# Patient Record
Sex: Male | Born: 1945
Health system: Southern US, Community
[De-identification: ages and names within clinical notes are randomized; demographics above are authoritative.]

## PROBLEM LIST (undated history)

## (undated) DIAGNOSIS — M8580 Other specified disorders of bone density and structure, unspecified site: Secondary | ICD-10-CM

## (undated) DIAGNOSIS — I10 Essential (primary) hypertension: Secondary | ICD-10-CM

## (undated) DIAGNOSIS — M898X9 Other specified disorders of bone, unspecified site: Secondary | ICD-10-CM

## (undated) DIAGNOSIS — M1990 Unspecified osteoarthritis, unspecified site: Secondary | ICD-10-CM

## (undated) HISTORY — DX: Unspecified osteoarthritis, unspecified site: M19.90

## (undated) HISTORY — PX: EYE SURGERY: SHX253

## (undated) HISTORY — DX: Other specified disorders of bone, unspecified site: M89.8X9

## (undated) HISTORY — DX: Other specified disorders of bone density and structure, unspecified site: M85.80

---

## 2004-05-24 ENCOUNTER — Ambulatory Visit: Payer: Self-pay | Admitting: Ophthalmology

## 2007-04-30 ENCOUNTER — Ambulatory Visit: Payer: Self-pay | Admitting: Ophthalmology

## 2011-10-08 ENCOUNTER — Ambulatory Visit: Payer: Self-pay | Admitting: Internal Medicine

## 2011-10-08 IMAGING — US US EXTREM LOW VENOUS*L*
1 series · 14 of 24 positions shown · non-contrast
Comparison: none

REASON FOR EXAM: STAT CR [PHONE_NUMBER] good until 7pm Left leg pain
cellulitis edema Eval for DVT
COMMENTS:

[Series 1: us extrem low venous*left* · 0.10mm/px · 14 of 43 slices shown]
[im 1/43]
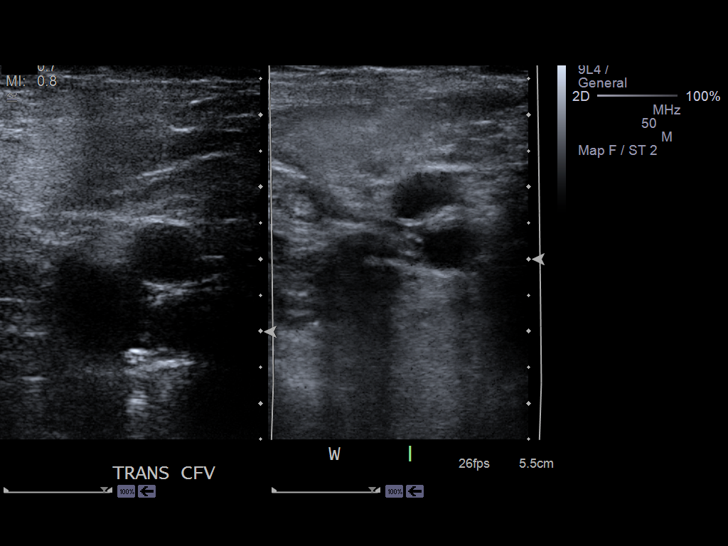
[im 4/43]
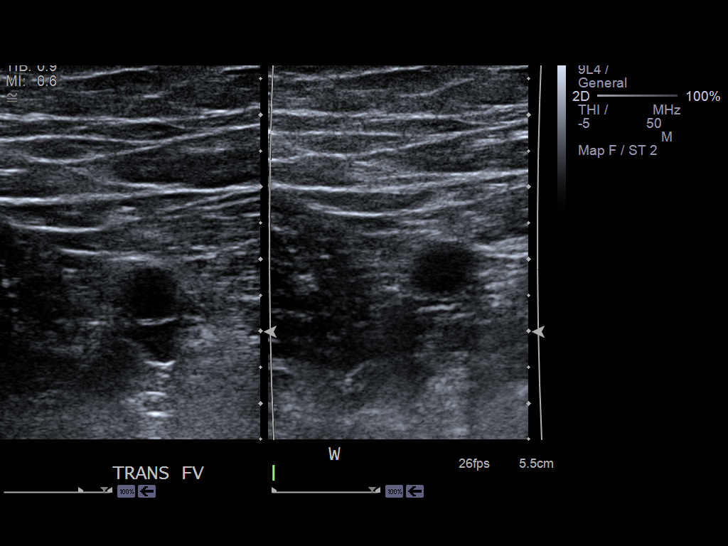
[im 8/43]
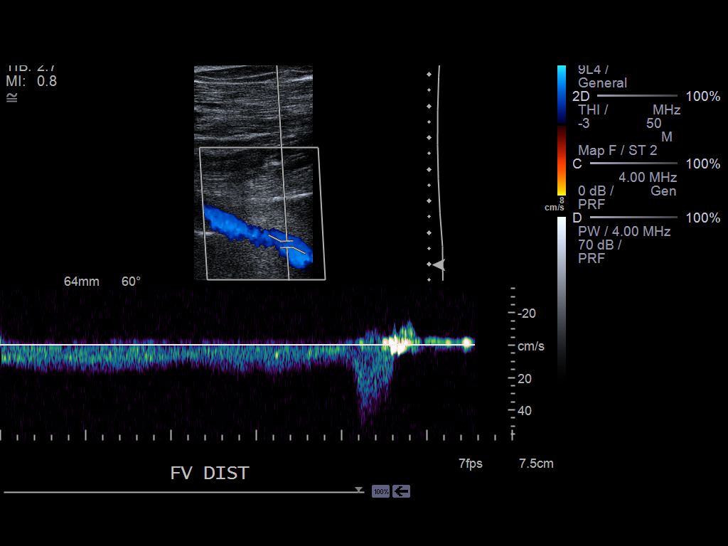
[im 11/43]
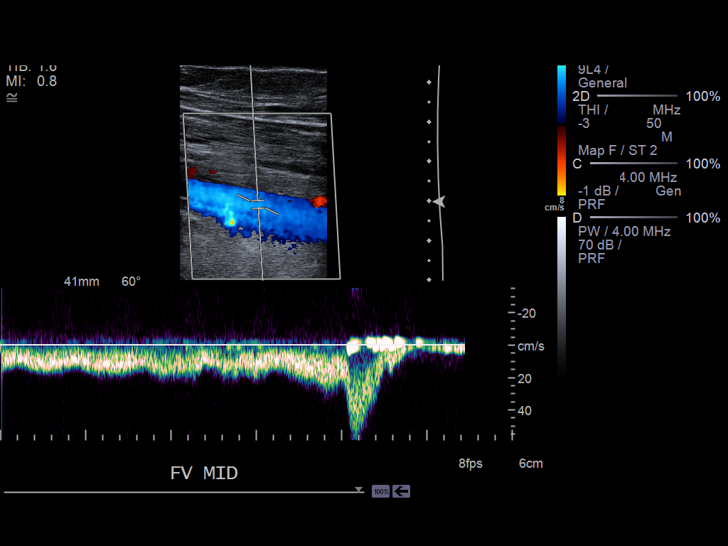
[im 13/43]
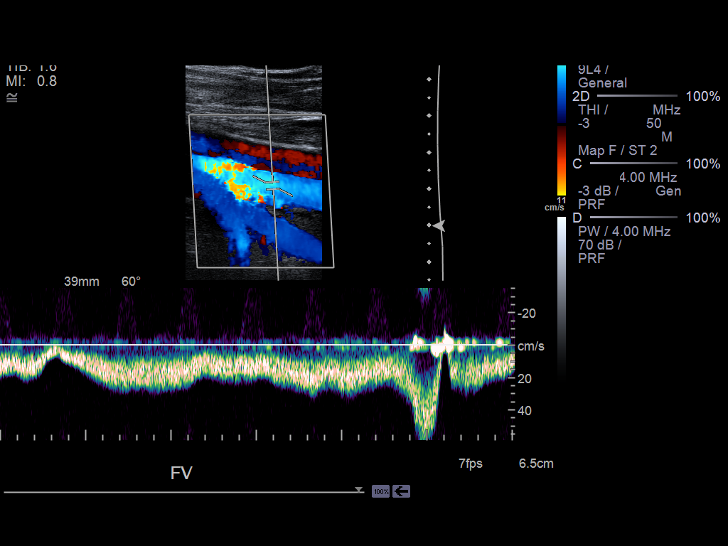
[im 17/43]
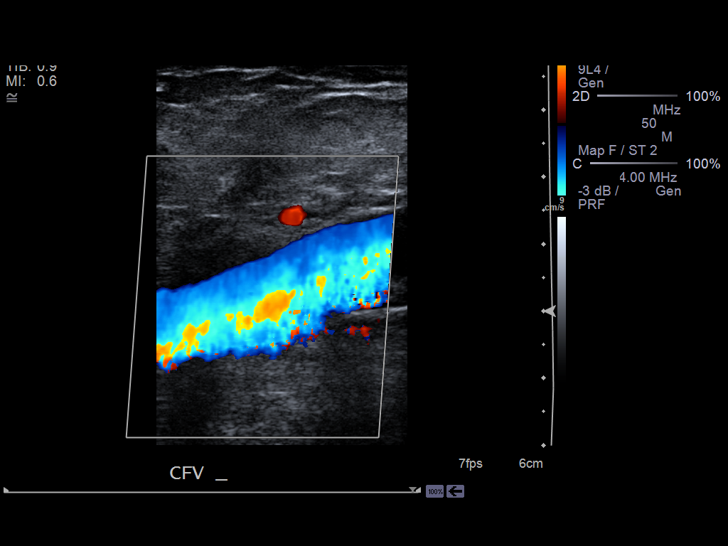
[im 21/43]
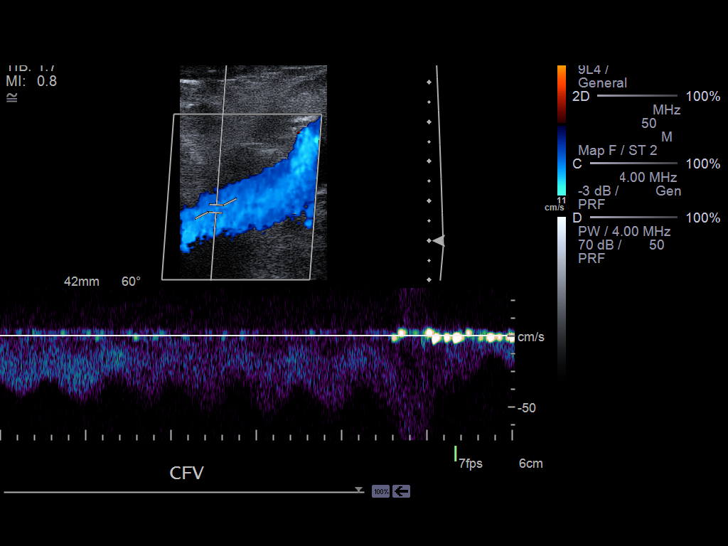
[im 22/43]
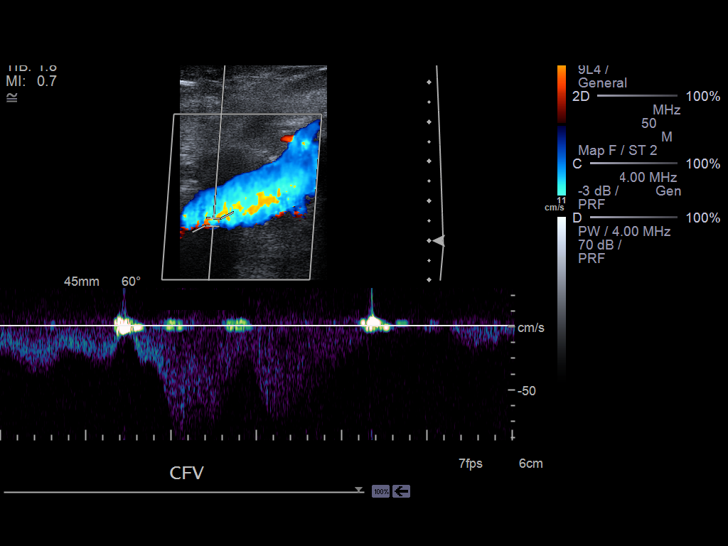
[im 26/43]
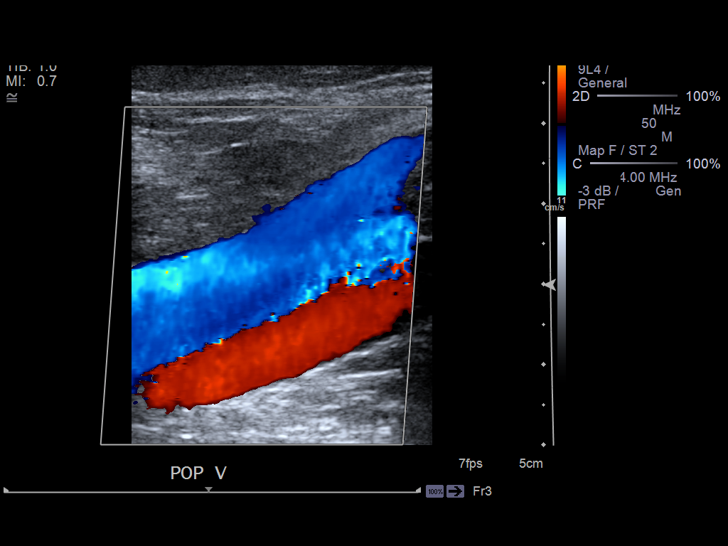
[im 30/43]
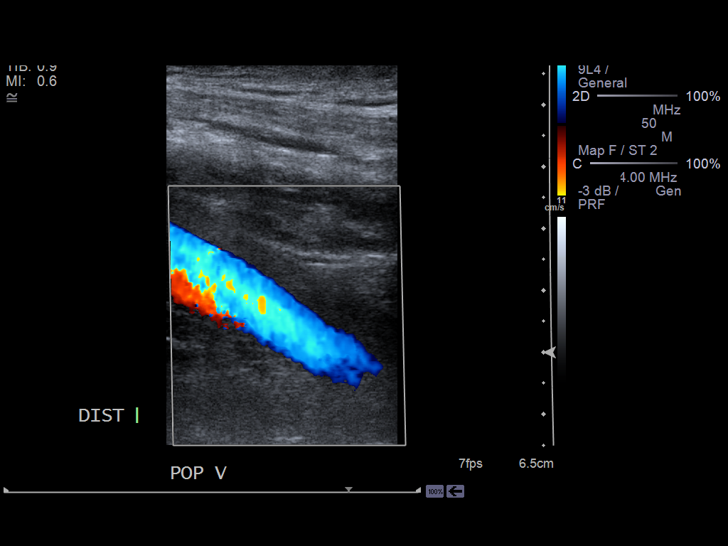
[im 33/43]
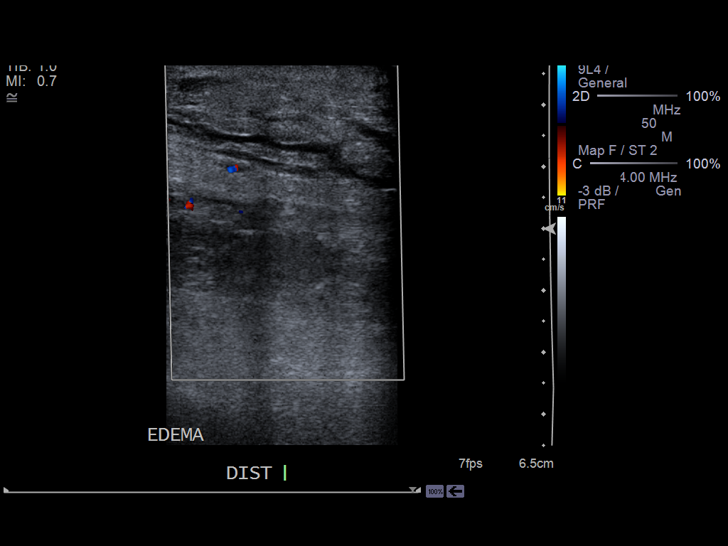
[im 35/43]
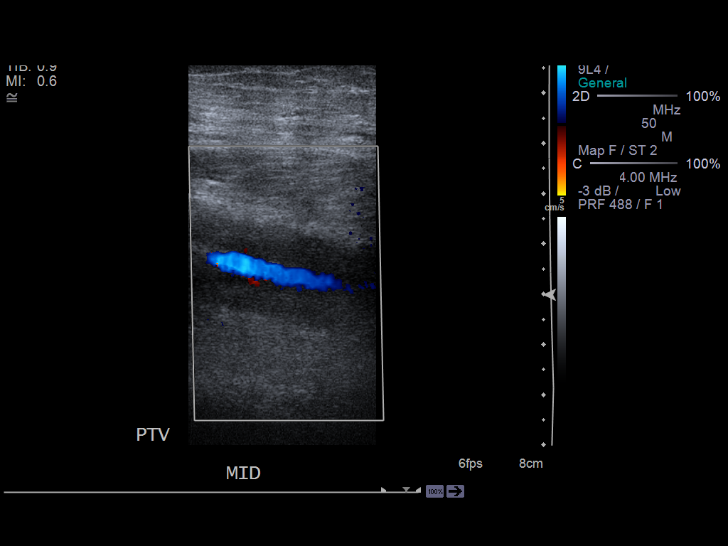
[im 39/43]
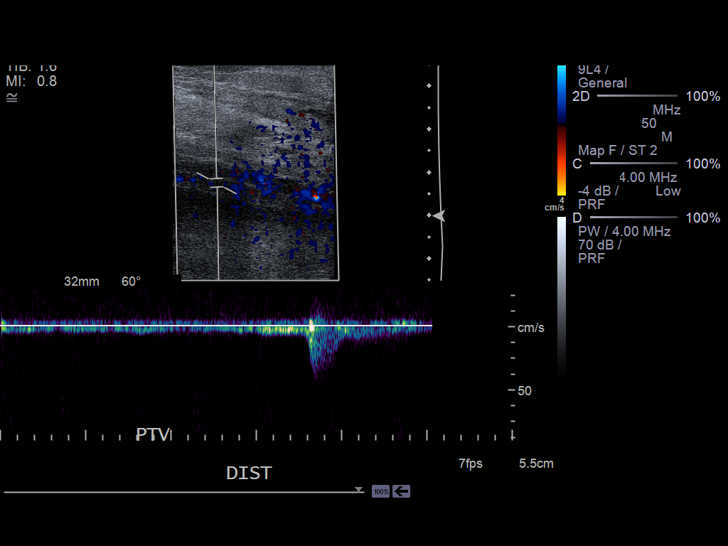
[im 43/43]
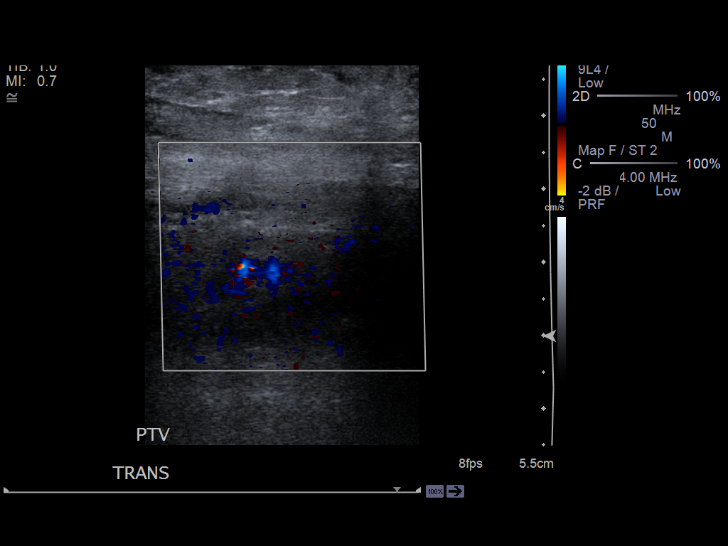

[14 of 24 positions shown; findings below may reference images not displayed]

PROCEDURE:     US  - US DOPPLER LOW EXTR LEFT  - [DATE]  [DATE]

RESULT:     Technique: Gray scale, Duplex color flow and SPECTRAL waveform
imaging was performed of the deep venous structures of the LEFT lower
extremity.

There is not evidence of increased echogenicity, non- compressibility,
abnormal waveform or abnormal grayscale flow with the interrogated deep
venous structures of the LEFT lower extremity. There is appropriate response
to Valsalva and augmentation within the interrogated vessels.
IMPRESSION: 1. No sonographic evidence of a deep venous thrombus within the interrogated
vessels of the LEFT lower extremity.

## 2017-12-24 DIAGNOSIS — K429 Umbilical hernia without obstruction or gangrene: Secondary | ICD-10-CM | POA: Insufficient documentation

## 2017-12-24 DIAGNOSIS — I872 Venous insufficiency (chronic) (peripheral): Secondary | ICD-10-CM | POA: Insufficient documentation

## 2017-12-24 DIAGNOSIS — I10 Essential (primary) hypertension: Secondary | ICD-10-CM | POA: Insufficient documentation

## 2018-02-05 ENCOUNTER — Other Ambulatory Visit (INDEPENDENT_AMBULATORY_CARE_PROVIDER_SITE_OTHER): Payer: Self-pay | Admitting: Nurse Practitioner

## 2018-02-05 ENCOUNTER — Other Ambulatory Visit (INDEPENDENT_AMBULATORY_CARE_PROVIDER_SITE_OTHER): Payer: Self-pay | Admitting: Vascular Surgery

## 2018-02-05 ENCOUNTER — Other Ambulatory Visit (INDEPENDENT_AMBULATORY_CARE_PROVIDER_SITE_OTHER): Payer: Self-pay | Admitting: Podiatry

## 2018-02-05 DIAGNOSIS — L97919 Non-pressure chronic ulcer of unspecified part of right lower leg with unspecified severity: Secondary | ICD-10-CM

## 2018-02-05 DIAGNOSIS — L97929 Non-pressure chronic ulcer of unspecified part of left lower leg with unspecified severity: Secondary | ICD-10-CM

## 2018-02-05 DIAGNOSIS — R0989 Other specified symptoms and signs involving the circulatory and respiratory systems: Secondary | ICD-10-CM

## 2018-02-05 DIAGNOSIS — I83029 Varicose veins of left lower extremity with ulcer of unspecified site: Secondary | ICD-10-CM

## 2018-02-07 ENCOUNTER — Ambulatory Visit (INDEPENDENT_AMBULATORY_CARE_PROVIDER_SITE_OTHER): Payer: Medicare Other

## 2018-02-07 ENCOUNTER — Ambulatory Visit (INDEPENDENT_AMBULATORY_CARE_PROVIDER_SITE_OTHER): Payer: Medicare Other | Admitting: Nurse Practitioner

## 2018-02-07 ENCOUNTER — Encounter (INDEPENDENT_AMBULATORY_CARE_PROVIDER_SITE_OTHER): Payer: Self-pay | Admitting: Nurse Practitioner

## 2018-02-07 VITALS — BP 168/71 | HR 86 | Resp 19 | Ht 71.0 in | Wt 223.0 lb

## 2018-02-07 DIAGNOSIS — R0989 Other specified symptoms and signs involving the circulatory and respiratory systems: Secondary | ICD-10-CM

## 2018-02-07 DIAGNOSIS — I10 Essential (primary) hypertension: Secondary | ICD-10-CM | POA: Diagnosis not present

## 2018-02-07 DIAGNOSIS — I83029 Varicose veins of left lower extremity with ulcer of unspecified site: Secondary | ICD-10-CM

## 2018-02-07 DIAGNOSIS — I83023 Varicose veins of left lower extremity with ulcer of ankle: Secondary | ICD-10-CM

## 2018-02-07 DIAGNOSIS — I872 Venous insufficiency (chronic) (peripheral): Secondary | ICD-10-CM

## 2018-02-07 DIAGNOSIS — L97329 Non-pressure chronic ulcer of left ankle with unspecified severity: Secondary | ICD-10-CM | POA: Diagnosis not present

## 2018-02-07 DIAGNOSIS — L97929 Non-pressure chronic ulcer of unspecified part of left lower leg with unspecified severity: Secondary | ICD-10-CM

## 2018-02-11 ENCOUNTER — Encounter (INDEPENDENT_AMBULATORY_CARE_PROVIDER_SITE_OTHER): Payer: Self-pay | Admitting: Nurse Practitioner

## 2018-02-11 DIAGNOSIS — I83023 Varicose veins of left lower extremity with ulcer of ankle: Secondary | ICD-10-CM | POA: Insufficient documentation

## 2018-02-11 DIAGNOSIS — L97329 Non-pressure chronic ulcer of left ankle with unspecified severity: Secondary | ICD-10-CM | POA: Insufficient documentation

## 2018-02-11 NOTE — Progress Notes (Signed)
Subjective:    Patient ID: Brandon Hooper, male    DOB: 07-27-1945, 72 y.o.   MRN: [id number] Chief Complaint  Patient presents with  . New Patient (Initial Visit)    ABI consult    HPI  Brandon Hooper is a 72 y.o. male that is seen for evaluation of leg pain and swelling associated with new onset ulceration. The patient first noticed the swelling remotely. The swelling is associated with pain and discoloration. The pain and swelling worsens with prolonged dependency and improves with elevation. The pain is unrelated to activity.  The patient notes that in the morning the legs are better but the leg symptoms worsened throughout the course of the day. The patient has also noted a progressive worsening of the discoloration in the ankle and shin area.   The patient has had a previous history of venous ulceration.  He recalls that he has had venous ulcerations of the right lower extremity in 1989.  He had a subsequent ulceration in 2013 on the left lower extremity and his most recent being in 2019.  He states that he is worn bilateral medical grade 1 compression stockings since his ulceration in 2013  The patient notes that an ulcer has developed acutely without specific trauma and since it occurred it has been very slow to heal.  There is a moderate amount of drainage associated with the open area.  The wound is also very painful.  The patient states that he utilize unna wraps to help heal his most recent ulceration and 2016.  However subsequently after healing he had a another ulceration.  The patient denies claudication symptoms or rest pain symptoms.  The patient denies DJD and LS spine disease.  The patient has not had any past angiography, interventions or vascular surgery.  Elevation makes the leg symptoms better, dependency makes them much worse. The patient denies any recent changes in medications.  The patient denies a history of DVT or PE. There is no prior history of  phlebitis. There is no history of primary lymphedema.  No history of malignancies. No history of trauma or groin or pelvic surgery. There is no history of radiation treatment to the groin or pelvis   The patient underwent bilateral ABIs today which revealed a right ABI of 1.41 with a TBI of 0.85.  There is a left ABI of 1.27 with a TBI of 0.76.  The right tibial waveforms are triphasic.  The left anterior tibial artery has biphasic waveforms and the posterior tibial artery has triphasic waveforms.    Past Medical History:  Diagnosis Date  . Arthritis   . Bone loss     Past Surgical History:  Procedure Laterality Date  . EYE SURGERY Bilateral    Feb 2006 and then in Jan 2009    Social History   Socioeconomic History  . Marital status: Married    Spouse name: Not on file  . Number of children: Not on file  . Years of education: Not on file  . Highest education level: Not on file  Occupational History  . Not on file  Social Needs  . Financial resource strain: Not on file  . Food insecurity:    Worry: Not on file    Inability: Not on file  . Transportation needs:    Medical: Not on file    Non-medical: Not on file  Tobacco Use  . Smoking status: Never Smoker  . Smokeless tobacco: Never Used  Substance and Sexual Activity  .  Alcohol use: Not Currently  . Drug use: Not on file  . Sexual activity: Not on file  Lifestyle  . Physical activity:    Days per week: Not on file    Minutes per session: Not on file  . Stress: Not on file  Relationships  . Social connections:    Talks on phone: Not on file    Gets together: Not on file    Attends religious service: Not on file    Active member of club or organization: Not on file    Attends meetings of clubs or organizations: Not on file    Relationship status: Not on file  . Intimate partner violence:    Fear of current or ex partner: Not on file    Emotionally abused: Not on file    Physically abused: Not on file     Forced sexual activity: Not on file  Other Topics Concern  . Not on file  Social History Narrative  . Not on file    Family History  Problem Relation Age of Onset  . Diabetes Father     Allergies  Allergen Reactions  . Erythromycin Hives  . Ofloxacin     Other reaction(s): Other (See Comments) "Arthritic pain". The patient reported 5 years of left hip pain when he last took ofloxacin. Likely could try another fluoroquinolone.   . Cephalexin Rash    The patient previously tolerated cephalexin in 2013 and reported in 2019 that he developed redness and itching of his knees while on cephalexin. Not entirely clear it was a true allergic reaction.   . Clindamycin Hcl Rash  . Doxycycline Calcium Rash  . Sulfamethoxazole-Trimethoprim Rash  . Tetracycline Rash     Review of Systems   Review of Systems: Negative Unless Checked Constitutional: [] Weight loss  [] Fever  [] Chills Cardiac: [] Chest pain   []  Atrial Fibrillation  [] Palpitations   [] Shortness of breath when laying flat   [] Shortness of breath with exertion. Vascular:  [] Pain in legs with walking   [] Pain in legs with standing  [] History of DVT   [] Phlebitis   [x] Swelling in legs   [x] Varicose veins   [] Non-healing ulcers Pulmonary:   [] Uses home oxygen   [] Productive cough   [] Hemoptysis   [] Wheeze  [] COPD   [] Asthma Neurologic:  [] Dizziness   [] Seizures   [] History of stroke   [] History of TIA  [] Aphasia   [] Vissual changes   [] Weakness or numbness in arm   [] Weakness or numbness in leg Musculoskeletal:   [] Joint swelling   [] Joint pain   [] Low back pain  []  History of Knee Replacement Hematologic:  [] Easy bruising  [] Easy bleeding   [] Hypercoagulable state   [] Anemic Gastrointestinal:  [] Diarrhea   [] Vomiting  [] Gastroesophageal reflux/heartburn   [] Difficulty swallowing. Genitourinary:  [] Chronic kidney disease   [] Difficult urination  [] Anuric   [] Blood in urine Skin:  [] Rashes   [x] Ulcers  Psychological:  [] History of  anxiety   []  History of major depression  []  Memory Difficulties     Objective:   Physical Exam  BP (!) 168/71 (BP Location: Right Arm, Patient Position: Sitting)   Pulse 86   Resp 19   Ht 5\' 11"  (1.803 m)   Wt 223 lb (101.2 kg)   BMI 31.10 kg/m   Gen: WD/WN, NAD Head: [place]/AT, No temporalis wasting.  Ear/Nose/Throat: Hearing grossly intact, nares w/o erythema or drainage Eyes: PER, EOMI, sclera nonicteric.  Neck: Supple, no masses.  No JVD.  Pulmonary:  Good air movement, no use of accessory muscles.  Cardiac: RRR Vascular: 2-3+ edema of the left leg with severe venous changes of the left leg.  Venous ulcer noted in the ankle area on the left, noninfected  Venous stasis dermatitis with ulcers present on the left Vessel Right Left  Radial Palpable Palpable  Dorsalis Pedis Palpable Palpable  Posterior Tibial Palpable Palpable   Gastrointestinal: soft, non-distended. No guarding/no peritoneal signs.  Musculoskeletal: M/S 5/5 throughout.  No deformity or atrophy.  Neurologic: Pain and light touch intact in extremities.  Symmetrical.  Speech is fluent. Motor exam as listed above. Psychiatric: Judgment intact, Mood & affect appropriate for pt's clinical situation. Dermatologic: No Venous rashes. No Ulcers Noted.  No changes consistent with cellulitis. Lymph : No Cervical lymphadenopathy, no lichenification or skin changes of chronic lymphedema.      Assessment & Plan:   1. Venous ulcer of ankle, left (HCC) No surgery or intervention at this point in time.    I have had a long discussion with the patient regarding venous insufficiency and why it  causes symptoms, specifically venous ulceration . I have discussed with the patient the chronic skin changes that accompany venous insufficiency and the long term sequela such as infection and recurring  Ulceration.  In addition, behavioral modification including several periods of elevation of the lower extremities during the day will  be continued. Achieving a position with the ankles at heart level was stressed to the patient  The patient is instructed to begin routine exercise, especially walking on a daily basis  Patient should undergo duplex ultrasound of the venous system to ensure that DVT or reflux is not present.  Following the review of the ultrasound the patient will follow up in one week to reassess the degree of swelling and the control that Unna therapy is offering.   The patient can be assessed for graduated compression stockings or wraps as well as a Lymph Pump once the ulcers are healed.  - VAS [place] LOWER EXTREMITY VENOUS REFLUX; Future  2. Essential hypertension Continue antihypertensive medications as already ordered, these medications have been reviewed and there are no changes at this time.   3. Venous insufficiency See above  ABI test today suggest that his recurrent ulcerations are due to venous disease and not arterial disease.  We will follow the plan outlined above.  Current Outpatient Medications on File Prior to Visit  Medication Sig Dispense Refill  . diphenhydrAMINE (BENADRYL) 25 MG tablet Take 25 mg by mouth every 6 (six) hours as needed.    Marland Kitchen guaiFENesin (MUCINEX) 600 MG 12 hr tablet Take by mouth 2 (two) times daily.     . pseudoephedrine (SUDAFED) 120 MG 12 hr tablet Take 120 mg by mouth daily as needed.      No current facility-administered medications on file prior to visit.     There are no Patient Instructions on file for this visit. No follow-ups on file.   Georgiana Spinner, NP  This note was completed with Office manager.  Any errors are purely unintentional.

## 2018-02-26 ENCOUNTER — Ambulatory Visit (INDEPENDENT_AMBULATORY_CARE_PROVIDER_SITE_OTHER): Payer: Medicare Other

## 2018-02-26 ENCOUNTER — Ambulatory Visit (INDEPENDENT_AMBULATORY_CARE_PROVIDER_SITE_OTHER): Payer: Medicare Other | Admitting: Nurse Practitioner

## 2018-02-26 ENCOUNTER — Encounter (INDEPENDENT_AMBULATORY_CARE_PROVIDER_SITE_OTHER): Payer: Medicare Other

## 2018-02-26 ENCOUNTER — Encounter (INDEPENDENT_AMBULATORY_CARE_PROVIDER_SITE_OTHER): Payer: Self-pay | Admitting: Nurse Practitioner

## 2018-02-26 VITALS — BP 156/75 | HR 81 | Resp 16 | Ht 70.0 in | Wt 222.0 lb

## 2018-02-26 DIAGNOSIS — L97329 Non-pressure chronic ulcer of left ankle with unspecified severity: Secondary | ICD-10-CM

## 2018-02-26 DIAGNOSIS — I872 Venous insufficiency (chronic) (peripheral): Secondary | ICD-10-CM | POA: Diagnosis not present

## 2018-02-26 DIAGNOSIS — I83023 Varicose veins of left lower extremity with ulcer of ankle: Secondary | ICD-10-CM | POA: Diagnosis not present

## 2018-02-26 DIAGNOSIS — I10 Essential (primary) hypertension: Secondary | ICD-10-CM | POA: Diagnosis not present

## 2018-02-26 NOTE — Progress Notes (Signed)
Subjective:    Patient ID: Brandon Hooper, male    DOB: [birth year], 72 y.o.   MRN: [id number] Chief Complaint  Patient presents with  . Follow-up    ultrasound follow up    HPI  Brandon Hooper is a 72 y.o. male that presents today for evaluation of swelling of his left lower extremity.  He has a venous ulceration that has been recurrent in nature.  He states that he originally had a venous ulcer in May and did Unna wrap therapy with Dr. Orland Jarred.  He stated that his wound healed however in August it resumed.  The patient previously had bilateral ABIs which were normal.  The patient is currently doing a dressing to the wound site and wearing medical grade 1 compression stockings 20 to 30 mmHg.  Despite the conservative therapy he still has continued weeping and ulceration of his left lower extremity.  He does note that it is an ulcer that developed acutely without specific trauma and it is been very slow to heal.  There is a moderate amount of drainage with open area.  The wound is also very painful.  Patient underwent bilateral venous reflux study today which revealed reflux in the bilateral common femoral veins.  There is no DVT present no superficial venous thrombosis present. Past Medical History:  Diagnosis Date  . Arthritis   . Bone loss     Past Surgical History:  Procedure Laterality Date  . EYE SURGERY Bilateral    Feb 2006 and then in Jan 2009    Social History   Socioeconomic History  . Marital status: Married    Spouse name: Not on file  . Number of children: Not on file  . Years of education: Not on file  . Highest education level: Not on file  Occupational History  . Not on file  Social Needs  . Financial resource strain: Not on file  . Food insecurity:    Worry: Not on file    Inability: Not on file  . Transportation needs:    Medical: Not on file    Non-medical: Not on file  Tobacco Use  . Smoking status: Never Smoker  . Smokeless tobacco: Never Used   Substance and Sexual Activity  . Alcohol use: Not Currently  . Drug use: Not on file  . Sexual activity: Not on file  Lifestyle  . Physical activity:    Days per week: Not on file    Minutes per session: Not on file  . Stress: Not on file  Relationships  . Social connections:    Talks on phone: Not on file    Gets together: Not on file    Attends religious service: Not on file    Active member of club or organization: Not on file    Attends meetings of clubs or organizations: Not on file    Relationship status: Not on file  . Intimate partner violence:    Fear of current or ex partner: Not on file    Emotionally abused: Not on file    Physically abused: Not on file    Forced sexual activity: Not on file  Other Topics Concern  . Not on file  Social History Narrative  . Not on file    Family History  Problem Relation Age of Onset  . Diabetes Father     Allergies  Allergen Reactions  . Erythromycin Hives  . Ofloxacin     Other reaction(s): Other (See Comments) "Arthritic pain".  The patient reported 5 years of left hip pain when he last took ofloxacin. Likely could try another fluoroquinolone.   . Cephalexin Rash    The patient previously tolerated cephalexin in 2013 and reported in 2019 that he developed redness and itching of his knees while on cephalexin. Not entirely clear it was a true allergic reaction.   . Clindamycin Hcl Rash  . Doxycycline Calcium Rash  . Sulfamethoxazole-Trimethoprim Rash  . Tetracycline Rash     Review of Systems   Review of Systems: Negative Unless Checked Constitutional: [] Weight loss  [] Fever  [] Chills Cardiac: [] Chest pain   []  Atrial Fibrillation  [] Palpitations   [] Shortness of breath when laying flat   [] Shortness of breath with exertion. Vascular:  [] Pain in legs with walking   [] Pain in legs with standing  [] History of DVT   [] Phlebitis   [x] Swelling in legs   [] Varicose veins   [x] Non-healing ulcers Pulmonary:   [] Uses home  oxygen   [] Productive cough   [] Hemoptysis   [] Wheeze  [] COPD   [] Asthma Neurologic:  [] Dizziness   [] Seizures   [] History of stroke   [] History of TIA  [] Aphasia   [] Vissual changes   [] Weakness or numbness in arm   [] Weakness or numbness in leg Musculoskeletal:   [] Joint swelling   [] Joint pain   [] Low back pain  []  History of Knee Replacement Hematologic:  [] Easy bruising  [] Easy bleeding   [] Hypercoagulable state   [] Anemic Gastrointestinal:  [] Diarrhea   [] Vomiting  [] Gastroesophageal reflux/heartburn   [] Difficulty swallowing. Genitourinary:  [] Chronic kidney disease   [] Difficult urination  [] Anuric   [] Blood in urine Skin:  [] Rashes   [] Ulcers  Psychological:  [] History of anxiety   []  History of major depression  []  Memory Difficulties     Objective:   Physical Exam  BP (!) 156/75 (BP Location: Right Arm)   Pulse 81   Resp 16   Ht 5\' 10"  (1.778 m)   Wt 222 lb (100.7 kg)   BMI 31.85 kg/m   Gen: WD/WN, NAD Head: [place]/AT, No temporalis wasting.  Ear/Nose/Throat: Hearing grossly intact, nares w/o erythema or drainage Eyes: PER, EOMI, sclera nonicteric.  Neck: Supple, no masses.  No JVD.  Pulmonary:  Good air movement, no use of accessory muscles.  Cardiac: RRR Vascular: venous ulcer on left lower extremity.  3+ pitting edema on both lower extremity. Vessel Right Left  Radial Palpable Palpable  Dorsalis Pedis Palpable Palpable  Posterior Tibial Palpable Palpable   Gastrointestinal: soft, non-distended. No guarding/no peritoneal signs.  Musculoskeletal: M/S 5/5 throughout.  No deformity or atrophy.  Neurologic: Pain and light touch intact in extremities.  Symmetrical.  Speech is fluent. Motor exam as listed above. Psychiatric: Judgment intact, Mood & affect appropriate for pt's clinical situation. Dermatologic: No Venous rashes. No Ulcers Noted.  No changes consistent with cellulitis. Lymph : No Cervical lymphadenopathy, no lichenification or skin changes of chronic  lymphedema.      Assessment & Plan:   1. Venous ulcer of ankle, left (HCC) No surgery or intervention at this point in time.    I have had a long discussion with the patient regarding venous insufficiency and why it  causes symptoms, specifically venous ulceration . I have discussed with the patient the chronic skin changes that accompany venous insufficiency and the long term sequela such as infection and recurring  ulceration.  Patient will be placed in [employer] which will be changed weekly drainage permitting.  In addition, behavioral modification  including several periods of elevation of the lower extremities during the day will be continued. Achieving a position with the ankles at heart level was stressed to the patient  The patient is instructed to begin routine exercise, especially walking on a daily basis   2. Essential hypertension Continue antihypertensive medications as already ordered, these medications have been reviewed and there are no changes at this time.   3. Venous insufficiency Had a long discussion with the wife and the patient in regards to chronic venous insufficiency and leg swelling.  Following Unna wrap therapy and wound healing we have discussed utilizing updated compression stockings.  He endorses that he has 2 pairs one which is new and 1 of which he has had since 2013.  They are both 20 to 30 mmHg, but he does endorse that one is definitely tighter.  We also spoke about the possibility of lymphedema pump if we find that compression stockings are unable to control the edema alone.   Current Outpatient Medications on File Prior to Visit  Medication Sig Dispense Refill  . diphenhydrAMINE (BENADRYL) 25 MG tablet Take 25 mg by mouth every 6 (six) hours as needed.    Marland Kitchen. guaiFENesin (MUCINEX) 600 MG 12 hr tablet Take by mouth 2 (two) times daily.     . pseudoephedrine (SUDAFED) 120 MG 12 hr tablet Take 120 mg by mouth daily as needed.      No current  facility-administered medications on file prior to visit.     There are no Patient Instructions on file for this visit. No follow-ups on file.   Georgiana SpinnerFallon E , NP  This note was completed with Office managerDragon Dictation.  Any errors are purely unintentional.

## 2018-03-05 ENCOUNTER — Encounter (INDEPENDENT_AMBULATORY_CARE_PROVIDER_SITE_OTHER): Payer: Self-pay

## 2018-03-05 ENCOUNTER — Ambulatory Visit (INDEPENDENT_AMBULATORY_CARE_PROVIDER_SITE_OTHER): Payer: Medicare Other | Admitting: Nurse Practitioner

## 2018-03-05 VITALS — BP 144/71 | HR 80 | Resp 16 | Ht 70.0 in | Wt 226.0 lb

## 2018-03-05 DIAGNOSIS — I83023 Varicose veins of left lower extremity with ulcer of ankle: Secondary | ICD-10-CM | POA: Diagnosis not present

## 2018-03-05 DIAGNOSIS — L97329 Non-pressure chronic ulcer of left ankle with unspecified severity: Secondary | ICD-10-CM

## 2018-03-05 NOTE — Progress Notes (Signed)
History of Present Illness  There is no documented history at this time  Assessments & Plan   There are no diagnoses linked to this encounter.    Additional instructions  Subjective:  Patient presents with venous ulcer of the Left lower extremity.    Procedure:  3 layer unna wrap was placed Left lower extremity.   Plan:   Follow up in one week.  

## 2018-03-11 ENCOUNTER — Encounter (INDEPENDENT_AMBULATORY_CARE_PROVIDER_SITE_OTHER): Payer: Self-pay

## 2018-03-11 ENCOUNTER — Ambulatory Visit (INDEPENDENT_AMBULATORY_CARE_PROVIDER_SITE_OTHER): Payer: Medicare Other | Admitting: Nurse Practitioner

## 2018-03-11 VITALS — BP 161/71 | HR 73 | Resp 18 | Ht 71.0 in | Wt 223.0 lb

## 2018-03-11 DIAGNOSIS — I83023 Varicose veins of left lower extremity with ulcer of ankle: Secondary | ICD-10-CM | POA: Diagnosis not present

## 2018-03-11 DIAGNOSIS — L97329 Non-pressure chronic ulcer of left ankle with unspecified severity: Secondary | ICD-10-CM | POA: Diagnosis not present

## 2018-03-11 NOTE — Progress Notes (Signed)
History of Present Illness  There is no documented history at this time  Assessments & Plan   There are no diagnoses linked to this encounter.    Additional instructions  Subjective:  Patient presents with venous ulcer of the Left lower extremity.    Procedure:  3 layer unna wrap was placed Left lower extremity.   Plan:   Follow up in one week.  

## 2018-03-12 ENCOUNTER — Encounter (INDEPENDENT_AMBULATORY_CARE_PROVIDER_SITE_OTHER): Payer: Medicare Other

## 2018-03-19 ENCOUNTER — Ambulatory Visit (INDEPENDENT_AMBULATORY_CARE_PROVIDER_SITE_OTHER): Payer: Medicare Other | Admitting: Nurse Practitioner

## 2018-03-19 ENCOUNTER — Encounter (INDEPENDENT_AMBULATORY_CARE_PROVIDER_SITE_OTHER): Payer: Self-pay | Admitting: Nurse Practitioner

## 2018-03-19 VITALS — BP 148/71 | HR 87 | Resp 18 | Wt 223.0 lb

## 2018-03-19 DIAGNOSIS — I83023 Varicose veins of left lower extremity with ulcer of ankle: Secondary | ICD-10-CM | POA: Diagnosis not present

## 2018-03-19 DIAGNOSIS — L97329 Non-pressure chronic ulcer of left ankle with unspecified severity: Secondary | ICD-10-CM | POA: Diagnosis not present

## 2018-03-19 NOTE — Progress Notes (Signed)
History of Present Illness  There is no documented history at this time  Assessments & Plan   There are no diagnoses linked to this encounter.    Additional instructions  Subjective:  Patient presents with venous ulcer of the Left lower extremity.    Procedure:  3 layer unna wrap was placed Left lower extremity.   Plan:   Follow up in one week.  

## 2018-03-25 ENCOUNTER — Encounter (INDEPENDENT_AMBULATORY_CARE_PROVIDER_SITE_OTHER): Payer: Self-pay | Admitting: Nurse Practitioner

## 2018-03-25 ENCOUNTER — Ambulatory Visit (INDEPENDENT_AMBULATORY_CARE_PROVIDER_SITE_OTHER): Payer: Medicare Other | Admitting: Nurse Practitioner

## 2018-03-25 VITALS — BP 155/77 | HR 86 | Ht 68.0 in | Wt 224.0 lb

## 2018-03-25 DIAGNOSIS — I872 Venous insufficiency (chronic) (peripheral): Secondary | ICD-10-CM | POA: Diagnosis not present

## 2018-03-25 DIAGNOSIS — I83023 Varicose veins of left lower extremity with ulcer of ankle: Secondary | ICD-10-CM | POA: Diagnosis not present

## 2018-03-25 DIAGNOSIS — I10 Essential (primary) hypertension: Secondary | ICD-10-CM

## 2018-03-25 DIAGNOSIS — L97329 Non-pressure chronic ulcer of left ankle with unspecified severity: Secondary | ICD-10-CM | POA: Diagnosis not present

## 2018-03-25 NOTE — Progress Notes (Signed)
Subjective:    Patient ID: Brandon Hooper, male    DOB: [birth year], 72 y.o.   MRN: [id number] Chief Complaint  Patient presents with  . Follow-up    unna check    HPI  Brandon Hooper is a 72 y.o. male Patient is seen for follow up evaluation of leg pain and swelling associated with venous ulceration. The patient was recently seen here and started on Unna boot therapy.  The patient states that he feels that the wounds are healing as well as the swelling is getting under better control.  From the last time that the patient was seen the wounds definitely do appear much better in size. The patient states that they have been elevating as much as possible. The patient denies any recent changes in medications.  The patient denies a history of DVT or PE. There is no prior history of phlebitis. There is no history of primary lymphedema.  No SOB or increased cough.  No sputum production.  No recent episodes of CHF exacerbation.   Past Medical History:  Diagnosis Date  . Arthritis   . Bone loss     Past Surgical History:  Procedure Laterality Date  . EYE SURGERY Bilateral    Feb 2006 and then in Jan 2009    Social History   Socioeconomic History  . Marital status: Married    Spouse name: Not on file  . Number of children: Not on file  . Years of education: Not on file  . Highest education level: Not on file  Occupational History  . Not on file  Social Needs  . Financial resource strain: Not on file  . Food insecurity:    Worry: Not on file    Inability: Not on file  . Transportation needs:    Medical: Not on file    Non-medical: Not on file  Tobacco Use  . Smoking status: Never Smoker  . Smokeless tobacco: Never Used  Substance and Sexual Activity  . Alcohol use: Not Currently  . Drug use: Not on file  . Sexual activity: Not on file  Lifestyle  . Physical activity:    Days per week: Not on file    Minutes per session: Not on file  . Stress: Not on file    Relationships  . Social connections:    Talks on phone: Not on file    Gets together: Not on file    Attends religious service: Not on file    Active member of club or organization: Not on file    Attends meetings of clubs or organizations: Not on file    Relationship status: Not on file  . Intimate partner violence:    Fear of current or ex partner: Not on file    Emotionally abused: Not on file    Physically abused: Not on file    Forced sexual activity: Not on file  Other Topics Concern  . Not on file  Social History Narrative  . Not on file    Family History  Problem Relation Age of Onset  . Diabetes Father     Allergies  Allergen Reactions  . Erythromycin Hives  . Ofloxacin     Other reaction(s): Other (See Comments) "Arthritic pain". The patient reported 5 years of left hip pain when he last took ofloxacin. Likely could try another fluoroquinolone.   . Cephalexin Rash    The patient previously tolerated cephalexin in 2013 and reported in 2019 that he developed redness and  itching of his knees while on cephalexin. Not entirely clear it was a true allergic reaction.   . Clindamycin Hcl Rash  . Doxycycline Calcium Rash  . Sulfamethoxazole-Trimethoprim Rash  . Tetracycline Rash     Review of Systems   Review of Systems: Negative Unless Checked Constitutional: [] Weight loss  [] Fever  [] Chills Cardiac: [] Chest pain   []  Atrial Fibrillation  [] Palpitations   [] Shortness of breath when laying flat   [] Shortness of breath with exertion. Vascular:  [] Pain in legs with walking   [] Pain in legs with standing  [] History of DVT   [] Phlebitis   [x] Swelling in legs   [x] Varicose veins   [] Non-healing ulcers Pulmonary:   [] Uses home oxygen   [] Productive cough   [] Hemoptysis   [] Wheeze  [] COPD   [] Asthma Neurologic:  [] Dizziness   [] Seizures   [] History of stroke   [] History of TIA  [] Aphasia   [] Vissual changes   [] Weakness or numbness in arm   [] Weakness or numbness in  leg Musculoskeletal:   [] Joint swelling   [] Joint pain   [] Low back pain  []  History of Knee Replacement Hematologic:  [] Easy bruising  [] Easy bleeding   [] Hypercoagulable state   [] Anemic Gastrointestinal:  [] Diarrhea   [] Vomiting  [] Gastroesophageal reflux/heartburn   [] Difficulty swallowing. Genitourinary:  [] Chronic kidney disease   [] Difficult urination  [] Anuric   [] Blood in urine Skin:  [] Rashes   [x] Ulcers  Psychological:  [] History of anxiety   []  History of major depression  []  Memory Difficulties     Objective:   Physical Exam  BP (!) 155/77 (BP Location: Left Arm)   Pulse 86   Ht 5\' 8"  (1.727 m)   Wt 224 lb (101.6 kg)   BMI 34.06 kg/m   Gen: WD/WN, NAD Head: [place]/AT, No temporalis wasting.  Ear/Nose/Throat: Hearing grossly intact, nares w/o erythema or drainage Eyes: PER, EOMI, sclera nonicteric.  Neck: Supple, no masses.  No JVD.  Pulmonary:  Good air movement, no use of accessory muscles.  Cardiac: RRR Vascular:  Evidence of old blister under arch of left foot.  Evidence of smaller scattered healing ulcerations along the area of left posterior calf. Vessel Right Left  Radial Palpable Palpable  Dorsalis Pedis Palpable Palpable  Posterior Tibial Palpable Palpable   Gastrointestinal: soft, non-distended. No guarding/no peritoneal signs.  Musculoskeletal: M/S 5/5 throughout.  No deformity or atrophy.  Neurologic: Pain and light touch intact in extremities.  Symmetrical.  Speech is fluent. Motor exam as listed above. Psychiatric: Judgment intact, Mood & affect appropriate for pt's clinical situation. Dermatologic: No Venous rashes. No Ulcers Noted.  No changes consistent with cellulitis. Lymph : No Cervical lymphadenopathy, no lichenification or skin changes of chronic lymphedema.      Assessment & Plan:   1. Venous ulcer of ankle, left (HCC) No surgery or intervention at this point in time.    I have had a long discussion with the patient regarding venous  insufficiency and why it  causes symptoms, specifically venous ulceration . I have discussed with the patient the chronic skin changes that accompany venous insufficiency and the long term sequela such as infection and recurring  ulceration.  Patient will be placed in [employer] which will be changed weekly drainage permitting.  In addition, behavioral modification including several periods of elevation of the lower extremities during the day will be continued. Achieving a position with the ankles at heart level was stressed to the patient  The patient is instructed to begin routine  exercise, especially walking on a daily basis  The patient will undergo weekly wraps for the next 4 weeks.  We will reevaluate the status of his wounds in 4 weeks determine if we can transition to medical grade 1 compression wraps or if the need for continued wrap therapy is necessary.  2. Essential hypertension Continue antihypertensive medications as already ordered, these medications have been reviewed and there are no changes at this time.   3. Venous insufficiency No surgery or intervention at this point in time.    I have reviewed my discussion with the patient regarding venous insufficiency and secondary lymph edema and why it  causes symptoms. I have discussed with the patient the chronic skin changes that accompany these problems and the long term sequela such as ulceration and infection.  Patient will continue wearing graduated compression stockings class 1 (20-30 mmHg) on a daily basis a prescription was given to the patient to keep this updated. The patient will  put the stockings on first thing in the morning and removing them in the evening. The patient is instructed specifically not to sleep in the stockings.  In addition, behavioral modification including elevation during the day will be continued.  Diet and salt restriction was also discussed.      Current Outpatient Medications on File Prior to  Visit  Medication Sig Dispense Refill  . diphenhydrAMINE (BENADRYL) 25 MG tablet Take 25 mg by mouth every 6 (six) hours as needed.    Marland Kitchen guaiFENesin (MUCINEX) 600 MG 12 hr tablet Take by mouth 2 (two) times daily.     . pseudoephedrine (SUDAFED) 120 MG 12 hr tablet Take 120 mg by mouth daily as needed.      No current facility-administered medications on file prior to visit.     There are no Patient Instructions on file for this visit. No follow-ups on file.   Georgiana Spinner, NP  This note was completed with Office manager.  Any errors are purely unintentional.

## 2018-03-26 ENCOUNTER — Ambulatory Visit (INDEPENDENT_AMBULATORY_CARE_PROVIDER_SITE_OTHER): Payer: Medicare Other | Admitting: Nurse Practitioner

## 2018-04-03 ENCOUNTER — Encounter (INDEPENDENT_AMBULATORY_CARE_PROVIDER_SITE_OTHER): Payer: Self-pay | Admitting: Nurse Practitioner

## 2018-04-03 ENCOUNTER — Ambulatory Visit (INDEPENDENT_AMBULATORY_CARE_PROVIDER_SITE_OTHER): Payer: Medicare Other | Admitting: Nurse Practitioner

## 2018-04-03 VITALS — BP 151/71 | HR 80 | Ht 68.0 in | Wt 225.2 lb

## 2018-04-03 DIAGNOSIS — L97329 Non-pressure chronic ulcer of left ankle with unspecified severity: Secondary | ICD-10-CM

## 2018-04-03 DIAGNOSIS — I83023 Varicose veins of left lower extremity with ulcer of ankle: Secondary | ICD-10-CM

## 2018-04-03 NOTE — Progress Notes (Signed)
History of Present Illness  There is no documented history at this time  Assessments & Plan   There are no diagnoses linked to this encounter.    Additional instructions  Subjective:  Patient presents with venous ulcer of the Left lower extremity.    Procedure:  3 layer unna wrap was placed Left lower extremity.   Plan:   Follow up in one week.  

## 2018-04-08 ENCOUNTER — Ambulatory Visit (INDEPENDENT_AMBULATORY_CARE_PROVIDER_SITE_OTHER): Payer: Medicare Other | Admitting: Nurse Practitioner

## 2018-04-08 ENCOUNTER — Encounter (INDEPENDENT_AMBULATORY_CARE_PROVIDER_SITE_OTHER): Payer: Self-pay

## 2018-04-08 VITALS — BP 155/73 | HR 81 | Resp 14 | Ht 68.0 in | Wt 225.2 lb

## 2018-04-08 DIAGNOSIS — L97329 Non-pressure chronic ulcer of left ankle with unspecified severity: Secondary | ICD-10-CM | POA: Diagnosis not present

## 2018-04-08 DIAGNOSIS — I83023 Varicose veins of left lower extremity with ulcer of ankle: Secondary | ICD-10-CM

## 2018-04-08 NOTE — Progress Notes (Signed)
History of Present Illness  There is no documented history at this time  Assessments & Plan   There are no diagnoses linked to this encounter.    Additional instructions  Subjective:  Patient presents with venous ulcer of the Left lower extremity.    Procedure:  3 layer unna wrap was placed Left lower extremity.   Plan:   Follow up in one week.  

## 2018-04-15 ENCOUNTER — Encounter (INDEPENDENT_AMBULATORY_CARE_PROVIDER_SITE_OTHER): Payer: Self-pay | Admitting: Nurse Practitioner

## 2018-04-15 ENCOUNTER — Ambulatory Visit (INDEPENDENT_AMBULATORY_CARE_PROVIDER_SITE_OTHER): Payer: Medicare Other | Admitting: Nurse Practitioner

## 2018-04-15 VITALS — BP 162/73 | HR 82 | Resp 18 | Ht 68.0 in | Wt 227.8 lb

## 2018-04-15 DIAGNOSIS — I83023 Varicose veins of left lower extremity with ulcer of ankle: Secondary | ICD-10-CM

## 2018-04-15 DIAGNOSIS — I10 Essential (primary) hypertension: Secondary | ICD-10-CM | POA: Diagnosis not present

## 2018-04-15 DIAGNOSIS — I872 Venous insufficiency (chronic) (peripheral): Secondary | ICD-10-CM | POA: Diagnosis not present

## 2018-04-15 DIAGNOSIS — L97329 Non-pressure chronic ulcer of left ankle with unspecified severity: Secondary | ICD-10-CM | POA: Diagnosis not present

## 2018-04-21 ENCOUNTER — Encounter (INDEPENDENT_AMBULATORY_CARE_PROVIDER_SITE_OTHER): Payer: Self-pay | Admitting: Nurse Practitioner

## 2018-04-21 ENCOUNTER — Other Ambulatory Visit (INDEPENDENT_AMBULATORY_CARE_PROVIDER_SITE_OTHER): Payer: Self-pay | Admitting: Nurse Practitioner

## 2018-04-21 NOTE — Progress Notes (Signed)
Subjective:    Patient ID: Brandon Hooper, male    DOB: 16-Jan-1946, 73 y.o.   MRN: [id number] Chief Complaint  Patient presents with  . Follow-up    UNNA BOOT    HPI  Brandon Hooper is a 73 y.o. male that presents today for evaluation of lower extremity wounds.  The patient reports tolerating Unna wrap therapy without issue.  His wounds appear nearly healed, with the exception of a few dime sized ulcerations.  Patient denies any fever, chills, nausea, vomiting or diarrhea.  Patient denies any chest pain or shortness of breath.  Patient denies any TIA-like symptoms or amaurosis fugax.  Past Medical History:  Diagnosis Date  . Arthritis   . Bone loss     Past Surgical History:  Procedure Laterality Date  . EYE SURGERY Bilateral    Feb 2006 and then in Jan 2009    Social History   Socioeconomic History  . Marital status: Married    Spouse name: Not on file  . Number of children: Not on file  . Years of education: Not on file  . Highest education level: Not on file  Occupational History  . Not on file  Social Needs  . Financial resource strain: Not on file  . Food insecurity:    Worry: Not on file    Inability: Not on file  . Transportation needs:    Medical: Not on file    Non-medical: Not on file  Tobacco Use  . Smoking status: Never Smoker  . Smokeless tobacco: Never Used  Substance and Sexual Activity  . Alcohol use: Not Currently  . Drug use: Not on file  . Sexual activity: Not on file  Lifestyle  . Physical activity:    Days per week: Not on file    Minutes per session: Not on file  . Stress: Not on file  Relationships  . Social connections:    Talks on phone: Not on file    Gets together: Not on file    Attends religious service: Not on file    Active member of club or organization: Not on file    Attends meetings of clubs or organizations: Not on file    Relationship status: Not on file  . Intimate partner violence:    Fear of current or ex  partner: Not on file    Emotionally abused: Not on file    Physically abused: Not on file    Forced sexual activity: Not on file  Other Topics Concern  . Not on file  Social History Narrative  . Not on file    Family History  Problem Relation Age of Onset  . Diabetes Father     Allergies  Allergen Reactions  . Erythromycin Hives  . Ofloxacin     Other reaction(s): Other (See Comments) "Arthritic pain". The patient reported 5 years of left hip pain when he last took ofloxacin. Likely could try another fluoroquinolone.   . Cephalexin Rash    The patient previously tolerated cephalexin in 2013 and reported in 2019 that he developed redness and itching of his knees while on cephalexin. Not entirely clear it was a true allergic reaction.   . Clindamycin Hcl Rash  . Doxycycline Calcium Rash  . Sulfamethoxazole-Trimethoprim Rash  . Tetracycline Rash     Review of Systems   Review of Systems: Negative Unless Checked Constitutional: [] Weight loss  [] Fever  [] Chills Cardiac: [] Chest pain   []  Atrial Fibrillation  [] Palpitations   []   Shortness of breath when laying flat   [] Shortness of breath with exertion. [] Shortness of breath at rest Vascular:  [] Pain in legs with walking   [] Pain in legs with standing [] Pain in legs when laying flat   [] Claudication    [] Pain in feet when laying flat    [] History of DVT   [] Phlebitis   [] Swelling in legs   [] Varicose veins   [] Non-healing ulcers Pulmonary:   [] Uses home oxygen   [] Productive cough   [] Hemoptysis   [] Wheeze  [] COPD   [] Asthma Neurologic:  [] Dizziness   [] Seizures  [] Blackouts [] History of stroke   [] History of TIA  [] Aphasia   [] Temporary Blindness   [] Weakness or numbness in arm   [] Weakness or numbness in leg Musculoskeletal:   [] Joint swelling   [] Joint pain   [] Low back pain  []  History of Knee Replacement [] Arthritis [] back Surgeries  []  Spinal Stenosis    Hematologic:  [] Easy bruising  [] Easy bleeding   [] Hypercoagulable state    [] Anemic Gastrointestinal:  [] Diarrhea   [] Vomiting  [] Gastroesophageal reflux/heartburn   [] Difficulty swallowing. [] Abdominal pain Genitourinary:  [] Chronic kidney disease   [] Difficult urination  [] Anuric   [] Blood in urine [] Frequent urination  [] Burning with urination   [] Hematuria Skin:  [] Rashes   [] Ulcers [] Wounds Psychological:  [] History of anxiety   []  History of major depression  []  Memory Difficulties     Objective:   Physical Exam  BP (!) 162/73 (BP Location: Right Arm, Patient Position: Sitting)   Pulse 82   Resp 18   Ht 5\' 8"  (1.727 m)   Wt 227 lb 12.8 oz (103.3 kg)   BMI 34.64 kg/m   Gen: WD/WN, NAD Head: [place]/AT, No temporalis wasting.  Ear/Nose/Throat: Hearing grossly intact, nares w/o erythema or drainage Eyes: PER, EOMI, sclera nonicteric.  Neck: Supple, no masses.  No JVD.  Pulmonary:  Good air movement, no use of accessory muscles.  Cardiac: RRR Vascular:  1+ soft edema, small ulcerations near ankle, smaller than a dime Vessel Right Left  Radial Palpable Palpable  Dorsalis Pedis Palpable Palpable  Posterior Tibial Palpable Palpable   Gastrointestinal: soft, non-distended. No guarding/no peritoneal signs.  Musculoskeletal: M/S 5/5 throughout.  No deformity or atrophy.  Neurologic: Pain and light touch intact in extremities.  Symmetrical.  Speech is fluent. Motor exam as listed above. Psychiatric: Judgment intact, Mood & affect appropriate for pt's clinical situation. Dermatologic:  Bilateral venous stasis rashes No changes consistent with cellulitis. Lymph : No Cervical lymphadenopathy, no lichenification or skin changes of chronic lymphedema.      Assessment & Plan:   1. Venous ulcer of ankle, left (HCC) Previous venous ulcer is very nearly healed.  The patient also had a blister on the dorsal surface of his foot.  This is also very nearly healed.  We will keep the patient in Unna wraps for approximately 4 more weeks.  Based on the size of the wounds it  is likely they will be healed at the end of this time.  The patient will also have time to obtain new medical grade 1 compression stockings during this time.  Patient and wife are in agreement with this plan.  2. Essential hypertension Continue antihypertensive medications as already ordered, these medications have been reviewed and there are no changes at this time.   3. Venous insufficiency No surgery or intervention at this point in time.    I have had a long discussion with the patient regarding venous insufficiency and why  it  causes symptoms, specifically venous ulceration . I have discussed with the patient the chronic skin changes that accompany venous insufficiency and the long term sequela such as infection and recurring  ulceration.  Patient will be placed in [employer] Boot[employer] which will be changed weekly drainage permitting.  In addition, behavioral modification including several periods of elevation of the lower extremities during the day will be continued. Achieving a position with the ankles at heart level was stressed to the patient  The patient is instructed to begin routine exercise, especially walking on a daily basis   The patient can be assessed for graduated compression stockings or wraps as well as a Lymph Pump once the ulcers are healed.    No current outpatient medications on file prior to visit.   No current facility-administered medications on file prior to visit.     There are no Patient Instructions on file for this visit. No follow-ups on file.   Georgiana SpinnerFallon E Zahriyah Joo, NP  This note was completed with Office managerDragon Dictation.  Any errors are purely unintentional.

## 2018-04-23 ENCOUNTER — Encounter (INDEPENDENT_AMBULATORY_CARE_PROVIDER_SITE_OTHER): Payer: Self-pay

## 2018-04-23 ENCOUNTER — Ambulatory Visit (INDEPENDENT_AMBULATORY_CARE_PROVIDER_SITE_OTHER): Payer: Medicare Other | Admitting: Vascular Surgery

## 2018-04-23 VITALS — BP 142/72 | HR 80 | Resp 16 | Ht 68.0 in | Wt 226.4 lb

## 2018-04-23 DIAGNOSIS — I872 Venous insufficiency (chronic) (peripheral): Secondary | ICD-10-CM

## 2018-04-23 DIAGNOSIS — L97329 Non-pressure chronic ulcer of left ankle with unspecified severity: Secondary | ICD-10-CM

## 2018-04-23 DIAGNOSIS — I83023 Varicose veins of left lower extremity with ulcer of ankle: Secondary | ICD-10-CM

## 2018-04-23 NOTE — Progress Notes (Signed)
History of Present Illness  There is no documented history at this time  Assessments & Plan   There are no diagnoses linked to this encounter.    Additional instructions  Subjective:  Patient presents with venous ulcer of the Left lower extremity.    Procedure:  3 layer unna wrap was placed Left lower extremity.   Plan:   Follow up in one week.  

## 2018-04-30 ENCOUNTER — Encounter (INDEPENDENT_AMBULATORY_CARE_PROVIDER_SITE_OTHER): Payer: Self-pay

## 2018-04-30 ENCOUNTER — Ambulatory Visit (INDEPENDENT_AMBULATORY_CARE_PROVIDER_SITE_OTHER): Payer: Medicare Other | Admitting: Nurse Practitioner

## 2018-04-30 VITALS — BP 146/72 | HR 77 | Resp 16 | Ht 68.0 in | Wt 227.0 lb

## 2018-04-30 DIAGNOSIS — L97329 Non-pressure chronic ulcer of left ankle with unspecified severity: Secondary | ICD-10-CM

## 2018-04-30 DIAGNOSIS — I83023 Varicose veins of left lower extremity with ulcer of ankle: Secondary | ICD-10-CM

## 2018-04-30 NOTE — Progress Notes (Signed)
History of Present Illness  There is no documented history at this time  Assessments & Plan   There are no diagnoses linked to this encounter.    Additional instructions  Subjective:  Patient presents with venous ulcer of the Left lower extremity.    Procedure:  3 layer unna wrap was placed Left lower extremity.   Plan:   Follow up in one week.  

## 2018-05-08 ENCOUNTER — Encounter (INDEPENDENT_AMBULATORY_CARE_PROVIDER_SITE_OTHER): Payer: Self-pay | Admitting: Vascular Surgery

## 2018-05-08 ENCOUNTER — Ambulatory Visit (INDEPENDENT_AMBULATORY_CARE_PROVIDER_SITE_OTHER): Payer: Medicare Other | Admitting: Vascular Surgery

## 2018-05-08 VITALS — BP 170/69 | HR 75 | Resp 16 | Ht 68.0 in | Wt 230.6 lb

## 2018-05-08 DIAGNOSIS — I83023 Varicose veins of left lower extremity with ulcer of ankle: Secondary | ICD-10-CM | POA: Diagnosis not present

## 2018-05-08 DIAGNOSIS — L97329 Non-pressure chronic ulcer of left ankle with unspecified severity: Secondary | ICD-10-CM | POA: Diagnosis not present

## 2018-05-08 DIAGNOSIS — I10 Essential (primary) hypertension: Secondary | ICD-10-CM

## 2018-05-08 DIAGNOSIS — I872 Venous insufficiency (chronic) (peripheral): Secondary | ICD-10-CM | POA: Diagnosis not present

## 2018-05-08 NOTE — Progress Notes (Signed)
MRN : [id number]  Brandon Hooper is a 73 y.o. ([birth year]) male who presents with chief complaint of  Chief Complaint  Patient presents with  . Follow-up  .  History of Present Illness:   Patient is seen for follow up evaluation of leg pain and swelling associated with venous ulceration. The patient was recently seen here and started on Unna boot therapy.  The swelling abruptly became much worse bilaterally and is associated with pain and discoloration. The pain and swelling worsens with prolonged dependency and improves with elevation.  The patient notes that in the morning the legs are better but the leg symptoms worsened throughout the course of the day. The patient has also noted a progressive worsening of the discoloration in the ankle and shin area.   The patient notes that an ulcer has developed acutely without specific trauma and since it occurred it has been very slow to heal.  There is a moderate amount of drainage associated with the open area.  The wound is also very painful.  The patient notes that they were not able to tolerate the Unna boot and removed it several days ago.  The patient states that they have been elevating as much as possible. The patient denies any recent changes in medications.  The patient denies a history of DVT or PE. There is no prior history of phlebitis. There is no history of primary lymphedema.  No SOB or increased cough.  No sputum production.  No recent episodes of CHF exacerbation.   No outpatient medications have been marked as taking for the 05/08/18 encounter (Office Visit) with Gilda Crease, Latina Craver, MD.    Past Medical History:  Diagnosis Date  . Arthritis   . Bone loss     Past Surgical History:  Procedure Laterality Date  . EYE SURGERY Bilateral    Feb 2006 and then in Jan 2009    Social History Social History   Tobacco Use  . Smoking status: Never Smoker  . Smokeless tobacco: Never Used  Substance Use Topics  . Alcohol  use: Not Currently  . Drug use: Not on file    Family History Family History  Problem Relation Age of Onset  . Diabetes Father     Allergies  Allergen Reactions  . Erythromycin Hives  . Ofloxacin     Other reaction(s): Other (See Comments) "Arthritic pain". The patient reported 5 years of left hip pain when he last took ofloxacin. Likely could try another fluoroquinolone.   . Cephalexin Rash    The patient previously tolerated cephalexin in 2013 and reported in 2019 that he developed redness and itching of his knees while on cephalexin. Not entirely clear it was a true allergic reaction.   . Clindamycin Hcl Rash  . Doxycycline Calcium Rash  . Sulfamethoxazole-Trimethoprim Rash  . Tetracycline Rash     REVIEW OF SYSTEMS (Negative unless checked)  Constitutional: [] Weight loss  [] Fever  [] Chills Cardiac: [] Chest pain   [] Chest pressure   [] Palpitations   [] Shortness of breath when laying flat   [] Shortness of breath with exertion. Vascular:  [] Pain in legs with walking   [] Pain in legs at rest  [] History of DVT   [] Phlebitis   [] Swelling in legs   [x] Varicose veins   [] Non-healing ulcers Pulmonary:   [] Uses home oxygen   [] Productive cough   [] Hemoptysis   [] Wheeze  [] COPD   [] Asthma Neurologic:  [] Dizziness   [] Seizures   [] History of stroke   [] History of  TIA  [] Aphasia   [] Vissual changes   [] Weakness or numbness in arm   [] Weakness or numbness in leg Musculoskeletal:   [] Joint swelling   [] Joint pain   [] Low back pain Hematologic:  [] Easy bruising  [] Easy bleeding   [] Hypercoagulable state   [] Anemic Gastrointestinal:  [] Diarrhea   [] Vomiting  [] Gastroesophageal reflux/heartburn   [] Difficulty swallowing. Genitourinary:  [] Chronic kidney disease   [] Difficult urination  [] Frequent urination   [] Blood in urine Skin:  [x] Rashes   [x] Ulcers  Psychological:  [] History of anxiety   []  History of major depression.  Physical Examination  Vitals:   05/08/18 1039  BP: (!) 170/69    Pulse: 75  Resp: 16  Weight: 230 lb 9.6 oz (104.6 kg)  Height: 5\' 8"  (1.727 m)   Body mass index is 35.06 kg/m. Gen: WD/WN, NAD Head: [place]/AT, No temporalis wasting.  Ear/Nose/Throat: Hearing grossly intact, nares w/o erythema or drainage Eyes: PER, EOMI, sclera nonicteric.  Neck: Supple, no large masses.   Pulmonary:  Good air movement, no audible wheezing bilaterally, no use of accessory muscles.  Cardiac: RRR, no JVD Vascular: 2-3+ edema of the left leg with severe venous changes of the left leg.  Venous ulcer noted in the ankle area on the left, noninfected Vessel Right Left  Radial Palpable Palpable  PT Palpable Palpable  DP Palpable Palpable  Gastrointestinal: Non-distended. No guarding/no peritoneal signs.  Musculoskeletal: M/S 5/5 throughout.  No deformity or atrophy.  Neurologic: CN 2-12 intact. Symmetrical.  Speech is fluent. Motor exam as listed above. Psychiatric: Judgment intact, Mood & affect appropriate for pt's clinical situation. Dermatologic: Venous stasis dermatitis with ulcers present on the left.  No changes consistent with cellulitis. Lymph : No lichenification or skin changes of chronic lymphedema.  CBC No results found for: WBC, HGB, HCT, MCV, PLT  BMET No results found for: NA, K, CL, CO2, GLUCOSE, BUN, CREATININE, CALCIUM, GFRNONAA, GFRAA CrCl cannot be calculated (No successful lab value found.).  COAG No results found for: INR, PROTIME  Radiology No results found.   Assessment/Plan 1. Venous ulcer of ankle, left (HCC) Previous venous ulcer is very nearly healed.  The patient also had a blister on the dorsal surface of his foot.  This is also very nearly healed.  We will keep the patient in Unna wraps for approximately 4 more weeks.  Based on the size of the wounds it is likely they will be healed at the end of this time.  The patient will also have time to obtain new medical grade 1 compression stockings during this time.  Patient and wife are in  agreement with this plan.  2. Venous insufficiency No surgery or intervention at this point in time.    I have had a long discussion with the patient regarding venous insufficiency and why it  causes symptoms, specifically venous ulceration . I have discussed with the patient the chronic skin changes that accompany venous insufficiency and the long term sequela such as infection and recurring  ulceration.  Patient will be placed in [employer] which will be changed weekly drainage permitting.  In addition, behavioral modification including several periods of elevation of the lower extremities during the day will be continued. Achieving a position with the ankles at heart level was stressed to the patient  The patient is instructed to begin routine exercise, especially walking on a daily basis   The patient can be assessed for graduated compression stockings or wraps as well as a Lymph Pump  once the ulcers are healed.  3. Essential hypertension Continue antihypertensive medications as already ordered, these medications have been reviewed and there are no changes at this time.     Levora Dredge, MD  05/08/2018 10:53 AM

## 2018-05-11 ENCOUNTER — Encounter (INDEPENDENT_AMBULATORY_CARE_PROVIDER_SITE_OTHER): Payer: Self-pay | Admitting: Vascular Surgery

## 2018-05-15 ENCOUNTER — Encounter (INDEPENDENT_AMBULATORY_CARE_PROVIDER_SITE_OTHER): Payer: Self-pay | Admitting: Nurse Practitioner

## 2018-05-15 ENCOUNTER — Ambulatory Visit (INDEPENDENT_AMBULATORY_CARE_PROVIDER_SITE_OTHER): Payer: Medicare Other | Admitting: Nurse Practitioner

## 2018-05-15 VITALS — BP 149/76 | HR 76 | Resp 14 | Ht 68.0 in | Wt 226.0 lb

## 2018-05-15 DIAGNOSIS — I83023 Varicose veins of left lower extremity with ulcer of ankle: Secondary | ICD-10-CM | POA: Diagnosis not present

## 2018-05-15 DIAGNOSIS — L97329 Non-pressure chronic ulcer of left ankle with unspecified severity: Secondary | ICD-10-CM

## 2018-05-15 DIAGNOSIS — I890 Lymphedema, not elsewhere classified: Secondary | ICD-10-CM | POA: Diagnosis not present

## 2018-05-15 DIAGNOSIS — I10 Essential (primary) hypertension: Secondary | ICD-10-CM

## 2018-05-20 ENCOUNTER — Encounter (INDEPENDENT_AMBULATORY_CARE_PROVIDER_SITE_OTHER): Payer: Self-pay | Admitting: Nurse Practitioner

## 2018-05-20 DIAGNOSIS — I890 Lymphedema, not elsewhere classified: Secondary | ICD-10-CM | POA: Insufficient documentation

## 2018-05-20 NOTE — Progress Notes (Signed)
Subjective:    Patient ID: Brandon Hooper, male    DOB: Nov 27, 1945, 73 y.o.   MRN: [id number] Chief Complaint  Patient presents with  . Follow-up    1 WEEK UNNA BOOT     HPI  Brandon Hooper is a 73 y.o. male that presents today for evaluation after several weeks of Unna wrap therapy.  The patient had an ulceration on the left ankle as well as a blister that subsequently popped on the dorsal surface of the left foot.  Currently all wounds are resolved.  Patient denies any pain in either area.  He denies any drainage denies that area.  He denies any fever, chills, nausea, vomiting or diarrhea.   Past Medical History:  Diagnosis Date  . Arthritis   . Bone loss     Past Surgical History:  Procedure Laterality Date  . EYE SURGERY Bilateral    Feb 2006 and then in Jan 2009    Social History   Socioeconomic History  . Marital status: Married    Spouse name: Not on file  . Number of children: Not on file  . Years of education: Not on file  . Highest education level: Not on file  Occupational History  . Not on file  Social Needs  . Financial resource strain: Not on file  . Food insecurity:    Worry: Not on file    Inability: Not on file  . Transportation needs:    Medical: Not on file    Non-medical: Not on file  Tobacco Use  . Smoking status: Never Smoker  . Smokeless tobacco: Never Used  Substance and Sexual Activity  . Alcohol use: Not Currently  . Drug use: Not on file  . Sexual activity: Not on file  Lifestyle  . Physical activity:    Days per week: Not on file    Minutes per session: Not on file  . Stress: Not on file  Relationships  . Social connections:    Talks on phone: Not on file    Gets together: Not on file    Attends religious service: Not on file    Active member of club or organization: Not on file    Attends meetings of clubs or organizations: Not on file    Relationship status: Not on file  . Intimate partner violence:    Fear of current  or ex partner: Not on file    Emotionally abused: Not on file    Physically abused: Not on file    Forced sexual activity: Not on file  Other Topics Concern  . Not on file  Social History Narrative  . Not on file    Family History  Problem Relation Age of Onset  . Diabetes Father     Allergies  Allergen Reactions  . Erythromycin Hives  . Ofloxacin     Other reaction(s): Other (See Comments) "Arthritic pain". The patient reported 5 years of left hip pain when he last took ofloxacin. Likely could try another fluoroquinolone.   . Cephalexin Rash    The patient previously tolerated cephalexin in 2013 and reported in 2019 that he developed redness and itching of his knees while on cephalexin. Not entirely clear it was a true allergic reaction.   . Clindamycin Hcl Rash  . Doxycycline Calcium Rash  . Sulfamethoxazole-Trimethoprim Rash  . Tetracycline Rash     Review of Systems   Review of Systems: Negative Unless Checked Constitutional: [] Weight loss  [] Fever  [] Chills Cardiac: []   Chest pain   []  Atrial Fibrillation  [] Palpitations   [] Shortness of breath when laying flat   [] Shortness of breath with exertion. [] Shortness of breath at rest Vascular:  [] Pain in legs with walking   [] Pain in legs with standing [] Pain in legs when laying flat   [] Claudication    [] Pain in feet when laying flat    [] History of DVT   [] Phlebitis   [x] Swelling in legs   [x] Varicose veins   [] Non-healing ulcers Pulmonary:   [] Uses home oxygen   [] Productive cough   [] Hemoptysis   [] Wheeze  [] COPD   [] Asthma Neurologic:  [] Dizziness   [] Seizures  [] Blackouts [] History of stroke   [] History of TIA  [] Aphasia   [] Temporary Blindness   [] Weakness or numbness in arm   [] Weakness or numbness in leg Musculoskeletal:   [] Joint swelling   [] Joint pain   [] Low back pain  []  History of Knee Replacement [] Arthritis [] back Surgeries  []  Spinal Stenosis    Hematologic:  [] Easy bruising  [] Easy bleeding   [] Hypercoagulable  state   [] Anemic Gastrointestinal:  [] Diarrhea   [] Vomiting  [] Gastroesophageal reflux/heartburn   [] Difficulty swallowing. [] Abdominal pain Genitourinary:  [] Chronic kidney disease   [] Difficult urination  [] Anuric   [] Blood in urine [] Frequent urination  [] Burning with urination   [] Hematuria Skin:  [] Rashes   [] Ulcers [] Wounds Psychological:  [] History of anxiety   []  History of major depression  []  Memory Difficulties     Objective:   Physical Exam  BP (!) 149/76 (BP Location: Right Arm, Patient Position: Sitting)   Pulse 76   Resp 14   Ht 5\' 8"  (1.727 m)   Wt 226 lb (102.5 kg)   BMI 34.36 kg/m   Gen: WD/WN, NAD Head: [place]/AT, No temporalis wasting.  Ear/Nose/Throat: Hearing grossly intact, nares w/o erythema or drainage Eyes: PER, EOMI, sclera nonicteric.  Neck: Supple, no masses.  No JVD.  Pulmonary:  Good air movement, no use of accessory muscles.  Cardiac: RRR Vascular:  1+ soft edema bilaterally Vessel Right Left  Radial Palpable Palpable  Dorsalis Pedis Palpable Palpable  Posterior Tibial Palpable Palpable   Gastrointestinal: soft, non-distended. No guarding/no peritoneal signs.  Musculoskeletal: M/S 5/5 throughout.  No deformity or atrophy.  Neurologic: Pain and light touch intact in extremities.  Symmetrical.  Speech is fluent. Motor exam as listed above. Psychiatric: Judgment intact, Mood & affect appropriate for pt's clinical situation. Dermatologic: No Venous rashes. No Ulcers Noted.  No changes consistent with cellulitis. Lymph : No Cervical lymphadenopathy, no lichenification or skin changes of chronic lymphedema.      Assessment & Plan:   1. Venous ulcer of ankle, left (HCC) Venous ulcer has currently resolved.  Per patient request, we will have him return in approximately 2 weeks to ensure that no ulcer has returned.  2. Essential hypertension Continue antihypertensive medications as already ordered, these medications have been reviewed and there are no  changes at this time.   3. Lymphedema I had an extensive discussion with the patient and his wife about the lymphedema pump and how it may allow for better control of his lower extremity edema, thus decreasing the risk of ulceration.  At this time the patient is hesitant to proceed with getting a lymphedema pump and wishes to proceed with using medical grade 1 compression socks, elevation, exercise and NSAIDs.   No current outpatient medications on file prior to visit.   No current facility-administered medications on file prior to visit.  There are no Patient Instructions on file for this visit. No follow-ups on file.   Kris Hartmann, NP  This note was completed with Sales executive.  Any errors are purely unintentional.

## 2018-05-28 ENCOUNTER — Ambulatory Visit (INDEPENDENT_AMBULATORY_CARE_PROVIDER_SITE_OTHER): Payer: Medicare Other | Admitting: Nurse Practitioner

## 2018-05-29 ENCOUNTER — Ambulatory Visit (INDEPENDENT_AMBULATORY_CARE_PROVIDER_SITE_OTHER): Payer: Medicare Other | Admitting: Vascular Surgery

## 2018-05-29 ENCOUNTER — Other Ambulatory Visit: Payer: Self-pay

## 2018-05-29 ENCOUNTER — Encounter (INDEPENDENT_AMBULATORY_CARE_PROVIDER_SITE_OTHER): Payer: Self-pay | Admitting: Vascular Surgery

## 2018-05-29 VITALS — BP 170/72 | HR 80 | Resp 12 | Ht 68.0 in | Wt 226.0 lb

## 2018-05-29 DIAGNOSIS — L97329 Non-pressure chronic ulcer of left ankle with unspecified severity: Secondary | ICD-10-CM

## 2018-05-29 DIAGNOSIS — I10 Essential (primary) hypertension: Secondary | ICD-10-CM

## 2018-05-29 DIAGNOSIS — I83023 Varicose veins of left lower extremity with ulcer of ankle: Secondary | ICD-10-CM

## 2018-05-29 DIAGNOSIS — I872 Venous insufficiency (chronic) (peripheral): Secondary | ICD-10-CM

## 2018-05-29 DIAGNOSIS — I890 Lymphedema, not elsewhere classified: Secondary | ICD-10-CM

## 2018-05-31 ENCOUNTER — Encounter (INDEPENDENT_AMBULATORY_CARE_PROVIDER_SITE_OTHER): Payer: Self-pay | Admitting: Vascular Surgery

## 2018-05-31 NOTE — Progress Notes (Signed)
MRN : [id number]  Brandon Hooper is a 73 y.o. ([birth year]) male who presents with chief complaint of  Chief Complaint  Patient presents with  . Follow-up  .  History of Present Illness: Patient is seen for follow up evaluation of leg pain and swelling associated with venous ulceration. The patient was recently seen here and started on Unna boot therapy.  The swelling abruptly became much worse bilaterally and is associated with pain and discoloration. The pain and swelling worsens with prolonged dependency and improves with elevation.  The patient notes that in the morning the legs are better but the leg symptoms worsened throughout the course of the day. The patient has also noted a progressive worsening of the discoloration in the ankle and shin area.   The patient notes that the ulcer is now healed.  The patient states that they have been elevating as much as possible. The patient denies any recent changes in medications.  The patient denies a history of DVT or PE. There is no prior history of phlebitis. There is no history of primary lymphedema.  No SOB or increased cough.  No sputum production.  No recent episodes of CHF exacerbation.   No outpatient medications have been marked as taking for the 05/29/18 encounter (Office Visit) with Gilda Crease, Latina Craver, MD.    Past Medical History:  Diagnosis Date  . Arthritis   . Bone loss     Past Surgical History:  Procedure Laterality Date  . EYE SURGERY Bilateral    Feb 2006 and then in Jan 2009    Social History Social History   Tobacco Use  . Smoking status: Never Smoker  . Smokeless tobacco: Never Used  Substance Use Topics  . Alcohol use: Not Currently  . Drug use: Not on file    Family History Family History  Problem Relation Age of Onset  . Diabetes Father     Allergies  Allergen Reactions  . Erythromycin Hives  . Ofloxacin     Other reaction(s): Other (See Comments) "Arthritic pain". The patient reported  5 years of left hip pain when he last took ofloxacin. Likely could try another fluoroquinolone.   . Cephalexin Rash    The patient previously tolerated cephalexin in 2013 and reported in 2019 that he developed redness and itching of his knees while on cephalexin. Not entirely clear it was a true allergic reaction.   . Clindamycin Hcl Rash  . Doxycycline Calcium Rash  . Sulfamethoxazole-Trimethoprim Rash  . Tetracycline Rash     REVIEW OF SYSTEMS (Negative unless checked)  Constitutional: [] Weight loss  [] Fever  [] Chills Cardiac: [] Chest pain   [] Chest pressure   [] Palpitations   [] Shortness of breath when laying flat   [x] Shortness of breath with exertion. Vascular:  [] Pain in legs with walking   [x] Pain in legs with standing  [] History of DVT   [] Phlebitis   [x] Swelling in legs   [] Varicose veins   [] Non-healing ulcers Pulmonary:   [] Uses home oxygen   [] Productive cough   [] Hemoptysis   [] Wheeze  [] COPD   [] Asthma Neurologic:  [] Dizziness   [] Seizures   [] History of stroke   [] History of TIA  [] Aphasia   [] Vissual changes   [] Weakness or numbness in arm   [] Weakness or numbness in leg Musculoskeletal:   [] Joint swelling   [x] Joint pain   [] Low back pain Hematologic:  [] Easy bruising  [] Easy bleeding   [] Hypercoagulable state   [] Anemic Gastrointestinal:  [] Diarrhea   [] Vomiting  []   Gastroesophageal reflux/heartburn   [] Difficulty swallowing. Genitourinary:  [] Chronic kidney disease   [] Difficult urination  [] Frequent urination   [] Blood in urine Skin:  [] Rashes   [] Ulcers  Psychological:  [] History of anxiety   []  History of major depression.  Physical Examination  Vitals:   05/29/18 1125  BP: (!) 170/72  Pulse: 80  Resp: 12  Weight: 226 lb (102.5 kg)  Height: 5\' 8"  (1.727 m)   Body mass index is 34.36 kg/m. Gen: WD/WN, NAD Head: [place]/AT, No temporalis wasting.  Ear/Nose/Throat: Hearing grossly intact, nares w/o erythema or drainage Eyes: PER, EOMI, sclera nonicteric.  Neck:  Supple, no large masses.   Pulmonary:  Good air movement, no audible wheezing bilaterally, no use of accessory muscles.  Cardiac: RRR, no JVD Vascular:scattered varicosities present bilaterally.  Mild venous stasis changes to the legs bilaterally.  2+ soft pitting edema, ulcers now healed Vessel Right Left  Radial Palpable Palpable  Gastrointestinal: Non-distended. No guarding/no peritoneal signs.  Musculoskeletal: M/S 5/5 throughout.  No deformity or atrophy.  Neurologic: CN 2-12 intact. Symmetrical.  Speech is fluent. Motor exam as listed above. Psychiatric: Judgment intact, Mood & affect appropriate for pt's clinical situation. Dermatologic: venous rashes ulcers now healed.  No changes consistent with cellulitis. Lymph : No lichenification or skin changes of chronic lymphedema.  CBC No results found for: WBC, HGB, HCT, MCV, PLT  BMET No results found for: NA, K, CL, CO2, GLUCOSE, BUN, CREATININE, CALCIUM, GFRNONAA, GFRAA CrCl cannot be calculated (No successful lab value found.).  COAG No results found for: INR, PROTIME  Radiology No results found.   Assessment/Plan 1. Venous ulcer of ankle, left (HCC) Now healed  2. Venous insufficiency No surgery or intervention at this point in time.    I have had a long discussion with the patient regarding venous insufficiency and why it  causes symptoms. I have discussed with the patient the chronic skin changes that accompany venous insufficiency and the long term sequela such as infection and ulceration.  Patient will begin wearing graduated compression stockings class 71 (20-30 mmHg) or compression wraps on a daily basis a prescription was given. The patient will put the stockings on first thing in the morning and removing them in the evening. The patient is instructed specifically not to sleep in the stockings.    In addition, behavioral modification including several periods of elevation of the lower extremities during the day will be  continued. I have demonstrated that proper elevation is a position with the ankles at heart level.  The patient is instructed to begin routine exercise, especially walking on a daily basis  3. Lymphedema No surgery or intervention at this point in time.    I have had a long discussion with the patient regarding venous insufficiency and why it  causes symptoms. I have discussed with the patient the chronic skin changes that accompany venous insufficiency and the long term sequela such as infection and ulceration.  Patient will begin wearing graduated compression stockings class 71 (20-30 mmHg) or compression wraps on a daily basis a prescription was given. The patient will put the stockings on first thing in the morning and removing them in the evening. The patient is instructed specifically not to sleep in the stockings.    In addition, behavioral modification including several periods of elevation of the lower extremities during the day will be continued. I have demonstrated that proper elevation is a position with the ankles at heart level.  The patient is instructed  to begin routine exercise, especially walking on a daily basis  4. Essential hypertension Continue antihypertensive medications as already ordered, these medications have been reviewed and there are no changes at this time.    Levora Dredge, MD  05/31/2018 10:31 AM

## 2018-06-12 ENCOUNTER — Encounter (INDEPENDENT_AMBULATORY_CARE_PROVIDER_SITE_OTHER): Payer: Self-pay | Admitting: Nurse Practitioner

## 2018-06-12 ENCOUNTER — Other Ambulatory Visit: Payer: Self-pay

## 2018-06-12 ENCOUNTER — Ambulatory Visit (INDEPENDENT_AMBULATORY_CARE_PROVIDER_SITE_OTHER): Payer: Medicare Other | Admitting: Nurse Practitioner

## 2018-06-12 VITALS — BP 159/74 | HR 75 | Resp 10 | Ht 68.0 in | Wt 226.0 lb

## 2018-06-12 DIAGNOSIS — I890 Lymphedema, not elsewhere classified: Secondary | ICD-10-CM | POA: Diagnosis not present

## 2018-06-12 DIAGNOSIS — L97329 Non-pressure chronic ulcer of left ankle with unspecified severity: Secondary | ICD-10-CM

## 2018-06-12 DIAGNOSIS — I10 Essential (primary) hypertension: Secondary | ICD-10-CM

## 2018-06-12 DIAGNOSIS — I83023 Varicose veins of left lower extremity with ulcer of ankle: Secondary | ICD-10-CM | POA: Diagnosis not present

## 2018-06-12 DIAGNOSIS — Z79899 Other long term (current) drug therapy: Secondary | ICD-10-CM

## 2018-06-12 NOTE — Progress Notes (Signed)
SUBJECTIVE:  Patient ID: Brandon Hooper, male    DOB: Nov 12, 1945, 73 y.o.   MRN: [id number] Chief Complaint  Patient presents with  . Follow-up    HPI  Brandon Hooper is a 73 y.o. male that presents today for follow-up evaluation of lower extremity ulcerations after removal of Unna wraps 2 weeks ago.  Today his legs look well with all previous ulcerations healed with a few with very small scabs.  The blister that was on the patient's posterior foot, has no evidence that it was there.  Overall, the legs look fairly good.  Swelling is minimal.  The patient endorses continuing to wear medical grade 1 compression stockings on a daily basis.  Elevates his lower extremities much as possible with exercise daily.  He denies any fever, chills, nausea, vomiting or shortness of breath.  He denies any TIA-like symptoms or chest pain.  Overall he states that he feels that his legs are feeling better and looking much better.  Past Medical History:  Diagnosis Date  . Arthritis   . Bone loss     Past Surgical History:  Procedure Laterality Date  . EYE SURGERY Bilateral    Feb 2006 and then in Jan 2009    Social History   Socioeconomic History  . Marital status: Married    Spouse name: Not on file  . Number of children: Not on file  . Years of education: Not on file  . Highest education level: Not on file  Occupational History  . Not on file  Social Needs  . Financial resource strain: Not on file  . Food insecurity:    Worry: Not on file    Inability: Not on file  . Transportation needs:    Medical: Not on file    Non-medical: Not on file  Tobacco Use  . Smoking status: Never Smoker  . Smokeless tobacco: Never Used  Substance and Sexual Activity  . Alcohol use: Not Currently  . Drug use: Not on file  . Sexual activity: Not on file  Lifestyle  . Physical activity:    Days per week: Not on file    Minutes per session: Not on file  . Stress: Not on file  Relationships  .  Social connections:    Talks on phone: Not on file    Gets together: Not on file    Attends religious service: Not on file    Active member of club or organization: Not on file    Attends meetings of clubs or organizations: Not on file    Relationship status: Not on file  . Intimate partner violence:    Fear of current or ex partner: Not on file    Emotionally abused: Not on file    Physically abused: Not on file    Forced sexual activity: Not on file  Other Topics Concern  . Not on file  Social History Narrative  . Not on file    Family History  Problem Relation Age of Onset  . Diabetes Father     Allergies  Allergen Reactions  . Erythromycin Hives  . Ofloxacin     Other reaction(s): Other (See Comments) "Arthritic pain". The patient reported 5 years of left hip pain when he last took ofloxacin. Likely could try another fluoroquinolone.   . Cephalexin Rash    The patient previously tolerated cephalexin in 2013 and reported in 2019 that he developed redness and itching of his knees while on cephalexin. Not entirely clear  it was a true allergic reaction.   . Clindamycin Hcl Rash  . Doxycycline Calcium Rash  . Sulfamethoxazole-Trimethoprim Rash  . Tetracycline Rash     Review of Systems   Review of Systems: Negative Unless Checked Constitutional: [] Weight loss  [] Fever  [] Chills Cardiac: [] Chest pain   []  Atrial Fibrillation  [] Palpitations   [] Shortness of breath when laying flat   [] Shortness of breath with exertion. [] Shortness of breath at rest Vascular:  [] Pain in legs with walking   [] Pain in legs with standing [] Pain in legs when laying flat   [] Claudication    [] Pain in feet when laying flat    [] History of DVT   [] Phlebitis   [x] Swelling in legs   [] Varicose veins   [] Non-healing ulcers Pulmonary:   [] Uses home oxygen   [] Productive cough   [] Hemoptysis   [] Wheeze  [] COPD   [] Asthma Neurologic:  [] Dizziness   [] Seizures  [] Blackouts [] History of stroke   [] History  of TIA  [] Aphasia   [] Temporary Blindness   [] Weakness or numbness in arm   [] Weakness or numbness in leg Musculoskeletal:   [] Joint swelling   [] Joint pain   [] Low back pain  []  History of Knee Replacement [] Arthritis [] back Surgeries  []  Spinal Stenosis    Hematologic:  [] Easy bruising  [] Easy bleeding   [] Hypercoagulable state   [] Anemic Gastrointestinal:  [] Diarrhea   [] Vomiting  [] Gastroesophageal reflux/heartburn   [] Difficulty swallowing. [] Abdominal pain Genitourinary:  [] Chronic kidney disease   [] Difficult urination  [] Anuric   [] Blood in urine [] Frequent urination  [] Burning with urination   [] Hematuria Skin:  [] Rashes   [] Ulcers [] Wounds Psychological:  [] History of anxiety   []  History of major depression  []  Memory Difficulties      OBJECTIVE:   Physical Exam  BP (!) 159/74 (BP Location: Left Arm, Patient Position: Sitting, Cuff Size: Large)   Pulse 75   Resp 10   Ht 5\' 8"  (1.727 m)   Wt 226 lb (102.5 kg)   BMI 34.36 kg/m   Gen: WD/WN, NAD Head: [place]/AT, No temporalis wasting.  Ear/Nose/Throat: Hearing grossly intact, nares w/o erythema or drainage Eyes: PER, EOMI, sclera nonicteric.  Neck: Supple, no masses.  No JVD.  Pulmonary:  Good air movement, no use of accessory muscles.  Cardiac: RRR Vascular:  Vessel Right Left  Radial Palpable Palpable  Dorsalis Pedis Palpable Palpable  Posterior Tibial Palpable Palpable   Gastrointestinal: soft, non-distended. No guarding/no peritoneal signs.  Musculoskeletal: M/S 5/5 throughout.  No deformity or atrophy.  Neurologic: Pain and light touch intact in extremities.  Symmetrical.  Speech is fluent. Motor exam as listed above. Psychiatric: Judgment intact, Mood & affect appropriate for pt's clinical situation. Dermatologic:  Stasis dermatitis no Ulcers Noted.  No changes consistent with cellulitis. Lymph : No Cervical lymphadenopathy, no lichenification or skin changes of chronic lymphedema.       ASSESSMENT AND PLAN:  1.  Venous ulcer of ankle, left (HCC) This is completely resolved at this time.  Discussed managing lymphedema with the patient.  Also stressed utilizing moisturizing substances to the lower extremity due to the fact that dry skin can cause cracks that lead ulcerations.  Patient understood as he was given a list of lotions at previous visit.  He states that he will obtain him to obtain some lotion for his lower extremities.  2. Essential hypertension Continue antihypertensive medications as already ordered, these medications have been reviewed and there are no changes at this time.   3.  Lymphedema Continue to stress the importance of wearing medical grade 1 compression socks on a daily basis.  Also the patient will continue to elevate his lower extremities as much as possible as well as to continue to exercise 30 minutes/day.  Should he develop any pain in his lower extremities he is instructed to utilize ibuprofen or Tylenol.  Patient will follow-up in 1 month to evaluate swelling status.   No current outpatient medications on file prior to visit.   No current facility-administered medications on file prior to visit.     There are no Patient Instructions on file for this visit. Return in about 1 month (around 07/13/2018).   Georgiana Spinner, NP  This note was completed with Office manager.  Any errors are purely unintentional.

## 2018-07-10 ENCOUNTER — Ambulatory Visit (INDEPENDENT_AMBULATORY_CARE_PROVIDER_SITE_OTHER): Payer: Medicare Other | Admitting: Nurse Practitioner

## 2018-08-21 ENCOUNTER — Encounter (INDEPENDENT_AMBULATORY_CARE_PROVIDER_SITE_OTHER): Payer: Self-pay

## 2018-08-21 ENCOUNTER — Ambulatory Visit (INDEPENDENT_AMBULATORY_CARE_PROVIDER_SITE_OTHER): Payer: Medicare Other | Admitting: Nurse Practitioner

## 2018-10-07 ENCOUNTER — Ambulatory Visit (INDEPENDENT_AMBULATORY_CARE_PROVIDER_SITE_OTHER): Payer: Medicare Other | Admitting: Vascular Surgery

## 2019-06-05 ENCOUNTER — Ambulatory Visit: Payer: Medicare Other | Attending: Internal Medicine

## 2019-06-05 DIAGNOSIS — Z23 Encounter for immunization: Secondary | ICD-10-CM | POA: Insufficient documentation

## 2019-06-05 NOTE — Progress Notes (Signed)
   Covid-19 Vaccination Clinic  Name:  Brandon Hooper    MRN: [id number] DOB: 06-19-1945  06/05/2019  [name] was observed post Covid-19 immunization for 15 minutes without incidence. He was provided with Vaccine Information Sheet and instruction to access the V-Safe system.   [name] was instructed to call 911 with any severe reactions post vaccine: Marland Kitchen Difficulty breathing  . Swelling of your face and throat  . A fast heartbeat  . A bad rash all over your body  . Dizziness and weakness    Immunizations Administered    Name Date Dose VIS Date Route   Pfizer COVID-19 Vaccine 06/05/2019  9:03 AM 0.3 mL 03/20/2019 Intramuscular   Manufacturer: [employer], [employer]   Lot: [id number]   NDC: [address]-6

## 2019-06-30 ENCOUNTER — Ambulatory Visit: Payer: Medicare Other | Attending: Internal Medicine

## 2019-06-30 DIAGNOSIS — Z23 Encounter for immunization: Secondary | ICD-10-CM

## 2019-06-30 NOTE — Progress Notes (Signed)
   Covid-19 Vaccination Clinic  Name:  Brandon Hooper    MRN: [id number] DOB: [birth year]  06/30/2019  Brandon Hooper was observed post Covid-19 immunization for 15 minutes without incident. He was provided with Vaccine Information Sheet and instruction to access the V-Safe system.   Brandon Hooper was instructed to call 911 with any severe reactions post vaccine: Marland Kitchen Difficulty breathing  . Swelling of face and throat  . A fast heartbeat  . A bad rash all over body  . Dizziness and weakness   Immunizations Administered    Name Date Dose VIS Date Route   Pfizer COVID-19 Vaccine 06/30/2019 10:43 AM 0.3 mL 03/20/2019 Intramuscular   Manufacturer: [employer], [employer]   Lot: [id number]   NDC: [address]-3

## 2020-01-25 ENCOUNTER — Ambulatory Visit: Payer: Self-pay | Attending: Internal Medicine

## 2020-01-25 DIAGNOSIS — Z23 Encounter for immunization: Secondary | ICD-10-CM

## 2020-01-25 NOTE — Progress Notes (Signed)
   Covid-19 Vaccination Clinic  Name:  Criston Chancellor    MRN: [id number] DOB: 1946-03-09  01/25/2020  Mr. Hardge was observed post Covid-19 immunization for 15 minutes without incident. He was provided with Vaccine Information Sheet and instruction to access the V-Safe system.   Mr. Haigler was instructed to call 911 with any severe reactions post vaccine: Marland Kitchen Difficulty breathing  . Swelling of face and throat  . A fast heartbeat  . A bad rash all over body  . Dizziness and weakness

## 2020-12-29 IMAGING — CR DG HIP (WITH OR WITHOUT PELVIS) 2-3V*L*
1 series · 3 of 3 positions shown · non-contrast
Comparison: None.

CLINICAL DATA: Status post fall.

EXAM:
DG HIP (WITH OR WITHOUT PELVIS) 2-3V LEFT

[Series 1: dg hip unilat w or w/o pelvis 2-3 views  · non-contrast · 0.14mm/px · 3 of 3 slices shown]
[im 1/3]
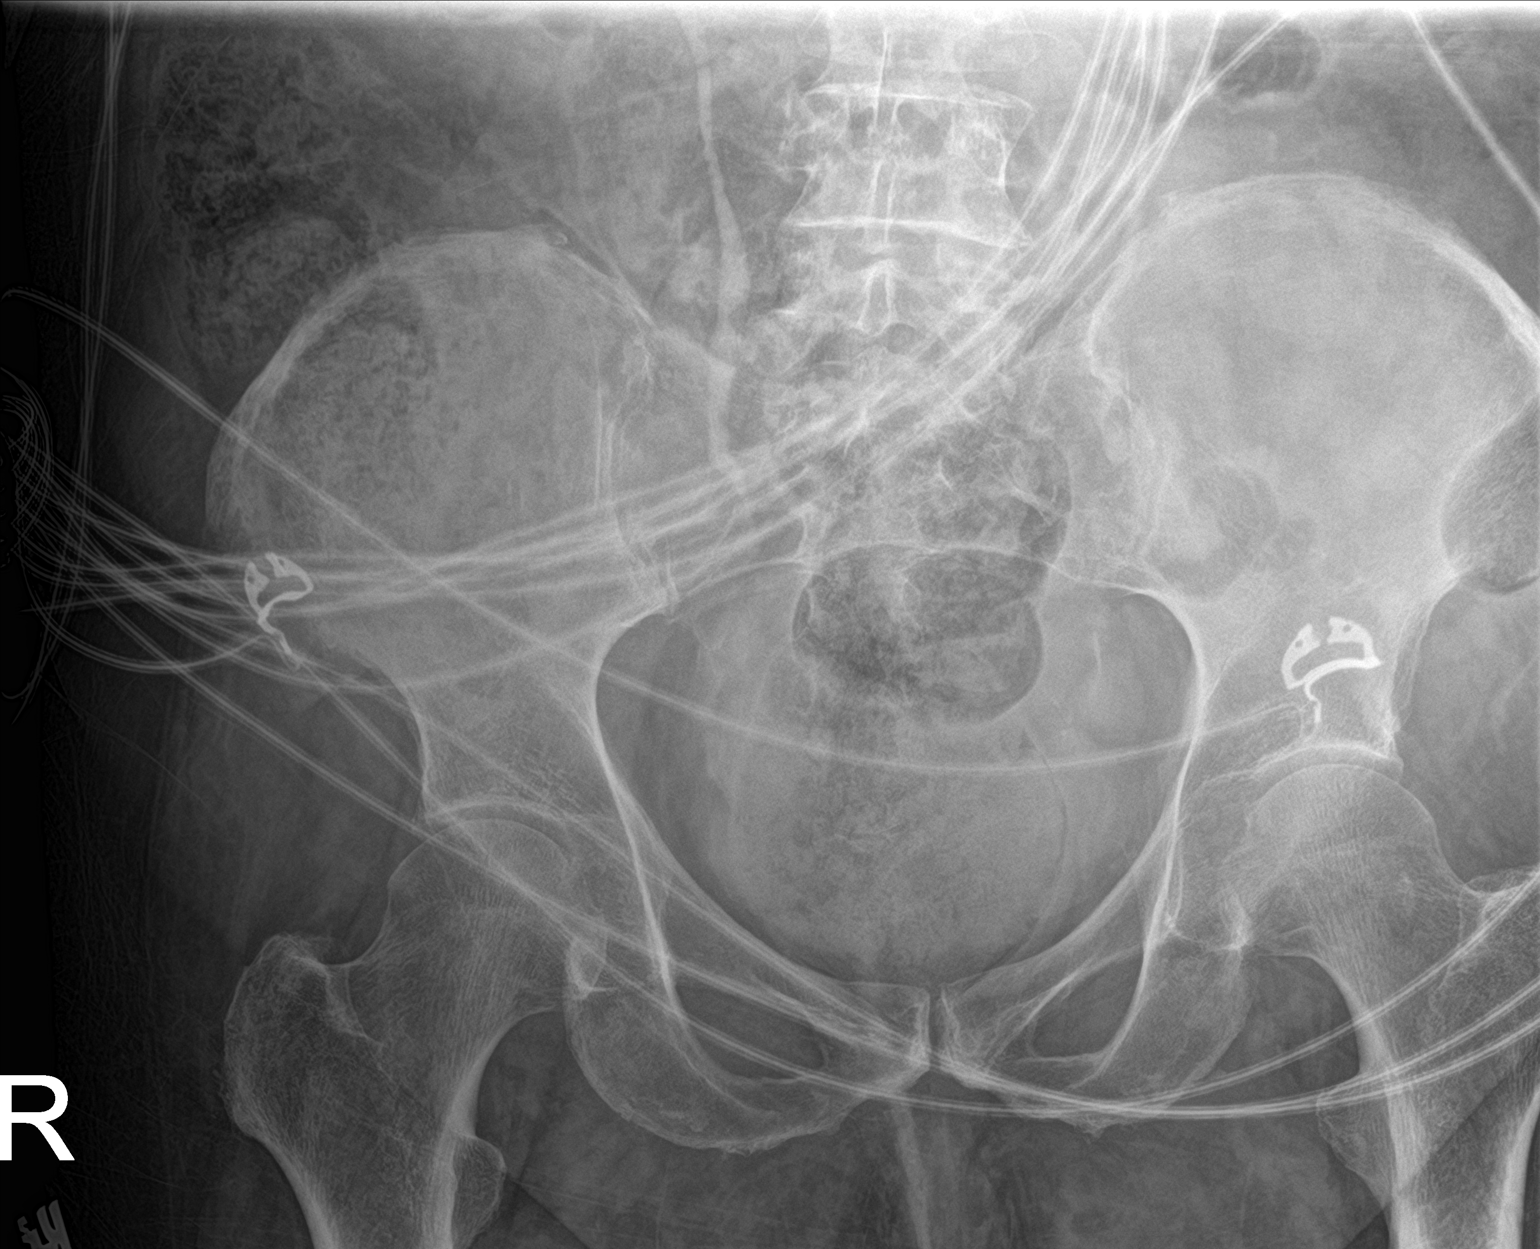
[im 2/3]
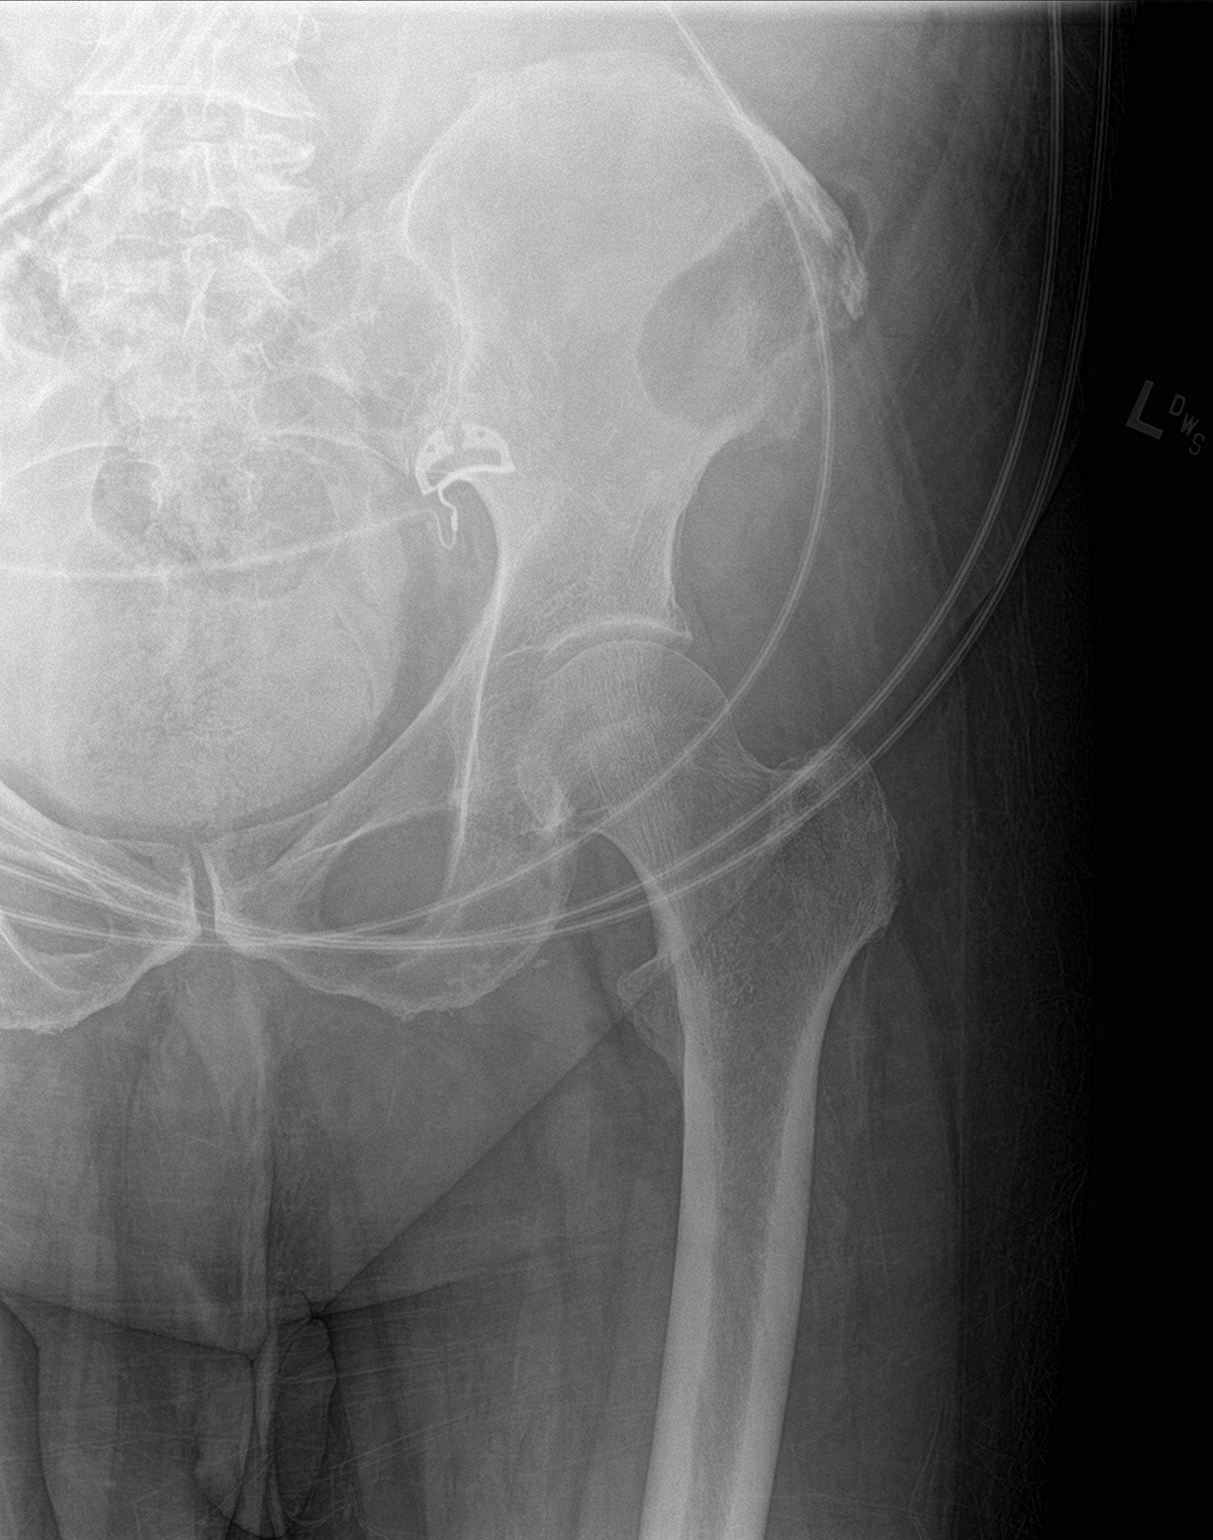
[im 3/3]
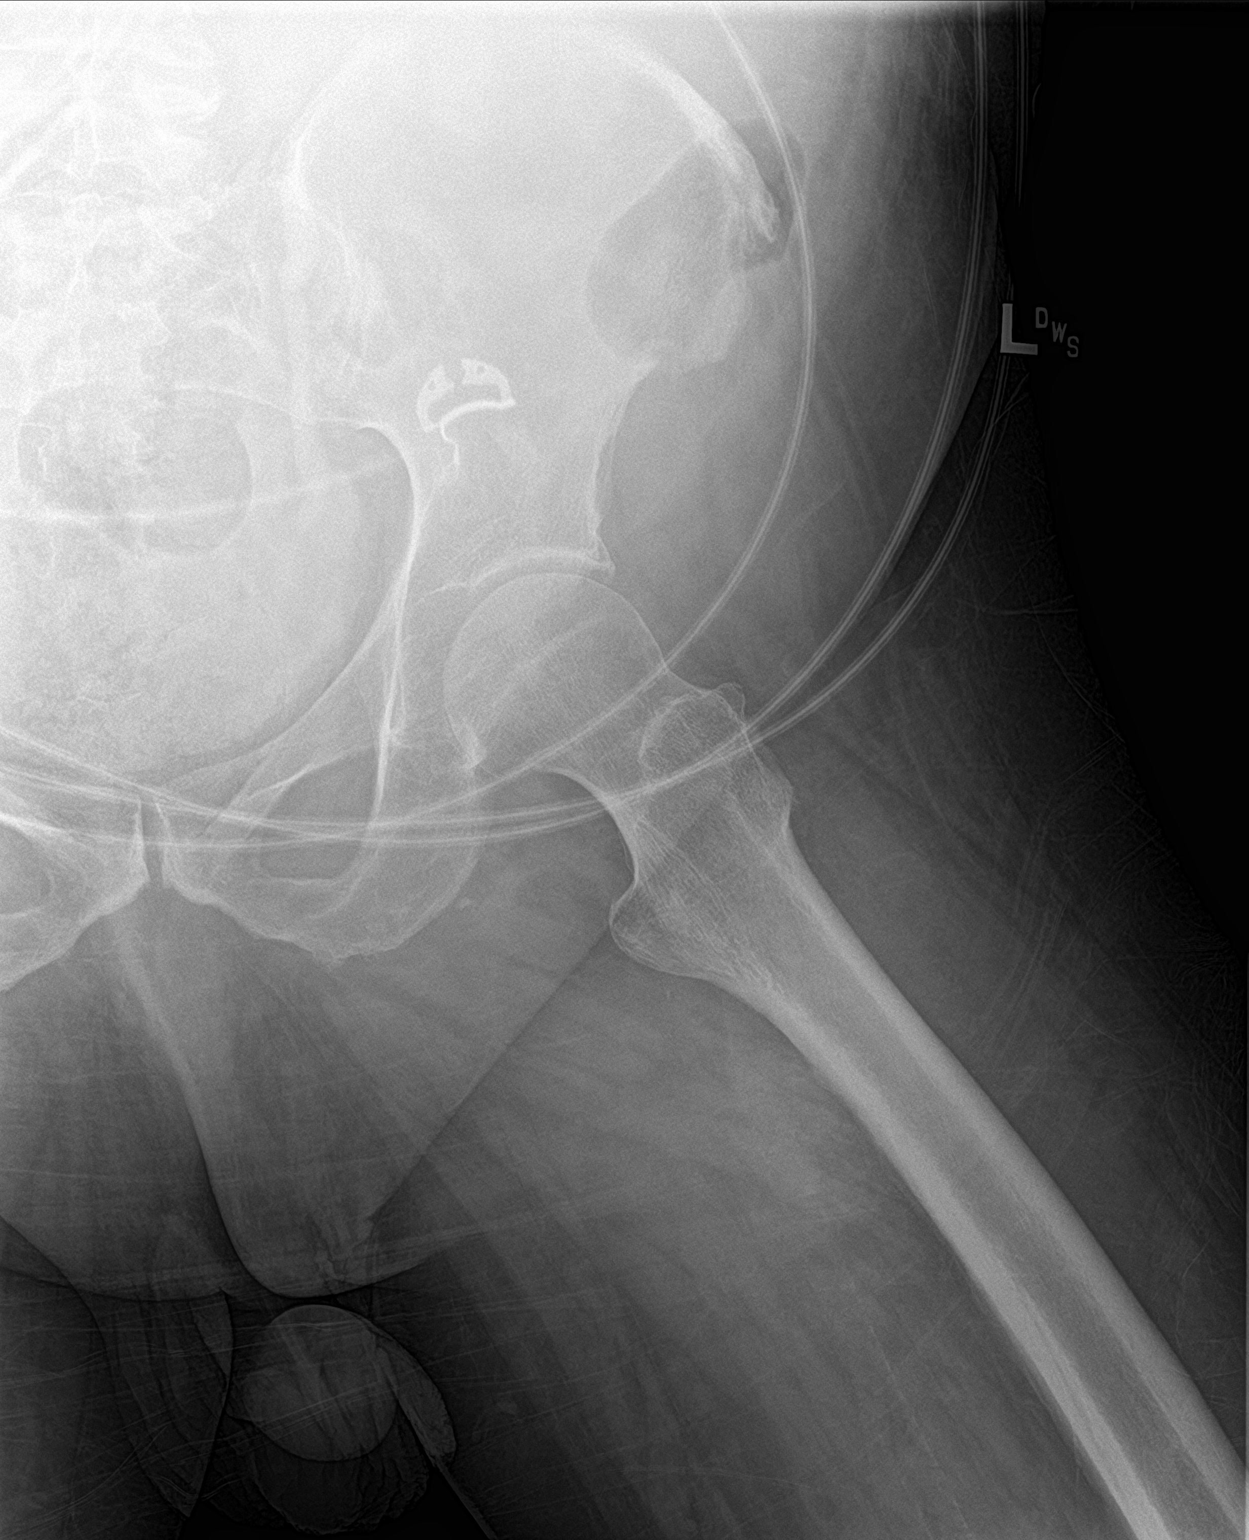

[3 of 3 positions shown; findings below may reference images not displayed]

FINDINGS: There is no evidence of hip fracture or dislocation. There is no
evidence of arthropathy or other focal bone abnormality.
IMPRESSION: Negative.

## 2021-02-12 ENCOUNTER — Emergency Department: Payer: Medicare Other

## 2021-02-12 ENCOUNTER — Other Ambulatory Visit: Payer: Self-pay

## 2021-02-12 ENCOUNTER — Observation Stay: Payer: Medicare Other

## 2021-02-12 ENCOUNTER — Observation Stay
Admit: 2021-02-12 | Discharge: 2021-02-12 | Disposition: A | Discharge status: Home / Self Care | Payer: Medicare Other | Attending: Internal Medicine | Admitting: Internal Medicine

## 2021-02-12 ENCOUNTER — Encounter: Payer: Self-pay | Admitting: Emergency Medicine

## 2021-02-12 ENCOUNTER — Inpatient Hospital Stay
Admission: EM | Admit: 2021-02-12 | Discharge: 2021-02-15 | DRG: 065 | Disposition: A | Discharge status: Skilled Nursing Facility | Payer: Medicare Other | Attending: Internal Medicine | Admitting: Internal Medicine

## 2021-02-12 DIAGNOSIS — D72829 Elevated white blood cell count, unspecified: Secondary | ICD-10-CM

## 2021-02-12 DIAGNOSIS — E876 Hypokalemia: Secondary | ICD-10-CM

## 2021-02-12 DIAGNOSIS — Z6832 Body mass index (BMI) 32.0-32.9, adult: Secondary | ICD-10-CM

## 2021-02-12 DIAGNOSIS — R4781 Slurred speech: Secondary | ICD-10-CM

## 2021-02-12 DIAGNOSIS — W06XXXA Fall from bed, initial encounter: Secondary | ICD-10-CM | POA: Diagnosis present

## 2021-02-12 DIAGNOSIS — I639 Cerebral infarction, unspecified: Secondary | ICD-10-CM

## 2021-02-12 DIAGNOSIS — Y92009 Unspecified place in unspecified non-institutional (private) residence as the place of occurrence of the external cause: Secondary | ICD-10-CM

## 2021-02-12 DIAGNOSIS — W19XXXA Unspecified fall, initial encounter: Principal | ICD-10-CM

## 2021-02-12 DIAGNOSIS — I63542 Cerebral infarction due to unspecified occlusion or stenosis of left cerebellar artery: Secondary | ICD-10-CM | POA: Diagnosis not present

## 2021-02-12 DIAGNOSIS — K429 Umbilical hernia without obstruction or gangrene: Secondary | ICD-10-CM | POA: Diagnosis present

## 2021-02-12 DIAGNOSIS — Z881 Allergy status to other antibiotic agents status: Secondary | ICD-10-CM

## 2021-02-12 DIAGNOSIS — R52 Pain, unspecified: Secondary | ICD-10-CM

## 2021-02-12 DIAGNOSIS — Z20822 Contact with and (suspected) exposure to covid-19: Secondary | ICD-10-CM | POA: Diagnosis present

## 2021-02-12 DIAGNOSIS — E669 Obesity, unspecified: Secondary | ICD-10-CM | POA: Diagnosis present

## 2021-02-12 DIAGNOSIS — I358 Other nonrheumatic aortic valve disorders: Secondary | ICD-10-CM | POA: Diagnosis present

## 2021-02-12 DIAGNOSIS — G459 Transient cerebral ischemic attack, unspecified: Secondary | ICD-10-CM

## 2021-02-12 DIAGNOSIS — S42212A Unspecified displaced fracture of surgical neck of left humerus, initial encounter for closed fracture: Secondary | ICD-10-CM | POA: Diagnosis not present

## 2021-02-12 DIAGNOSIS — I10 Essential (primary) hypertension: Secondary | ICD-10-CM | POA: Diagnosis not present

## 2021-02-12 DIAGNOSIS — Z882 Allergy status to sulfonamides status: Secondary | ICD-10-CM

## 2021-02-12 DIAGNOSIS — I700 Atherosclerosis of aorta: Secondary | ICD-10-CM | POA: Diagnosis present

## 2021-02-12 DIAGNOSIS — I872 Venous insufficiency (chronic) (peripheral): Secondary | ICD-10-CM | POA: Diagnosis present

## 2021-02-12 DIAGNOSIS — Z7982 Long term (current) use of aspirin: Secondary | ICD-10-CM

## 2021-02-12 DIAGNOSIS — R29703 NIHSS score 3: Secondary | ICD-10-CM | POA: Diagnosis present

## 2021-02-12 DIAGNOSIS — R29810 Facial weakness: Secondary | ICD-10-CM | POA: Diagnosis present

## 2021-02-12 DIAGNOSIS — Z833 Family history of diabetes mellitus: Secondary | ICD-10-CM

## 2021-02-12 DIAGNOSIS — M1990 Unspecified osteoarthritis, unspecified site: Secondary | ICD-10-CM | POA: Diagnosis present

## 2021-02-12 HISTORY — DX: Essential (primary) hypertension: I10

## 2021-02-12 LAB — HEMOGLOBIN A1C
Hgb A1c MFr Bld: 5.6 % (ref 4.8–5.6)
Mean Plasma Glucose: 114.02 mg/dL

## 2021-02-12 LAB — URINALYSIS, COMPLETE (UACMP) WITH MICROSCOPIC
Bacteria, UA: NONE SEEN
Bilirubin Urine: NEGATIVE
Glucose, UA: NEGATIVE mg/dL
Ketones, ur: NEGATIVE mg/dL
Leukocytes,Ua: NEGATIVE
Nitrite: NEGATIVE
Protein, ur: NEGATIVE mg/dL
Specific Gravity, Urine: 1.042 — ABNORMAL HIGH (ref 1.005–1.030)
Squamous Epithelial / HPF: NONE SEEN (ref 0–5)
pH: 6 (ref 5.0–8.0)

## 2021-02-12 LAB — CBC
HCT: 41.1 % (ref 39.0–52.0)
Hemoglobin: 13.8 g/dL (ref 13.0–17.0)
MCH: 31.2 pg (ref 26.0–34.0)
MCHC: 33.6 g/dL (ref 30.0–36.0)
MCV: 93 fL (ref 80.0–100.0)
Platelets: 242 10*3/uL (ref 150–400)
RBC: 4.42 MIL/uL (ref 4.22–5.81)
RDW: 14.1 % (ref 11.5–15.5)
WBC: 18.1 10*3/uL — ABNORMAL HIGH (ref 4.0–10.5)
nRBC: 0 % (ref 0.0–0.2)

## 2021-02-12 LAB — ECHOCARDIOGRAM COMPLETE
AR max vel: 2.21 cm2
AV Peak grad: 8.8 mmHg
Ao pk vel: 1.48 m/s
Area-P 1/2: 3.77 cm2
S' Lateral: 2.87 cm

## 2021-02-12 LAB — MAGNESIUM: Magnesium: 2.1 mg/dL (ref 1.7–2.4)

## 2021-02-12 LAB — COMPREHENSIVE METABOLIC PANEL
ALT: 11 U/L (ref 0–44)
AST: 22 U/L (ref 15–41)
Albumin: 3.7 g/dL (ref 3.5–5.0)
Alkaline Phosphatase: 66 U/L (ref 38–126)
Anion gap: 9 (ref 5–15)
BUN: 15 mg/dL (ref 8–23)
CO2: 23 mmol/L (ref 22–32)
Calcium: 8.8 mg/dL — ABNORMAL LOW (ref 8.9–10.3)
Chloride: 102 mmol/L (ref 98–111)
Creatinine, Ser: 0.79 mg/dL (ref 0.61–1.24)
GFR, Estimated: >60 mL/min (ref >60)
Glucose, Bld: 185 mg/dL — ABNORMAL HIGH (ref 70–99)
Potassium: 3.4 mmol/L — ABNORMAL LOW (ref 3.5–5.1)
Sodium: 134 mmol/L — ABNORMAL LOW (ref 135–145)
Total Bilirubin: 1.3 mg/dL — ABNORMAL HIGH (ref 0.3–1.2)
Total Protein: 7.2 g/dL (ref 6.5–8.1)

## 2021-02-12 LAB — CBG MONITORING, ED: Glucose-Capillary: 172 mg/dL — ABNORMAL HIGH (ref 70–99)

## 2021-02-12 LAB — URINE DRUG SCREEN, QUALITATIVE (ARMC ONLY)
Amphetamines, Ur Screen: NOT DETECTED
Barbiturates, Ur Screen: NOT DETECTED
Benzodiazepine, Ur Scrn: NOT DETECTED
Cannabinoid 50 Ng, Ur ~~LOC~~: NOT DETECTED
Cocaine Metabolite,Ur ~~LOC~~: NOT DETECTED
MDMA (Ecstasy)Ur Screen: NOT DETECTED
Methadone Scn, Ur: NOT DETECTED
Opiate, Ur Screen: NOT DETECTED
Phencyclidine (PCP) Ur S: NOT DETECTED
Tricyclic, Ur Screen: NOT DETECTED

## 2021-02-12 LAB — DIFFERENTIAL
Abs Immature Granulocytes: 0.12 10*3/uL — ABNORMAL HIGH (ref 0.00–0.07)
Basophils Absolute: 0.1 10*3/uL (ref 0.0–0.1)
Basophils Relative: 0 %
Eosinophils Absolute: 0 10*3/uL (ref 0.0–0.5)
Eosinophils Relative: 0 %
Immature Granulocytes: 1 %
Lymphocytes Relative: 9 %
Lymphs Abs: 1.6 10*3/uL (ref 0.7–4.0)
Monocytes Absolute: 1.4 10*3/uL — ABNORMAL HIGH (ref 0.1–1.0)
Monocytes Relative: 8 %
Neutro Abs: 14.9 10*3/uL — ABNORMAL HIGH (ref 1.7–7.7)
Neutrophils Relative %: 82 %

## 2021-02-12 LAB — RESP PANEL BY RT-PCR (FLU A&B, COVID) ARPGX2
Influenza A by PCR: NEGATIVE
Influenza B by PCR: NEGATIVE
SARS Coronavirus 2 by RT PCR: NEGATIVE

## 2021-02-12 LAB — PROTIME-INR
INR: 1 (ref 0.8–1.2)
Prothrombin Time: 12.8 seconds (ref 11.4–15.2)

## 2021-02-12 LAB — PROCALCITONIN: Procalcitonin: <0.1 ng/mL

## 2021-02-12 LAB — TROPONIN I (HIGH SENSITIVITY): Troponin I (High Sensitivity): 7 ng/L (ref <18)

## 2021-02-12 LAB — APTT: aPTT: 26 seconds (ref 24–36)

## 2021-02-12 LAB — ETHANOL: Alcohol, Ethyl (B): <10 mg/dL (ref <10)

## 2021-02-12 IMAGING — CT CT HEAD CODE STROKE
3 series · 14 of 47 positions shown, 16 images · non-contrast
Comparison: None.
COMPARISON: None.

Addendum:
CLINICAL DATA: Code stroke. 75-year-old male with left side
weakness last known well [5G] hours.

EXAM:
CT HEAD WITHOUT CONTRAST
TECHNIQUE: Contiguous axial images were obtained from the base of the skull
through the vertex without intravenous contrast.

[Series 3: head wo · axial · 0.45mm/px · z∈[-267,-112]mm · 8 of 37 slices shown, 10 images]
[im 3/37  brain]
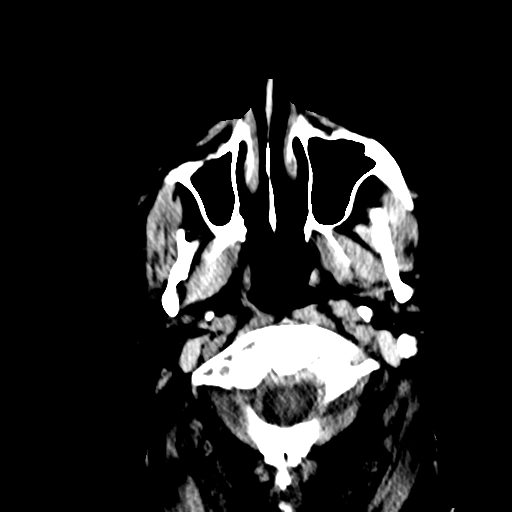
[im 3/37  bone]
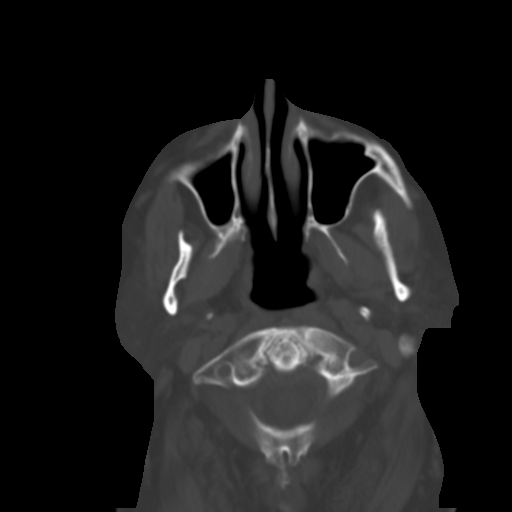
[im 8/37  brain]
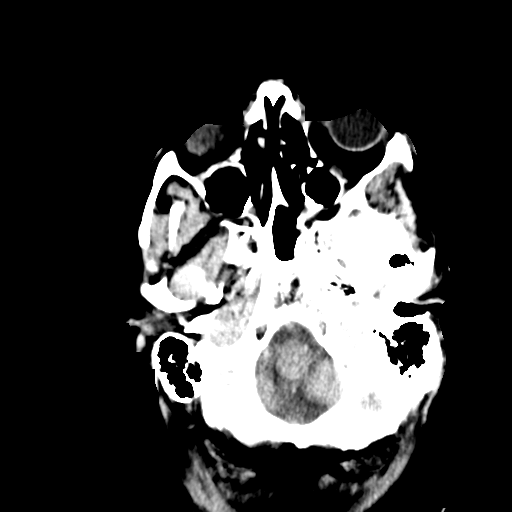
[im 12/37  brain]
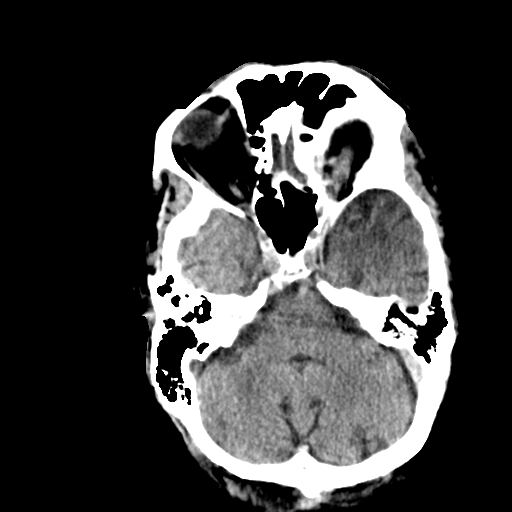
[im 17/37  brain]
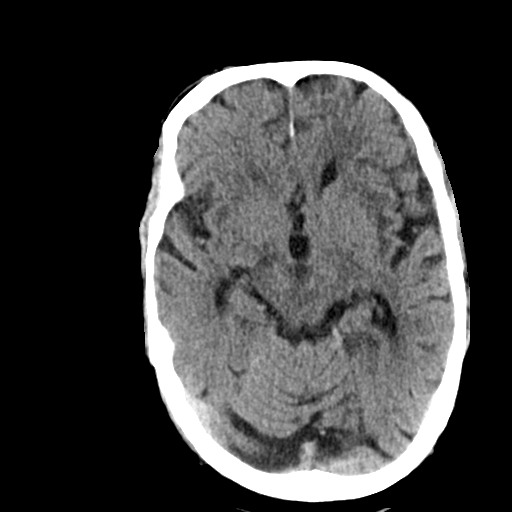
[im 20/37  brain]
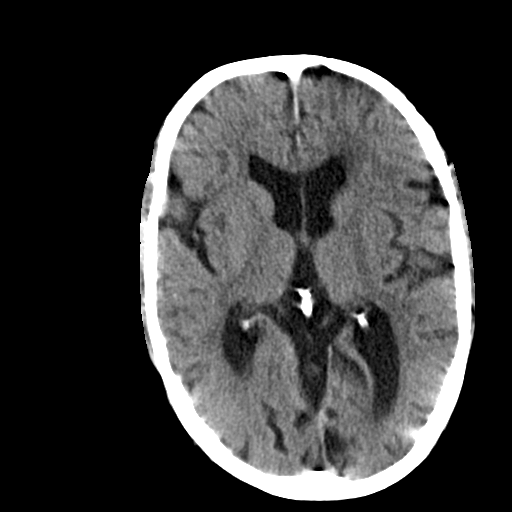
[im 20/37  bone]
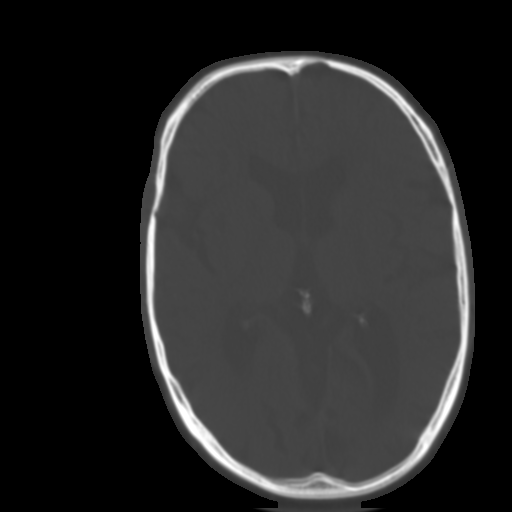
[im 25/37  brain]
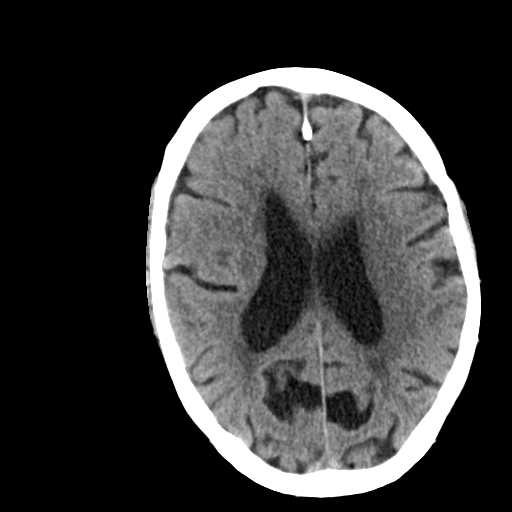
[im 29/37  brain]
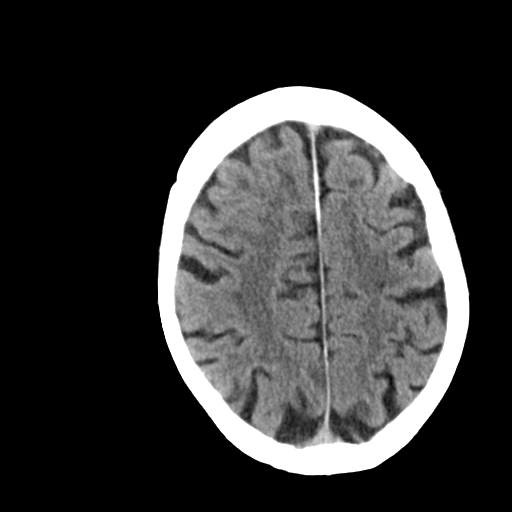
[im 34/37  brain]
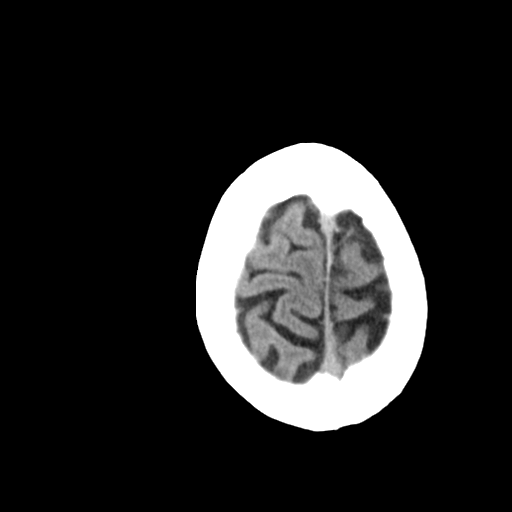

[Series 5: coronal soft tissue · coronal · 0.35mm/px · 3 of 84 slices shown]
[im 28/84  brain]
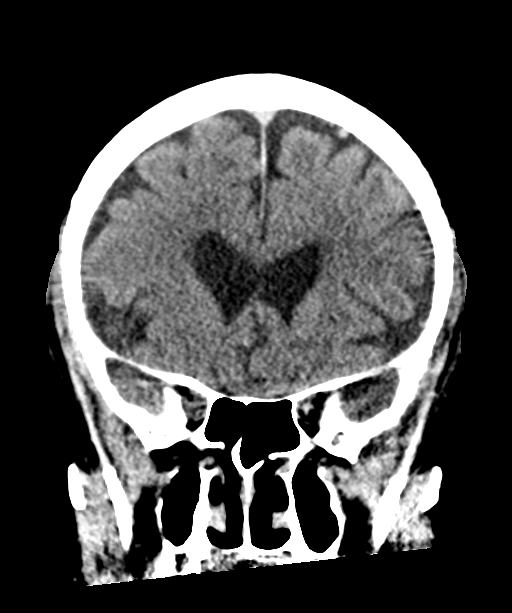
[im 37/84  brain]
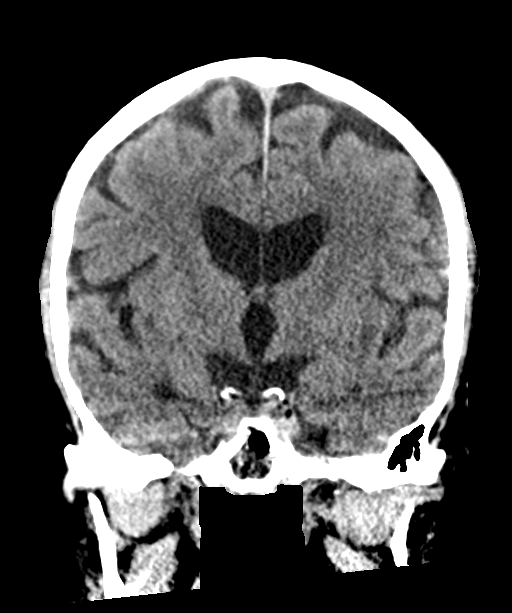
[im 47/84  brain]
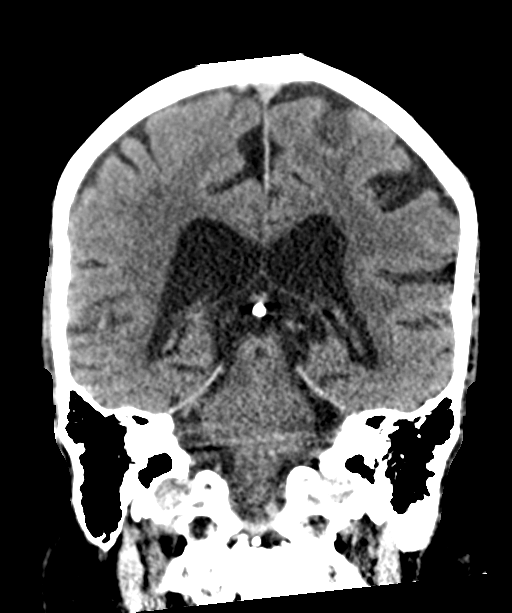

[Series 6: sagittal soft tissue · sagittal · 0.41mm/px · 3 of 60 slices shown]
[im 20/60  brain]
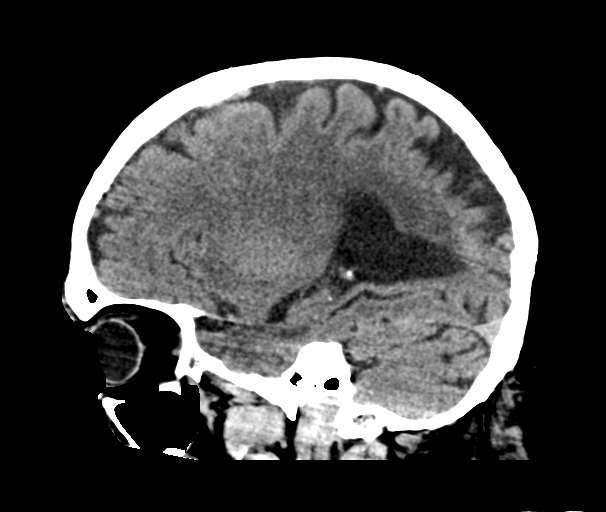
[im 30/60  brain]
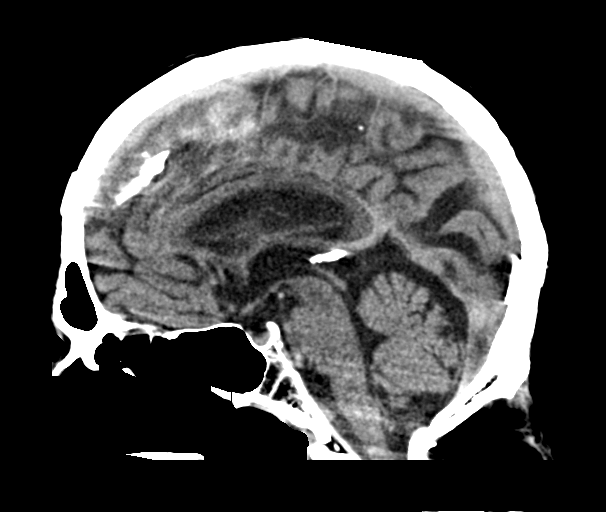
[im 40/60  brain]
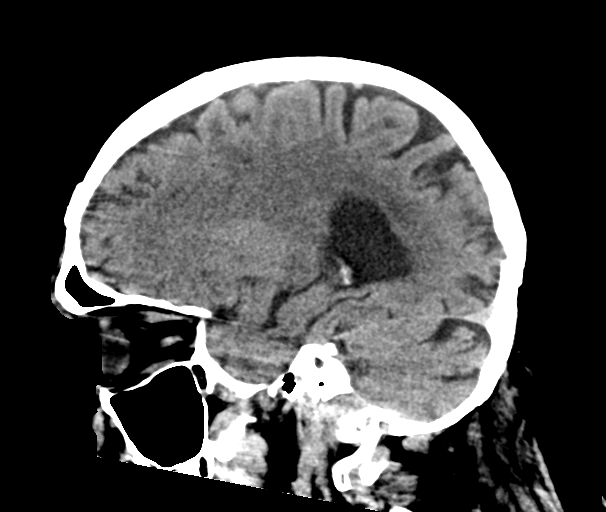

[14 of 47 positions shown; findings below may reference images not displayed]

FINDINGS: Brain: Mild motion. Disproportionate volume loss along the
pericallosal and parietooccipital sulci.

No midline shift, ventriculomegaly, mass effect, evidence of mass
lesion, intracranial hemorrhage or evidence of cortically based
acute infarction. Mildly asymmetric white matter hypodensity greater
in the left hemisphere, left parietal lobe. Small chronic infarct
left lateral cerebellum.

Vascular: Calcified atherosclerosis at the skull base. No suspicious
intracranial vascular hyperdensity.

Skull: No acute osseous abnormality identified.

Sinuses/Orbits: Visualized paranasal sinuses and mastoids are clear.

Other: No acute orbit or scalp soft tissue finding.

ASPECTS ([place] Stroke Program Early CT Score)

Total score (0-10 with 10 being normal): 10
IMPRESSION: 1. No acute cortically based infarct or acute intracranial
hemorrhage identified. ASPECTS 10.

2. Asymmetric cerebral white matter disease greater in the left
hemisphere. Small chronic left cerebellar infarct.

ADDENDUM:
Study discussed by telephone with Dr. ISMAT on [DATE] at
[5G] hours.

*** End of Addendum ***
FINDINGS: Brain: Mild motion. Disproportionate volume loss along the
pericallosal and parietooccipital sulci.

No midline shift, ventriculomegaly, mass effect, evidence of mass
lesion, intracranial hemorrhage or evidence of cortically based
acute infarction. Mildly asymmetric white matter hypodensity greater
in the left hemisphere, left parietal lobe. Small chronic infarct
left lateral cerebellum.

Vascular: Calcified atherosclerosis at the skull base. No suspicious
intracranial vascular hyperdensity.

Skull: No acute osseous abnormality identified.

Sinuses/Orbits: Visualized paranasal sinuses and mastoids are clear.

Other: No acute orbit or scalp soft tissue finding.

ASPECTS ([place] Stroke Program Early CT Score)

Total score (0-10 with 10 being normal): 10
IMPRESSION: 1. No acute cortically based infarct or acute intracranial
hemorrhage identified. ASPECTS 10.

2. Asymmetric cerebral white matter disease greater in the left
hemisphere. Small chronic left cerebellar infarct.

## 2021-02-12 IMAGING — DX DG SHOULDER 2+V*L*
3 series · 3 of 3 positions shown · non-contrast
Comparison: None.

CLINICAL DATA: Fall this morning. Left shoulder pain. Initial
encounter.

EXAM:
LEFT SHOULDER - 2+ VIEW

[shoulder axial]
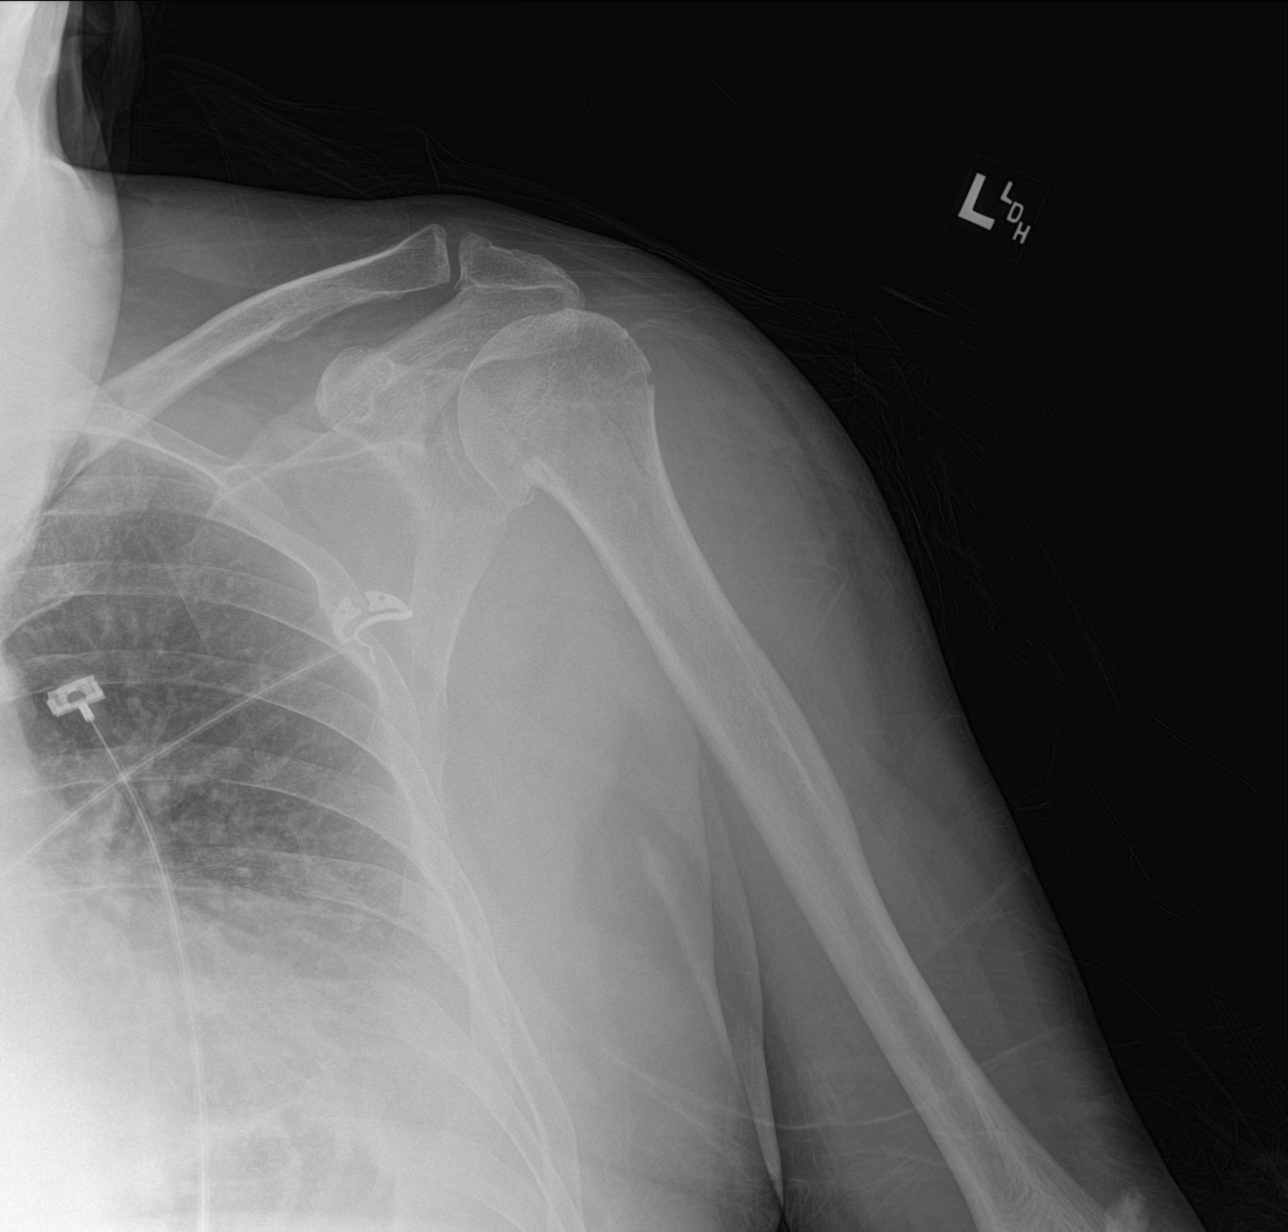

[shoulder obl (1 of 2)]
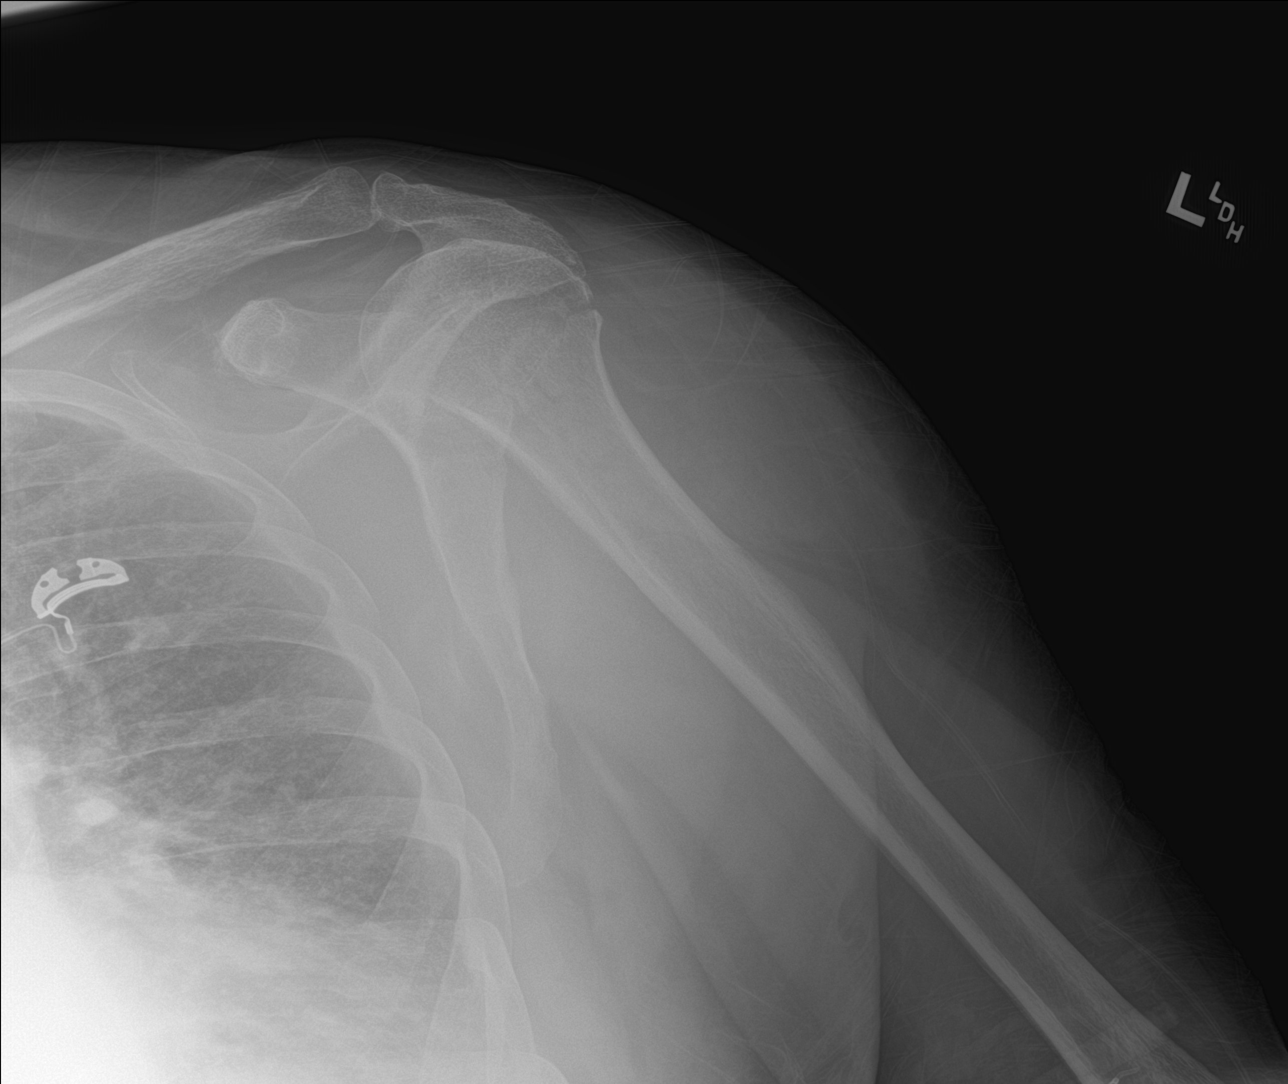

[shoulder obl (2 of 2)]
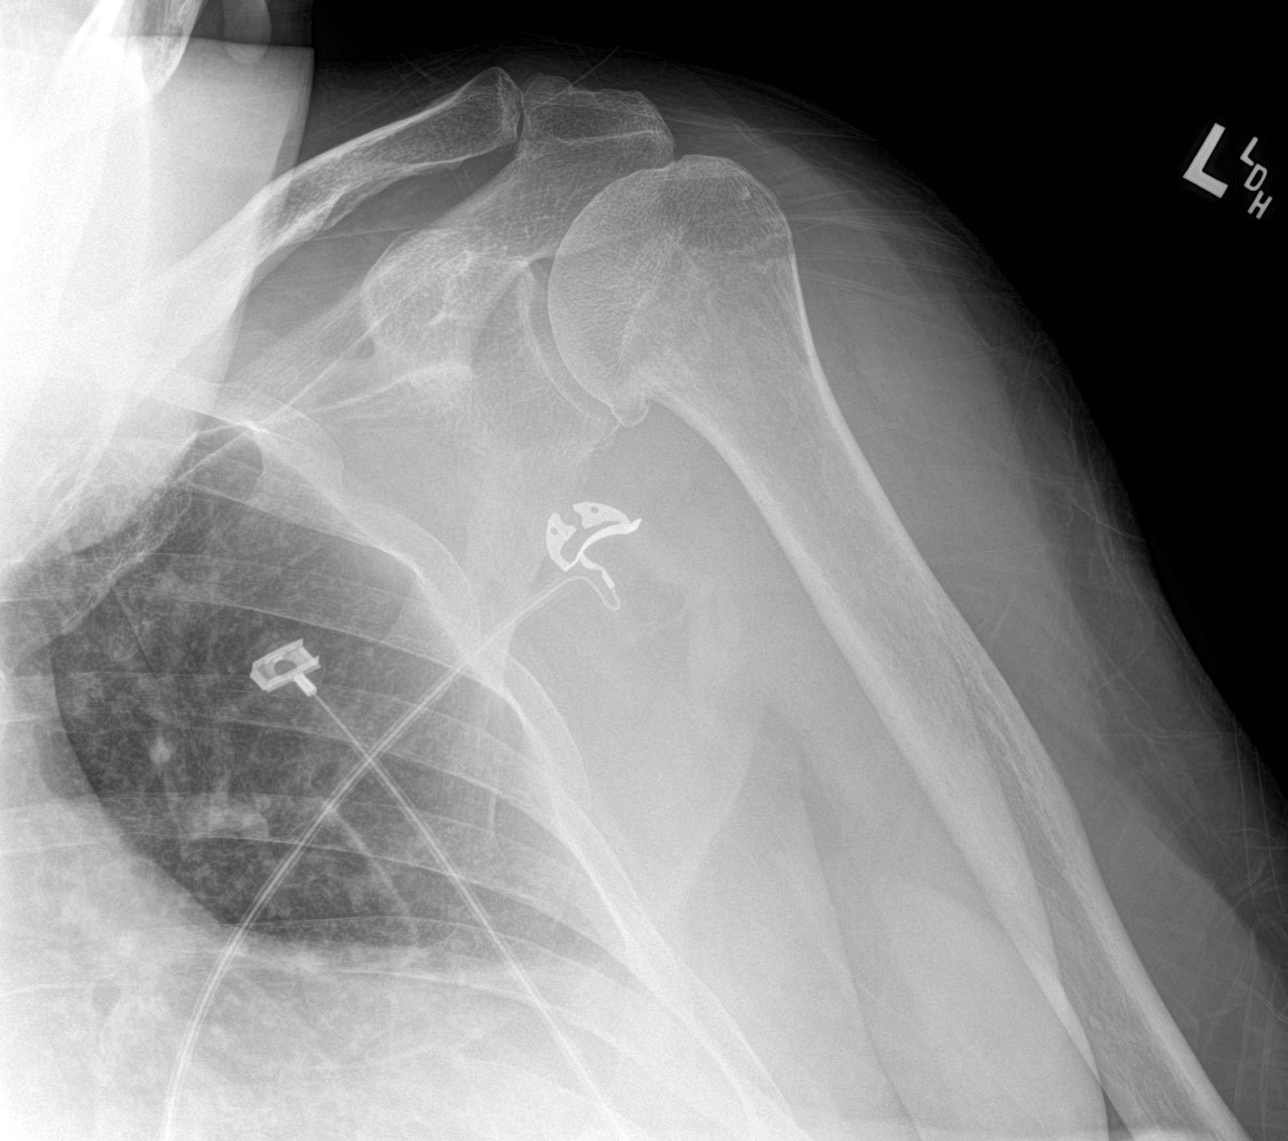

[3 of 3 positions shown; findings below may reference images not displayed]

FINDINGS: A mildly comminuted and minimally displaced fracture is seen
involving the left humeral neck. No evidence of dislocation.

Degenerative spurring is seen along the inferior margin of the
humeral head, without significant joint space narrowing.
IMPRESSION: Mildly comminuted and minimally displaced left humeral neck
fracture.

## 2021-02-12 IMAGING — DX DG CHEST 1V PORT
1 series · 1 of 1 positions shown · non-contrast
Comparison: None.

CLINICAL DATA: Fall out of bed this morning.  Weakness.

EXAM:
PORTABLE CHEST 1 VIEW

[chest ap]
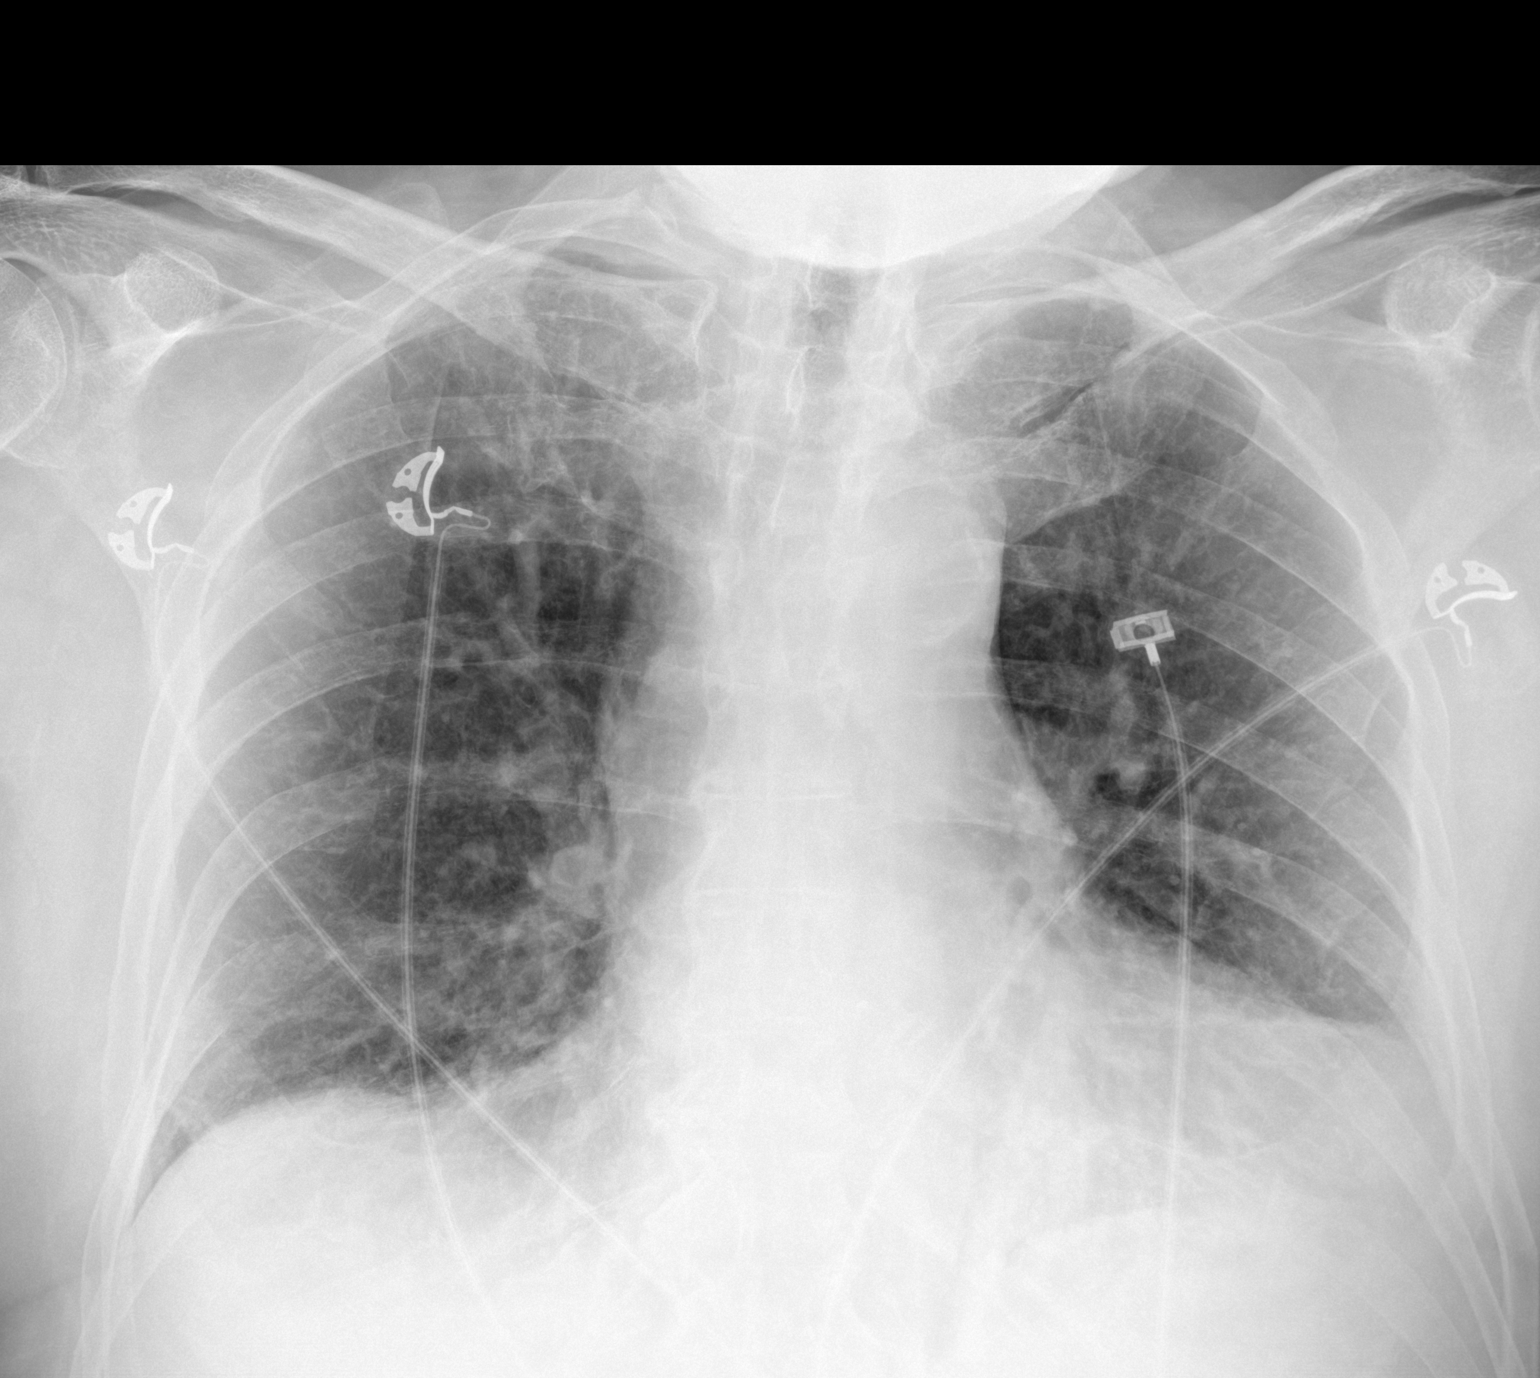

[1 of 1 positions shown; findings below may reference images not displayed]

FINDINGS: Mild cardiomegaly and ectasia of the thoracic aorta noted. Aortic
atherosclerotic calcification noted. Band like opacity in the medial
left lung base favors atelectasis or infiltrate. No evidence of
pleural effusion. The visualized skeletal structures are
unremarkable.
IMPRESSION: Atelectasis versus infiltrate in medial left lung base.

## 2021-02-12 IMAGING — MR MR HEAD W/O CM
11 series · 48 of 48 positions shown · non-contrast
Comparison: Head CT and CTA earlier today.

CLINICAL DATA: 75-year-old male code stroke presentation. Left side
symptoms.

EXAM:
MRI HEAD WITHOUT CONTRAST
TECHNIQUE: Multiplanar, multiecho pulse sequences of the brain and surrounding
structures were obtained without intravenous contrast.

[Series 13: ax dwi_tracew · axial · 3.0mm · 0.71mm/px · z∈[-87,+65]mm · 5 of 56 slices shown]
[im 1/56]
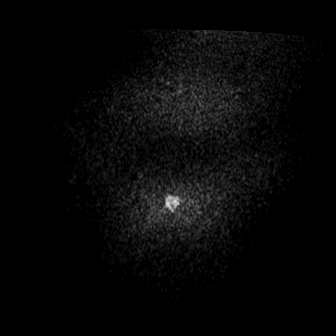
[im 14/56]
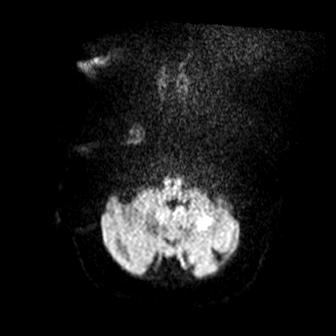
[im 28/56]
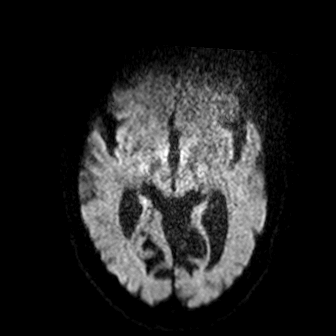
[im 42/56]
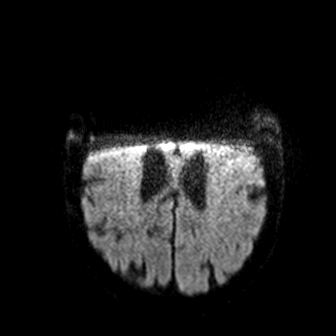
[im 56/56]
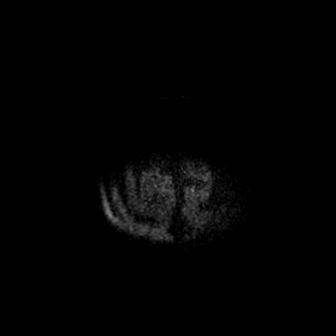

[Series 14: ax dwi_adc · axial · 3.0mm · 0.71mm/px · z∈[-87,+65]mm · 5 of 55 slices shown]
[im 1/55]
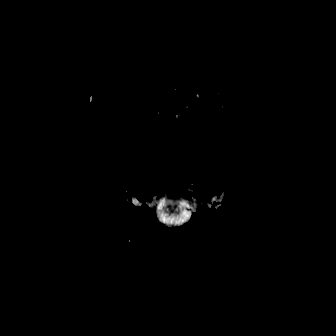
[im 14/55]
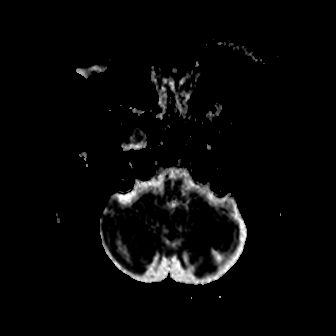
[im 28/55]
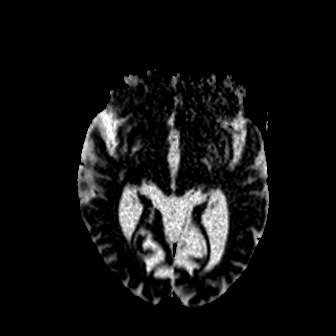
[im 41/55]
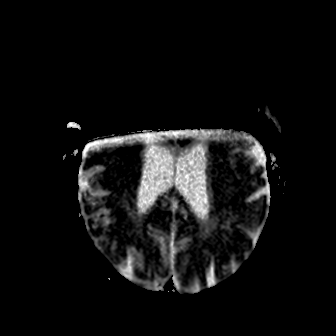
[im 55/55]
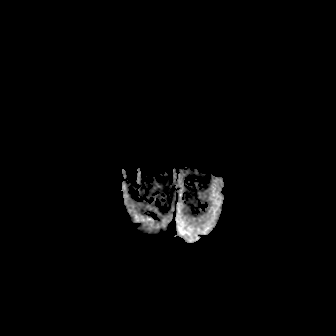

[Series 15: cor dwi_tracew · coronal · 5.0mm · 0.68mm/px · 3 of 40 slices shown]
[im 1/40]
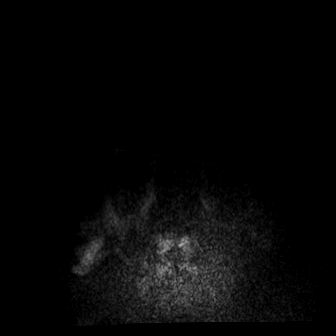
[im 20/40]
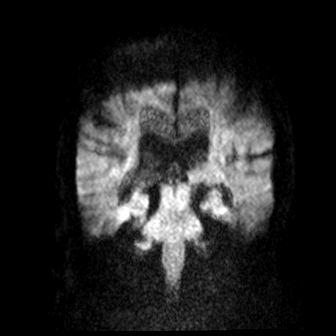
[im 40/40]
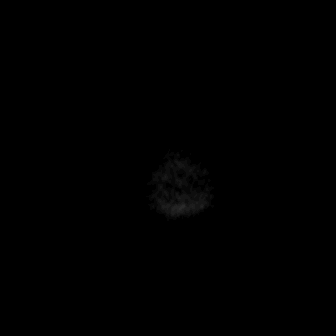

[Series 16: cor dwi_adc · coronal · 5.0mm · 0.68mm/px · 3 of 39 slices shown]
[im 1/39]
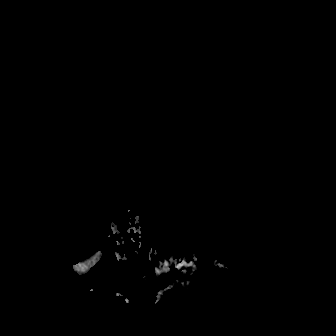
[im 20/39]
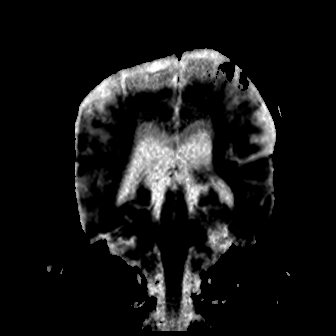
[im 39/39]
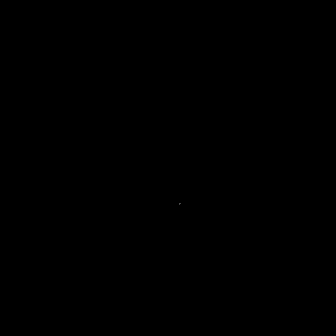

[Series 17: T1 · sagittal · 5.0mm · 0.62mm/px · 2 of 23 slices shown (1 of 2)]
[im 1/23]
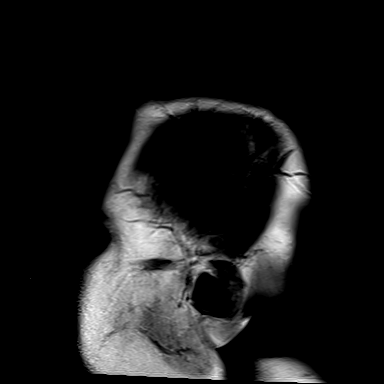
[im 23/23]
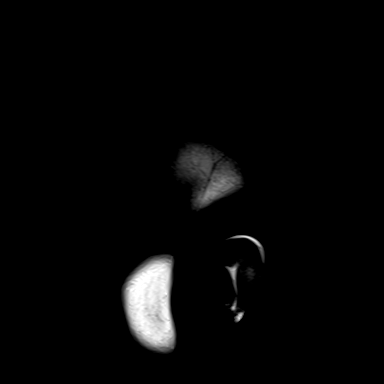

[Series 18: T2 · axial · 5.0mm · 0.86mm/px · z∈[-86,+69]mm · 2 of 29 slices shown (1 of 2)]
[im 1/29]
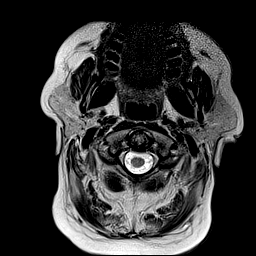
[im 29/29]
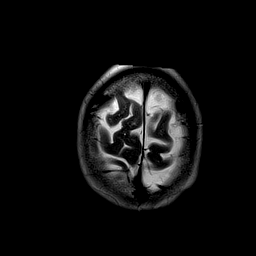

[Series 20: pha_images · axial · 3.0mm · 0.90mm/px · z∈[-85,+68]mm · 4 of 56 slices shown]
[im 1/56]
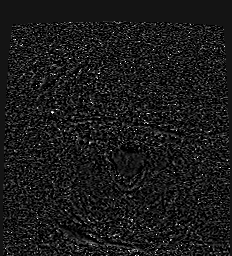
[im 19/56]
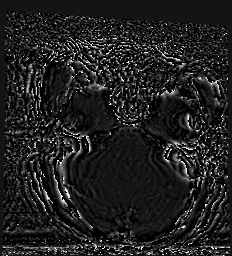
[im 37/56]
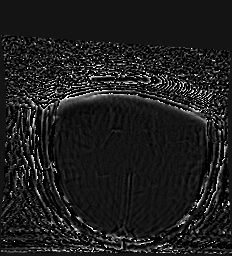
[im 56/56]
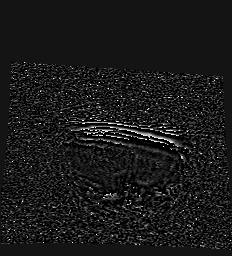

[Series 21: swi_images · axial · 3.0mm · 0.90mm/px · z∈[-85,+68]mm · 4 of 56 slices shown]
[im 1/56]
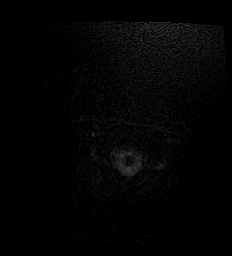
[im 19/56]
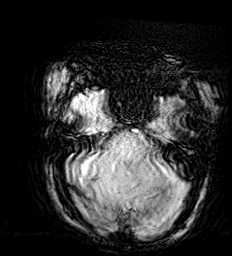
[im 37/56]
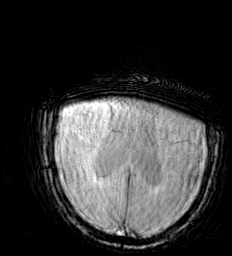
[im 56/56]
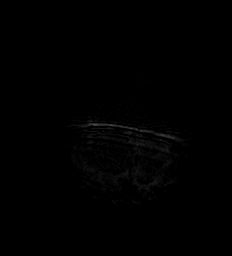

[Series 23: FLAIR · axial · 3.0mm · 0.69mm/px · z∈[-74,+75]mm · 4 of 55 slices shown]
[im 1/55]
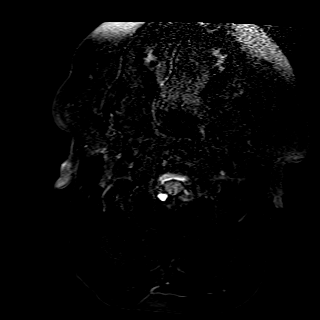
[im 19/55]
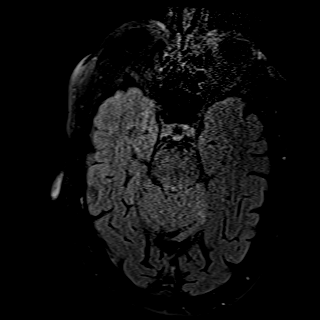
[im 37/55]
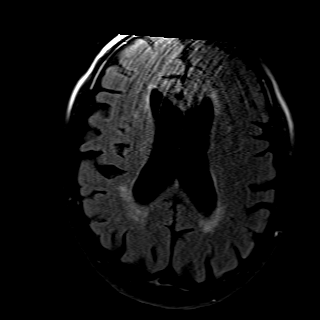
[im 55/55]
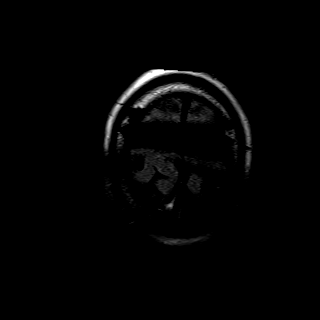

[Series 24: T1 · axial · 1.0mm · 0.98mm/px · z∈[-91,+71]mm · 14 of 176 slices shown (2 of 2)]
[im 1/176]
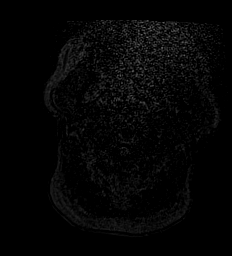
[im 14/176]
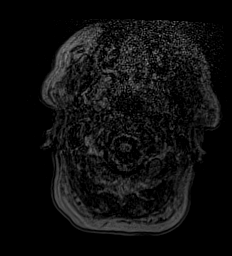
[im 27/176]
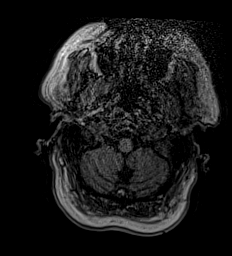
[im 41/176]
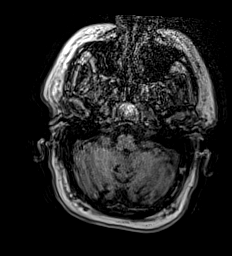
[im 54/176]
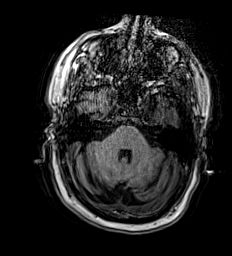
[im 68/176]
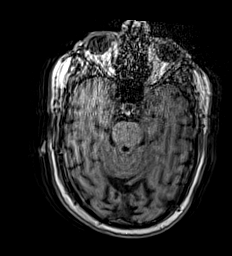
[im 81/176]
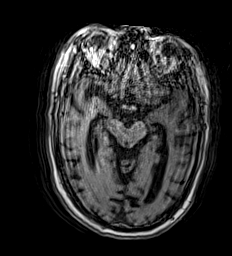
[im 95/176]
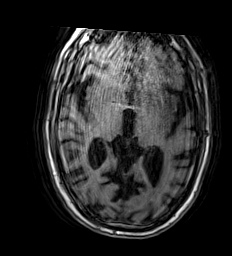
[im 108/176]
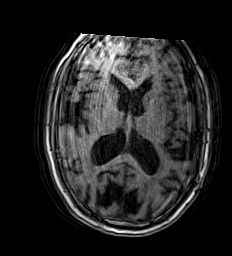
[im 122/176]
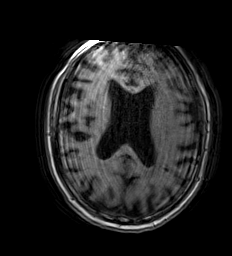
[im 135/176]
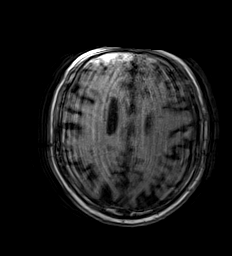
[im 149/176]
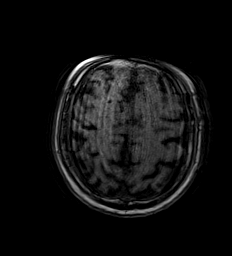
[im 162/176]
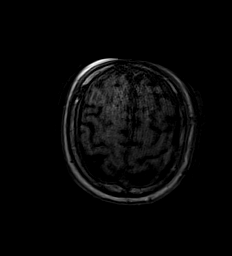
[im 176/176]
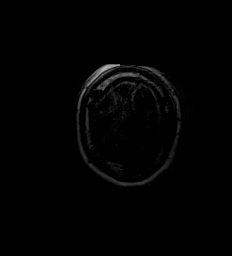

[Series 25: T2 · coronal · 5.0mm · 0.86mm/px · 2 of 30 slices shown (2 of 2)]
[im 1/30]
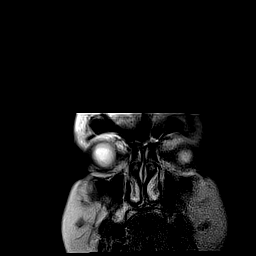
[im 30/30]
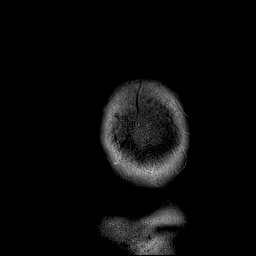

[48 of 48 positions shown; findings below may reference images not displayed]

FINDINGS: Brain: Suboptimal related to patient spinal kyphosis. Technologist
was unable to use dedicated head coil from brain imaging.

Patchy roughly 2 cm area of restricted diffusion in the left
superior cerebellar artery territory. See series 15, image 16 and
series 13, image 21. Faint T2 and FLAIR hyperintensity in that area.
No hemorrhage or mass effect.

Nearby small chronic posterior left cerebellar infarct as seen by
CT. No other restricted diffusion.

No midline shift, mass effect, evidence of mass lesion,
ventriculomegaly, extra-axial collection or acute intracranial
hemorrhage is evident. Cervicomedullary junction and pituitary are
within normal limits.

Mild for age patchy bilateral cerebral white matter T2 and FLAIR
hyperintensity. Negative deep gray matter nuclei and brainstem.

Vascular: Major intracranial vascular flow voids are preserved.

Skull and upper cervical spine: Visualized bone marrow signal is
within normal limits.

Sinuses/Orbits: Postoperative changes to both globes, otherwise
negative.

Other: Mastoids are clear.  Negative visible scalp and face.
IMPRESSION: 1. Acute on chronic left cerebellar ischemia: Acute infarct in the
Left SCA territory. No associated hemorrhage or mass effect.
2. Mildly limited as unable to use dedicated head coil for brain
imaging due to patient kyphosis.
3. No other acute intracranial abnormality.

## 2021-02-12 IMAGING — CR DG LUMBAR SPINE 2-3V
1 series · 3 of 3 positions shown · non-contrast
Comparison: None.

CLINICAL DATA: Fall out of bed last night, weakness, pain

EXAM:
LUMBAR SPINE - 2-3 VIEW

[Series 1: dg lumbar spine 2-3 views · 0.14mm/px · 3 of 3 slices shown]
[im 1/3]
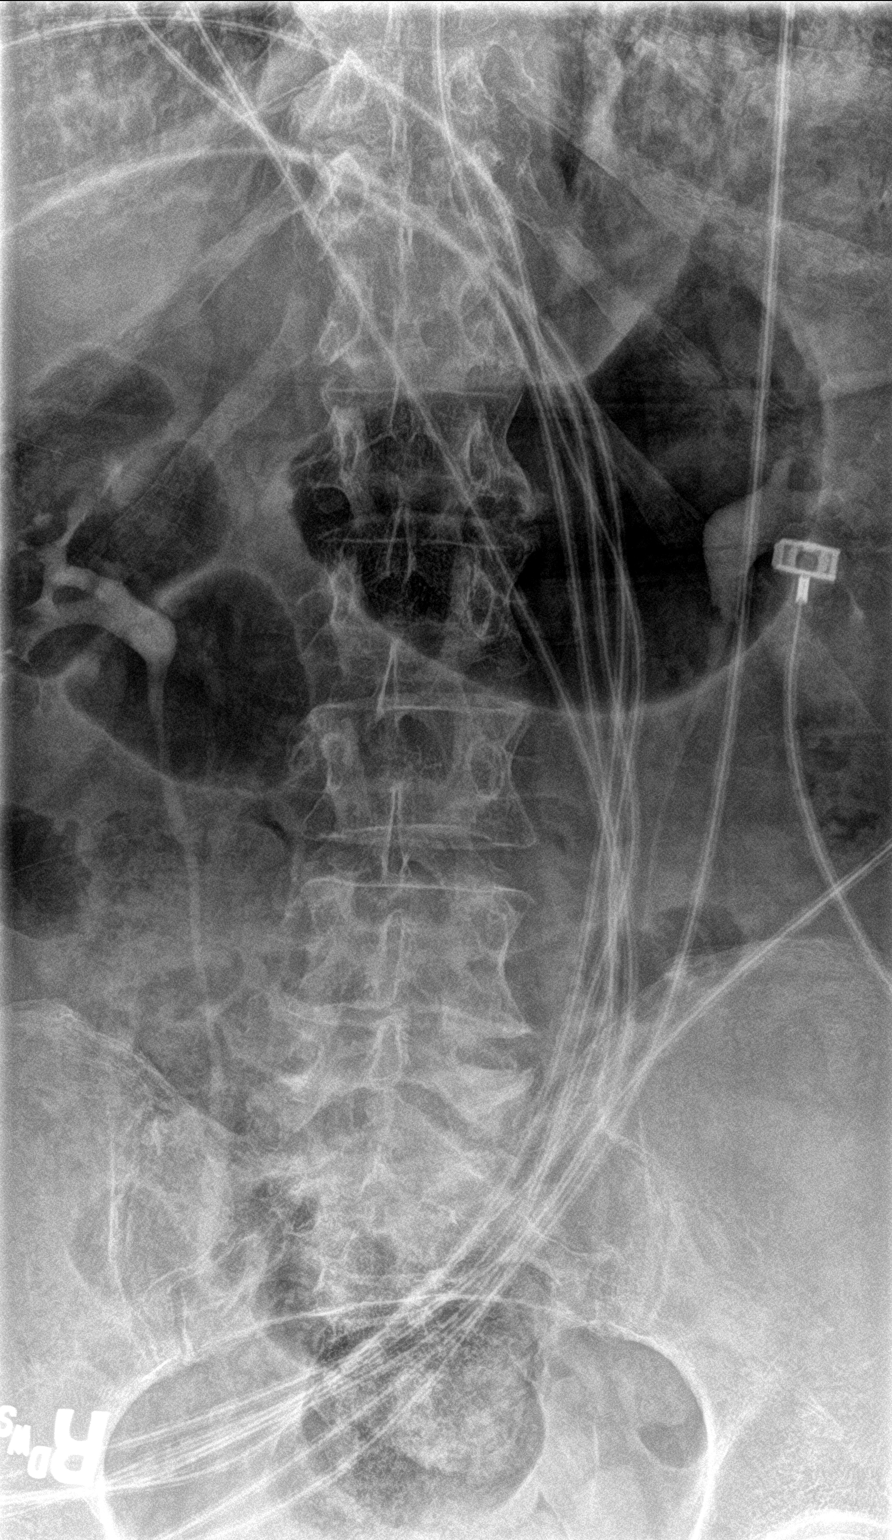
[im 2/3]
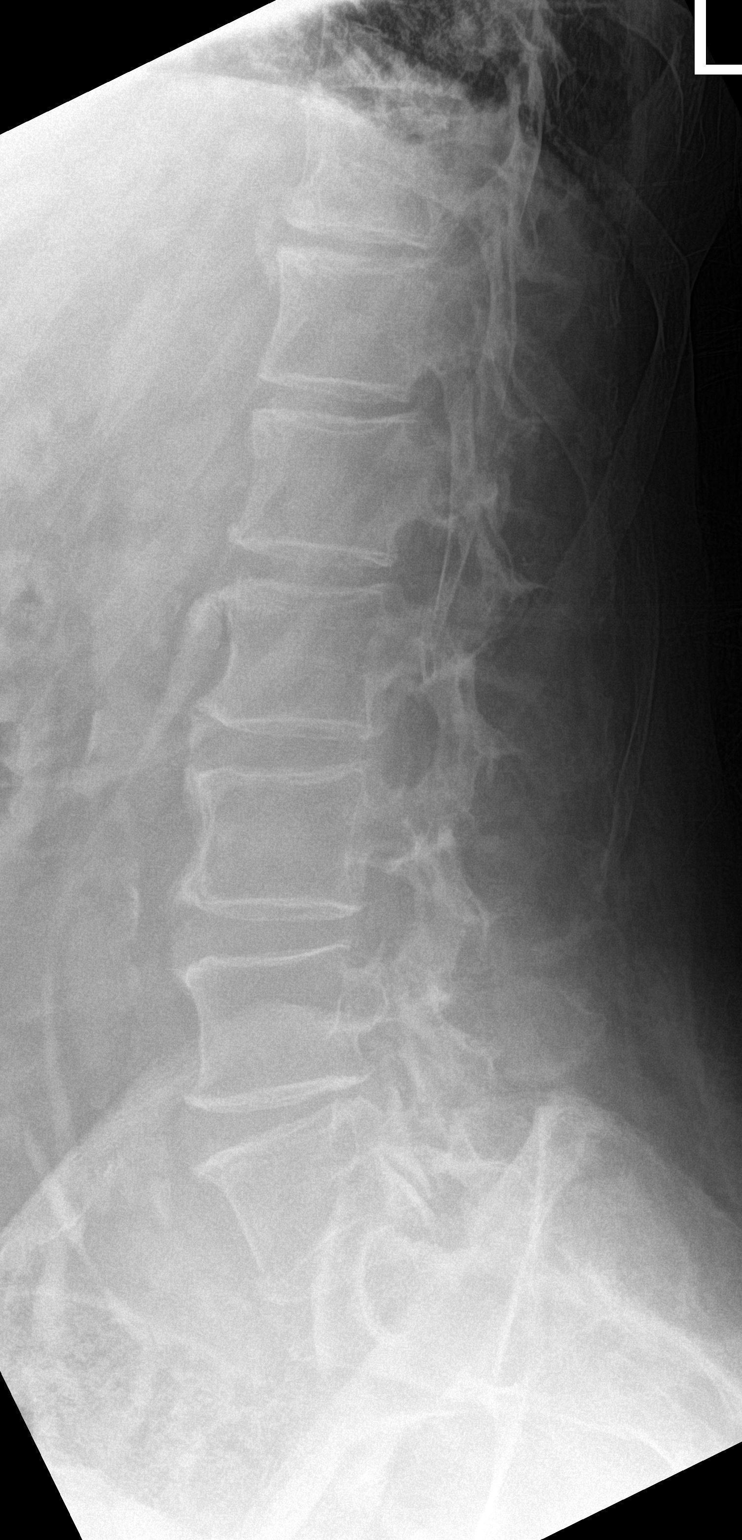
[im 3/3]
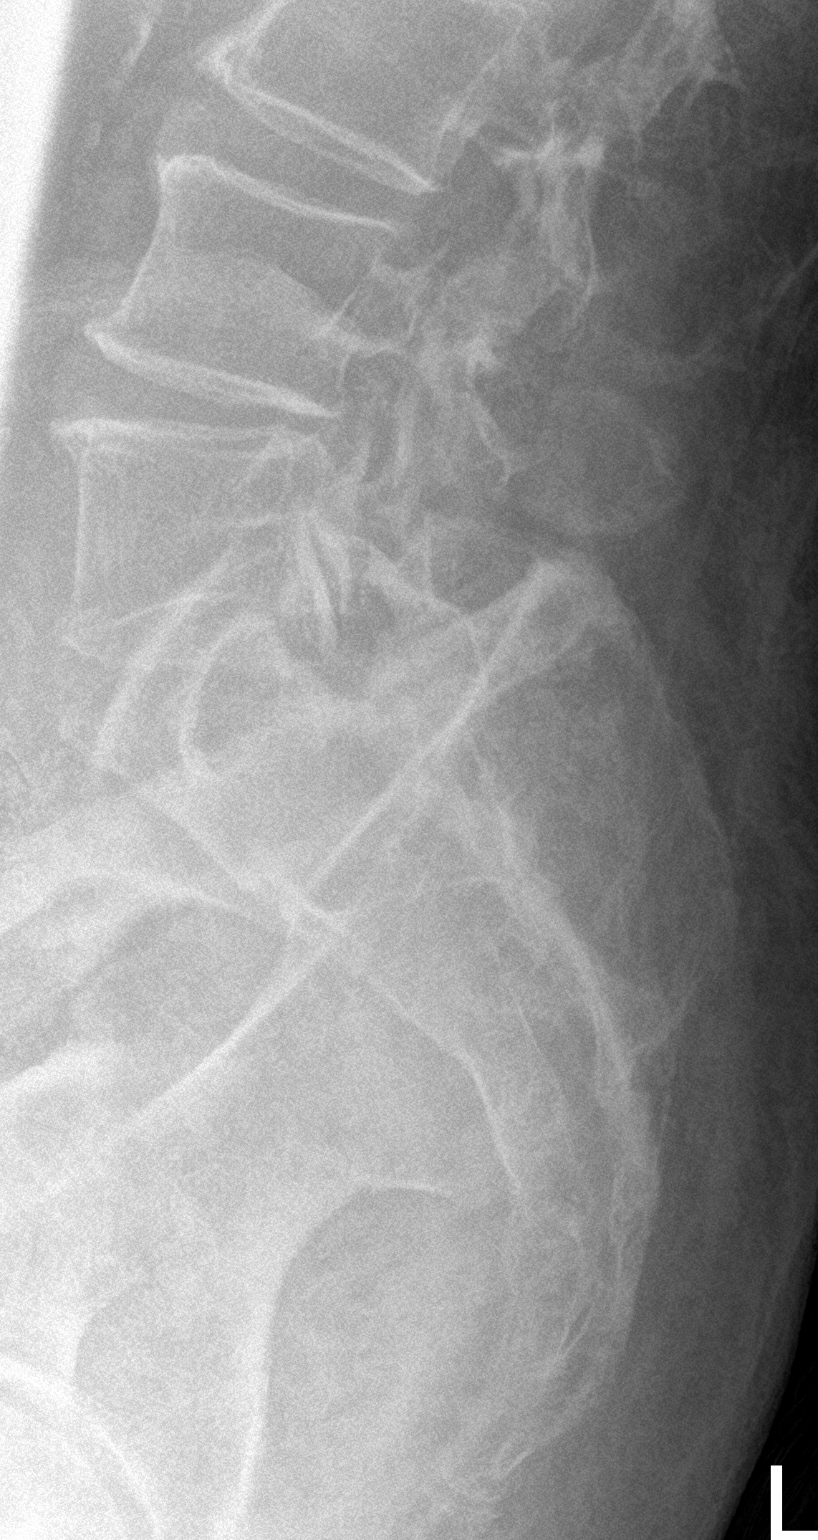

[3 of 3 positions shown; findings below may reference images not displayed]

FINDINGS: This report assumes 5 non rib-bearing lumbar vertebrae.

Lumbar vertebral body heights are preserved, with no fracture.

Mild multilevel lumbar degenerative disc disease. No
spondylolisthesis. No appreciable facet arthropathy. No aggressive
appearing focal osseous lesions. Excreted contrast is seen in the
renal collecting systems bilaterally.
IMPRESSION: No lumbar spine fracture or spondylolisthesis. Mild multilevel
lumbar degenerative disc disease.

## 2021-02-12 IMAGING — CT CT ANGIO HEAD-NECK (W OR W/O PERF)
3 of 7 series · 10 of 35 positions shown · IV contrast (APPLIED)
Comparison: Plain head CT [RE] hours today.

CLINICAL DATA: 75-year-old male code stroke presentation. Left side
weakness.

EXAM:
CT ANGIOGRAPHY HEAD AND NECK
TECHNIQUE: Multidetector CT imaging of the head and neck was performed using
the standard protocol during bolus administration of intravenous
contrast. Multiplanar CT image reconstructions and MIPs were
obtained to evaluate the vascular anatomy. Carotid stenosis
measurements (when applicable) are obtained utilizing NASCET
criteria, using the distal internal carotid diameter as the
denominator.
CONTRAST:  75mL OMNIPAQUE IOHEXOL 350 MG/ML SOLN

[Series 4: cta head neck · axial · 0.89mm/px · z∈[-206,-86]mm · 2 of 182 slices shown]
[im 61/182  soft-tissue]
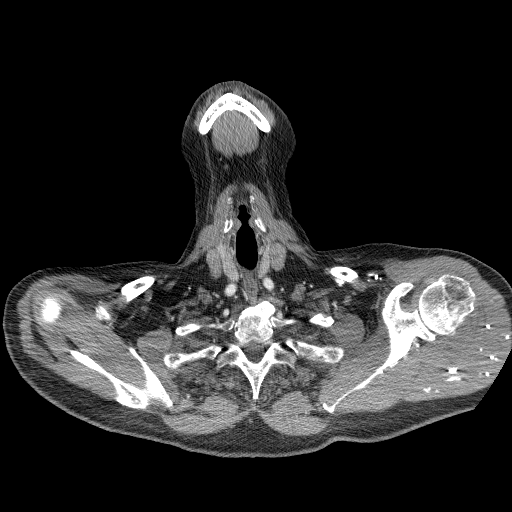
[im 121/182  soft-tissue]
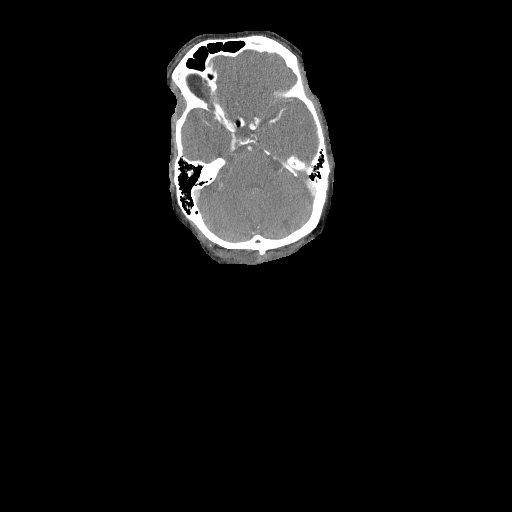

[Series 6: ax thin · axial · 0.83mm/px · z∈[-266,+3]mm · 6 of 378 slices shown]
[im 54/378  soft-tissue]
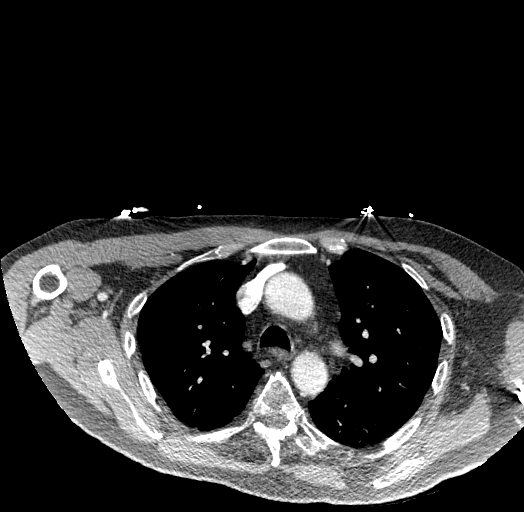
[im 108/378  bone]
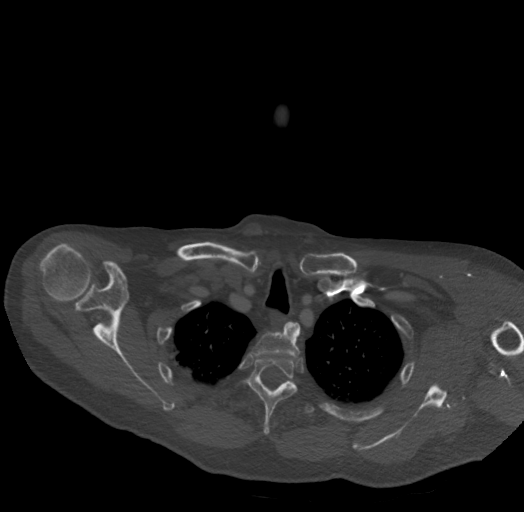
[im 162/378  soft-tissue]
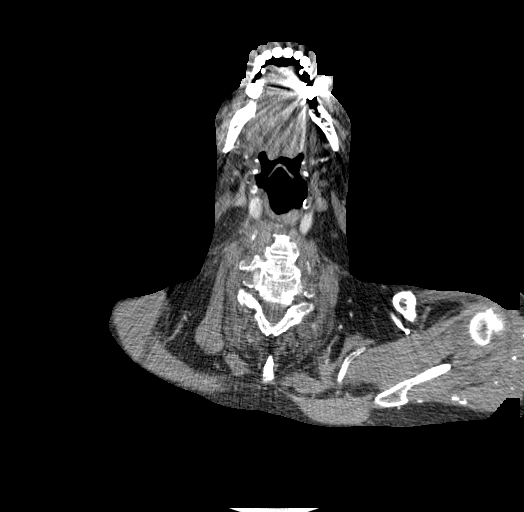
[im 216/378  bone]
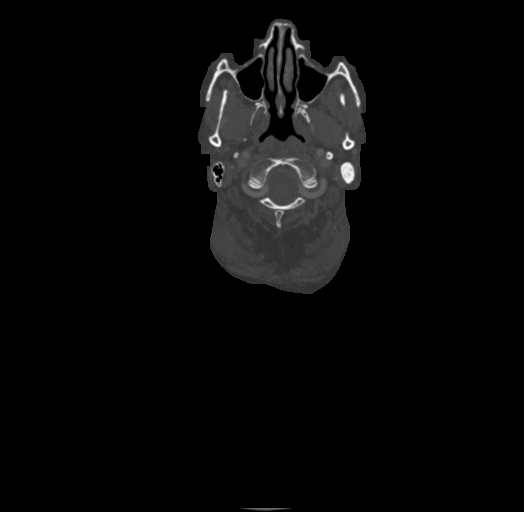
[im 270/378  soft-tissue]
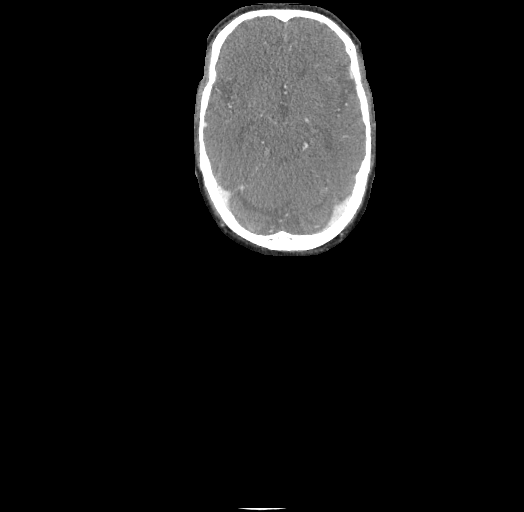
[im 324/378  bone]
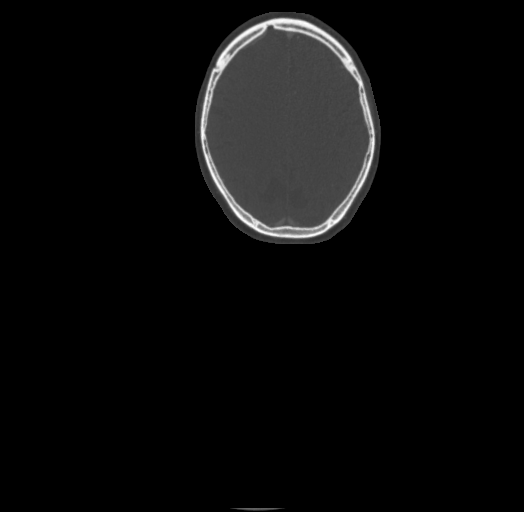

[Series 8: sagittal thin · sagittal · 0.74mm/px · 2 of 431 slices shown]
[im 152/431  soft-tissue]
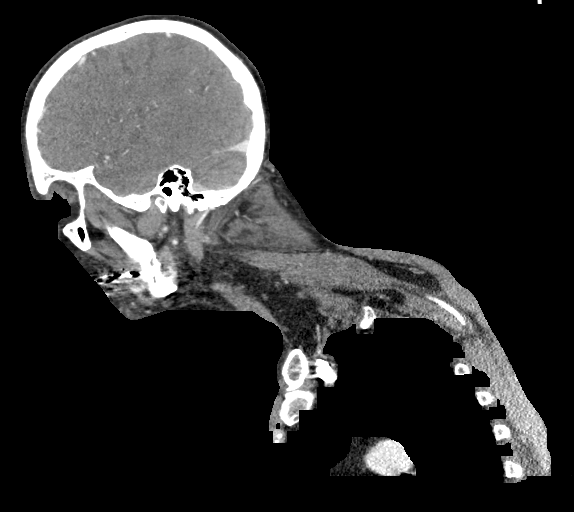
[im 277/431  soft-tissue]
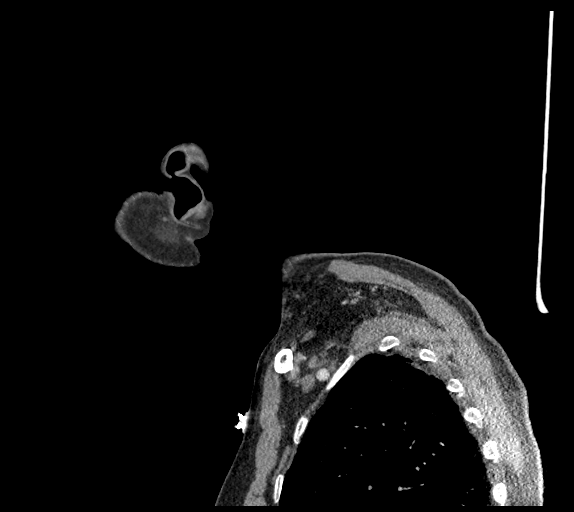

[10 of 35 positions shown; findings below may reference images not displayed]

FINDINGS: CTA NECK

Skeleton: Exaggerated thoracic kyphosis and reversal of cervical
lordosis. Widespread interbody ankylosis in the lower cervical and
upper thoracic spine from bulky flowing endplate osteophytes. No
acute osseous abnormality identified.

Upper chest: Minor atelectasis, otherwise negative. Central
pulmonary arteries appear to be patent.

Other neck: Atrophied bilateral submandibular glands. No acute
finding in the neck.

Aortic arch: 3 vessel arch configuration with mild calcified arch
atherosclerosis.

Right carotid system: Negative.

Left carotid system: Minimal plaque at the posterior left ICA
origin. No stenosis and otherwise negative.

Vertebral arteries:
Mild calcified plaque at the right subclavian origin without
stenosis. Negative right vertebral artery origin and cervical right
vertebral artery.

Minimal calcified plaque at the left subclavian artery origin
without stenosis. Negative left vertebral artery origin and cervical
left vertebral artery.

CTA HEAD

Posterior circulation: Mildly dominant left V4 segment. Patent
distal vertebral arteries to the vertebrobasilar junction without
plaque or stenosis. Patent PICA origins. Patent basilar artery
without stenosis. Patent SCA and PCA origins. Posterior
communicating arteries are diminutive or absent. Bilateral PCA
branches are within normal limits.

Anterior circulation: Both ICA siphons are patent. Mild supraclinoid
calcified plaque bilaterally with no significant siphon stenosis.
Patent carotid termini. Normal MCA and ACA origins. Anterior
communicating artery and bilateral ACA branches are within normal
limits. Left MCA M1 segment and trifurcation appear patent without
stenosis. Right MCA M1 segment and bifurcation appear patent without
stenosis. Bilateral MCA branches are within normal limits.

Venous sinuses: Patent.

Anatomic variants: Mildly dominant left vertebral artery.

Review of the MIP images confirms the above findings
IMPRESSION: 1. Negative for large vessel occlusion.
2. Very mild for age atherosclerosis in the head and neck. No
significant arterial stenosis.
Mild aortic atherosclerosis.
3. Multilevel ankylosis in the lower cervical and upper thoracic
spine from bulky flowing endplate osteophytes, Diffuse idiopathic
skeletal hyperostosis (DISH).

## 2021-02-12 MED ORDER — ASPIRIN 81 MG PO CHEW
81.0000 mg | CHEWABLE_TABLET | Freq: Once | ORAL | Status: AC
  Administered 2021-02-12: 81 mg via ORAL
  Filled 2021-02-12: qty 1

## 2021-02-12 MED ORDER — ACETAMINOPHEN 325 MG PO TABS: 650.0000 mg | ORAL_TABLET | Freq: Four times a day (QID) | ORAL | Status: DC | PRN

## 2021-02-12 MED ORDER — IOHEXOL 350 MG/ML SOLN
75.0000 mL | Freq: Once | INTRAVENOUS | Status: AC | PRN
  Administered 2021-02-12: 75 mL via INTRAVENOUS

## 2021-02-12 MED ORDER — FENTANYL CITRATE PF 50 MCG/ML IJ SOSY: 50.0000 ug | PREFILLED_SYRINGE | Freq: Once | INTRAMUSCULAR | Status: DC

## 2021-02-12 MED ORDER — HYDRALAZINE HCL 20 MG/ML IJ SOLN: 5.0000 mg | INTRAMUSCULAR | Status: DC | PRN

## 2021-02-12 MED ORDER — ONDANSETRON HCL 4 MG/2ML IJ SOLN
4.0000 mg | Freq: Once | INTRAMUSCULAR | Status: AC
  Administered 2021-02-12: 4 mg via INTRAVENOUS

## 2021-02-12 MED ORDER — ASPIRIN 81 MG PO CHEW
81.0000 mg | CHEWABLE_TABLET | Freq: Once | ORAL | Status: AC
  Administered 2021-02-13: 10:00:00 81 mg via ORAL
  Filled 2021-02-12: qty 1

## 2021-02-12 MED ORDER — ACETAMINOPHEN 325 MG PO TABS: 650.0000 mg | ORAL_TABLET | Freq: Once | ORAL | Status: DC

## 2021-02-12 MED ORDER — STROKE: EARLY STAGES OF RECOVERY BOOK: Freq: Once | Status: AC

## 2021-02-12 MED ORDER — OXYCODONE-ACETAMINOPHEN 5-325 MG PO TABS: 1.0000 | ORAL_TABLET | ORAL | Status: DC | PRN

## 2021-02-12 MED ORDER — METHOCARBAMOL 500 MG PO TABS
500.0000 mg | ORAL_TABLET | Freq: Three times a day (TID) | ORAL | Status: DC | PRN
  Filled 2021-02-12: qty 1

## 2021-02-12 MED ORDER — POTASSIUM CHLORIDE CRYS ER 20 MEQ PO TBCR
40.0000 meq | EXTENDED_RELEASE_TABLET | Freq: Once | ORAL | Status: AC
  Administered 2021-02-12: 40 meq via ORAL
  Filled 2021-02-12: qty 2

## 2021-02-12 MED ORDER — MORPHINE SULFATE (PF) 2 MG/ML IV SOLN: 1.0000 mg | INTRAVENOUS | Status: DC | PRN

## 2021-02-12 MED ORDER — HYDROCODONE-ACETAMINOPHEN 5-325 MG PO TABS: 1.0000 | ORAL_TABLET | Freq: Once | ORAL | Status: DC

## 2021-02-12 MED ORDER — ATORVASTATIN CALCIUM 20 MG PO TABS
40.0000 mg | ORAL_TABLET | Freq: Every day | ORAL | Status: DC
  Administered 2021-02-12 – 2021-02-15 (×4): 40 mg via ORAL
  Filled 2021-02-12 (×4): qty 2

## 2021-02-12 MED ORDER — ONDANSETRON HCL 4 MG/2ML IJ SOLN
4.0000 mg | Freq: Three times a day (TID) | INTRAMUSCULAR | Status: DC | PRN
  Administered 2021-02-12 – 2021-02-13 (×2): 4 mg via INTRAVENOUS
  Filled 2021-02-12 (×2): qty 2

## 2021-02-12 MED ORDER — SENNOSIDES-DOCUSATE SODIUM 8.6-50 MG PO TABS: 1.0000 | ORAL_TABLET | Freq: Every evening | ORAL | Status: DC | PRN

## 2021-02-12 NOTE — ED Notes (Addendum)
Wife at bedside. Per pt's wife, pt was getting ready for the day and went to the bathroom  at 0445 and pt was complaining of dizziness  with ringing in pt's ear and fell. Wife helped pt back to bed. Around 0630 that's when wife noticed pt had a slurred speech and left arm weakness and call EMS.

## 2021-02-12 NOTE — Progress Notes (Signed)
   02/12/21 0910  Clinical Encounter Type  Visited With Patient not available  Visit Type Initial  Referral From Chaplain  Consult/Referral To Chaplain  Spiritual Encounters  Spiritual Needs Other (Comment) (not assessed)  Chaplain Burris attempted to f/u on code stroke from this a.m. Spoke with Pt's nurse and likely not a stroke. Allowing Pt to rest at this time due to report of severe pain; let RN know of my availability.

## 2021-02-12 NOTE — Consult Note (Signed)
ORTHOPAEDIC CONSULTATION  REQUESTING PHYSICIAN: Lorretta Harp, MD  Chief Complaint: Left shoulder pain  HPI: Brandon Hooper is a 75 y.o. male who complains of left shoulder pain after falling today.  Patient exhibited signs and symptoms of a stroke earlier today and fell.  He was seen in the emergency room and exam and x-rays revealed a probable fracture of the proximal left humerus.  I reviewed the x-rays and there may be nondisplaced crack in the surgical neck region.  There is mild early spurring.  Patient has been placed in a sling.  Work-up for CVA is progressing.  Patient is fairly comfortable this evening and movement of the shoulder is not very painful.  No other extremities are painful.  Past Medical History:  Diagnosis Date   Arthritis    Bone loss    Hypertension    Past Surgical History:  Procedure Laterality Date   EYE SURGERY Bilateral    Feb 2006 and then in Jan 2009   Social History   Socioeconomic History   Marital status: Married    Spouse name: Not on file   Number of children: Not on file   Years of education: Not on file   Highest education level: Not on file  Occupational History   Not on file  Tobacco Use   Smoking status: Never   Smokeless tobacco: Never  Substance and Sexual Activity   Alcohol use: Not Currently   Drug use: Not on file   Sexual activity: Not on file  Other Topics Concern   Not on file  Social History Narrative   Not on file   Social Determinants of Health   Financial Resource Strain: Not on file  Food Insecurity: Not on file  Transportation Needs: Not on file  Physical Activity: Not on file  Stress: Not on file  Social Connections: Not on file   Family History  Problem Relation Age of Onset   Diabetes Father    Allergies  Allergen Reactions   Erythromycin Hives   Ofloxacin     Other reaction(s): Other (See Comments) "Arthritic pain". The patient reported 5 years of left hip pain when he last took ofloxacin. Likely  could try another fluoroquinolone.    Cephalexin Rash    The patient previously tolerated cephalexin in 2013 and reported in 2019 that he developed redness and itching of his knees while on cephalexin. Not entirely clear it was a true allergic reaction.    Clindamycin Hcl Rash   Doxycycline Calcium Rash   Sulfamethoxazole-Trimethoprim Rash   Tetracycline Rash   Prior to Admission medications   Medication Sig Start Date End Date Taking? Authorizing Provider  lisinopril (ZESTRIL) 10 MG tablet Take 10 mg by mouth daily. Patient not taking: No sig reported 01/27/21   [provider]   DG Lumbar Spine 2-3 Views  Result Date: 02/12/2021 CLINICAL DATA:  Fall out of bed last night, weakness, pain EXAM: LUMBAR SPINE - 2-3 VIEW COMPARISON:  None. FINDINGS: This report assumes 5 non rib-bearing lumbar vertebrae. Lumbar vertebral body heights are preserved, with no fracture. Mild multilevel lumbar degenerative disc disease. No spondylolisthesis. No appreciable facet arthropathy. No aggressive appearing focal osseous lesions. Excreted contrast is seen in the renal collecting systems bilaterally. IMPRESSION: No lumbar spine fracture or spondylolisthesis. Mild multilevel lumbar degenerative disc disease. Electronically Signed   By: Delbert Phenix M.D.   On: 02/12/2021 09:24   MR BRAIN WO CONTRAST  Result Date: 02/12/2021 CLINICAL DATA:  75 year old male code stroke  presentation. Left side symptoms. EXAM: MRI HEAD WITHOUT CONTRAST TECHNIQUE: Multiplanar, multiecho pulse sequences of the brain and surrounding structures were obtained without intravenous contrast. COMPARISON:  Head CT and CTA earlier today. 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 Technologist was unable to use dedicated head coil from brain imaging. Patchy roughly 2 cm area of restricted diffusion in the left superior cerebellar artery territory. See series 15, image 16 and series 13, image 21. Faint T2 and FLAIR  hyperintensity in that area. No hemorrhage or mass effect. Nearby small chronic posterior left cerebellar infarct as seen by CT. No other restricted diffusion. No midline shift, mass effect, evidence of mass lesion, ventriculomegaly, extra-axial collection or acute intracranial hemorrhage is evident. Cervicomedullary junction and pituitary are within normal limits. Mild for age patchy bilateral cerebral white matter T2 and FLAIR hyperintensity. Negative deep gray matter nuclei and brainstem. Vascular: Major intracranial vascular flow voids are preserved. Skull and upper cervical spine: Visualized bone marrow signal is within normal limits. Sinuses/Orbits: Postoperative changes to both globes, otherwise negative. Other: Mastoids are clear.  Negative visible scalp and face. IMPRESSION: 1. 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 No associated hemorrhage or mass effect. 2. Mildly limited as unable to use dedicated head coil for brain imaging due to patient kyphosis. 3. No other acute intracranial abnormality. Electronically Signed   By: Odessa Fleming M.D.   On: 02/12/2021 11:52   DG Chest Portable 1 View  Result Date: 02/12/2021 CLINICAL DATA:  Fall out of bed this morning.  Weakness. EXAM: PORTABLE CHEST 1 VIEW COMPARISON:  None. FINDINGS: Mild cardiomegaly and ectasia of the thoracic aorta noted. Aortic atherosclerotic calcification noted. Band like opacity in the medial left lung base favors atelectasis or infiltrate. No evidence of pleural effusion. The visualized skeletal structures are unremarkable. IMPRESSION: Atelectasis versus infiltrate in medial left lung base. Electronically Signed   By: Danae Orleans M.D.   On: 02/12/2021 08:43   DG Shoulder Left  Result Date: 02/12/2021 CLINICAL DATA:  Fall this morning. Left shoulder pain. Initial encounter. EXAM: LEFT SHOULDER - 2+ VIEW COMPARISON:  None. FINDINGS: A mildly comminuted and minimally displaced fracture is seen  involving the left humeral neck. No evidence of dislocation. Degenerative spurring is seen along the inferior margin of the humeral head, without significant joint space narrowing. IMPRESSION: Mildly comminuted and minimally displaced left humeral neck fracture. Electronically Signed   By: Danae Orleans M.D.   On: 02/12/2021 08:44   ECHOCARDIOGRAM COMPLETE  Result Date: 02/12/2021    ECHOCARDIOGRAM REPORT   Patient Name:   Brandon Hooper Date of Exam: 02/12/2021 Medical Rec #:  [id number]      Height:       68.0 in Accession #:    [id number]     Weight:       226.0 lb Date of Birth:  December 31, 1945     BSA:          2.153 m Patient Age:    75 years       BP:           154/89 mmHg Patient Gender: M              HR:           74 bpm. Exam Location:  [facility] Procedure: 2D Echo and Strain Analysis Indications:     Stroke I63.9  History:         Patient has no prior history  of Echocardiogram examinations.  Sonographer:     Overton Mam RDCS Referring Phys:  Wynona Neat NIU Diagnosing Phys: Adrian Blackwater  Sonographer Comments: Global longitudinal strain was attempted. IMPRESSIONS  1. Left ventricular ejection fraction, by estimation, is 60 to 65%. The left ventricle has normal function. The left ventricle has no regional wall motion abnormalities. Left ventricular diastolic parameters are consistent with Grade I diastolic dysfunction (impaired relaxation).  2. Right ventricular systolic function is normal. The right ventricular size is normal.  3. The mitral valve is normal in structure. Trivial mitral valve regurgitation. No evidence of mitral stenosis.  4. The aortic valve is normal in structure. Aortic valve regurgitation is not visualized. Mild aortic valve sclerosis is present, with no evidence of aortic valve stenosis.  5. The inferior vena cava is normal in size with greater than 50% respiratory variability, suggesting right atrial pressure of 3 mmHg. FINDINGS  Left Ventricle: Left ventricular ejection fraction, by  estimation, is 60 to 65%. The left ventricle has normal function. The left ventricle has no regional wall motion abnormalities. The left ventricular internal cavity size was normal in size. There is  no left ventricular hypertrophy. Left ventricular diastolic parameters are consistent with Grade I diastolic dysfunction (impaired relaxation). Right Ventricle: The right ventricular size is normal. No increase in right ventricular wall thickness. Right ventricular systolic function is normal. Left Atrium: Left atrial size was normal in size. Right Atrium: Right atrial size was normal in size. Pericardium: There is no evidence of pericardial effusion. Mitral Valve: The mitral valve is normal in structure. Trivial mitral valve regurgitation. No evidence of mitral valve stenosis. Tricuspid Valve: The tricuspid valve is normal in structure. Tricuspid valve regurgitation is trivial. No evidence of tricuspid stenosis. Aortic Valve: The aortic valve is normal in structure. Aortic valve regurgitation is not visualized. Mild aortic valve sclerosis is present, with no evidence of aortic valve stenosis. Aortic valve peak gradient measures 8.8 mmHg. Pulmonic Valve: The pulmonic valve was normal in structure. Pulmonic valve regurgitation is not visualized. No evidence of pulmonic stenosis. Aorta: The aortic root is normal in size and structure. Venous: The inferior vena cava is normal in size with greater than 50% respiratory variability, suggesting right atrial pressure of 3 mmHg. IAS/Shunts: No atrial level shunt detected by color flow Doppler.  LEFT VENTRICLE PLAX 2D LVIDd:         4.37 cm   Diastology LVIDs:         2.87 cm   LV e' medial:    8.70 cm/s LV PW:         1.22 cm   LV E/e' medial:  7.8 LV IVS:        1.13 cm   LV e' lateral:   9.14 cm/s LVOT diam:     2.10 cm   LV E/e' lateral: 7.4 LV SV:         74 LV SV Index:   34 LVOT Area:     3.46 cm  RIGHT VENTRICLE RV Basal diam:  2.29 cm RV S prime:     12.70 cm/s TAPSE  (M-mode): 2.0 cm LEFT ATRIUM             Index        RIGHT ATRIUM           Index LA diam:        3.10 cm 1.44 cm/m   RA Area:     10.10 cm LA Vol (A2C):  51.3 ml 23.83 ml/m  RA Volume:   17.40 ml  8.08 ml/m LA Vol (A4C):   35.2 ml 16.35 ml/m LA Biplane Vol: 42.7 ml 19.84 ml/m  AORTIC VALVE                 PULMONIC VALVE AV Area (Vmax): 2.21 cm     PV Vmax:       0.88 m/s AV Vmax:        148.00 cm/s  PV Peak grad:  3.1 mmHg AV Peak Grad:   8.8 mmHg LVOT Vmax:      94.60 cm/s LVOT Vmean:     60.300 cm/s LVOT VTI:       0.213 m  AORTA Ao Root diam: 3.50 cm Ao Asc diam:  3.40 cm MITRAL VALVE MV Area (PHT): 3.77 cm    SHUNTS MV Decel Time: 201 msec    Systemic VTI:  0.21 m MV E velocity: 67.70 cm/s  Systemic Diam: 2.10 cm MV A velocity: 99.00 cm/s MV E/A ratio:  0.68 Shaukat Khan Electronically signed by Adrian Blackwater Signature Date/Time: 02/12/2021/4:00:53 PM    Final    DG Hip Unilat W or Wo Pelvis 2-3 Views Left  Result Date: 02/12/2021 CLINICAL DATA:  Status post fall. EXAM: DG HIP (WITH OR WITHOUT PELVIS) 2-3V LEFT COMPARISON:  None. FINDINGS: There is no evidence of hip fracture or dislocation. There is no evidence of arthropathy or other focal bone abnormality. IMPRESSION: Negative. Electronically Signed   By: Ted Mcalpine M.D.   On: 02/12/2021 09:21   CT HEAD CODE STROKE WO CONTRAST`  Addendum Date: 02/12/2021   ADDENDUM REPORT: 02/12/2021 07:41 ADDENDUM: Study discussed by telephone with Dr. Antoine Primas on 02/12/2021 at 0840 hours. Electronically Signed   By: Odessa Fleming M.D.   On: 02/12/2021 07:41   Result Date: 02/12/2021 CLINICAL DATA:  Code stroke. 75 year old male with left side weakness last known well 0430 hours. EXAM: CT HEAD WITHOUT CONTRAST TECHNIQUE: Contiguous axial images were obtained from the base of the skull through the vertex without intravenous contrast. COMPARISON:  None. FINDINGS: Brain: Mild motion. Disproportionate volume loss along the pericallosal and  parietooccipital sulci. No midline shift, ventriculomegaly, mass effect, evidence of mass lesion, intracranial hemorrhage or evidence of cortically based acute infarction. Mildly asymmetric white matter hypodensity greater in the left hemisphere, left parietal lobe. Small chronic infarct left lateral cerebellum. Vascular: Calcified atherosclerosis at the skull base. No suspicious intracranial vascular hyperdensity. Skull: No acute osseous abnormality identified. Sinuses/Orbits: Visualized paranasal sinuses and mastoids are clear. Other: No acute orbit or scalp soft tissue finding. ASPECTS [facility] Stroke Program Early CT Score) Total score (0-10 with 10 being normal): 10 IMPRESSION: 1. No acute cortically based infarct or acute intracranial hemorrhage identified. ASPECTS 10. 2. Asymmetric cerebral white matter disease greater in the left hemisphere. Small chronic left cerebellar infarct. Electronically Signed: By: Odessa Fleming M.D. On: 02/12/2021 07:34   CT ANGIO HEAD NECK W WO CM (CODE STROKE)  Result Date: 02/12/2021 CLINICAL DATA:  75 year old male code stroke presentation. Left side weakness. EXAM: CT ANGIOGRAPHY HEAD AND NECK TECHNIQUE: Multidetector CT imaging of the head and neck was performed using the standard protocol during bolus administration of intravenous contrast. Multiplanar CT image reconstructions and MIPs were obtained to evaluate the vascular anatomy. Carotid stenosis measurements (when applicable) are obtained utilizing NASCET criteria, using the distal internal carotid diameter as the denominator. CONTRAST:  10mL OMNIPAQUE IOHEXOL 350 MG/ML SOLN COMPARISON:  Plain head CT 0724  hours today. FINDINGS: CTA NECK Skeleton: Exaggerated thoracic kyphosis and reversal of cervical lordosis. Widespread interbody ankylosis in the lower cervical and upper thoracic spine from bulky flowing endplate osteophytes. No acute osseous abnormality identified. Upper chest: Minor atelectasis, otherwise negative.  Central pulmonary arteries appear to be patent. Other neck: Atrophied bilateral submandibular glands. No acute finding in the neck. Aortic arch: 3 vessel arch configuration with mild calcified arch atherosclerosis. Right carotid system: Negative. Left carotid system: Minimal plaque at the posterior left ICA origin. No stenosis and otherwise negative. Vertebral arteries: Mild calcified plaque at the right subclavian origin without stenosis. Negative right vertebral artery origin and cervical right vertebral artery. Minimal calcified plaque at the left subclavian artery origin without stenosis. Negative left vertebral artery origin and cervical left vertebral artery. CTA HEAD Posterior circulation: Mildly dominant left V4 segment. Patent distal vertebral arteries to the vertebrobasilar junction without plaque or stenosis. Patent PICA origins. Patent basilar artery without stenosis. Patent SCA and PCA origins. Posterior communicating arteries are diminutive or absent. Bilateral PCA branches are within normal limits. Anterior circulation: Both ICA siphons are patent. Mild supraclinoid calcified plaque bilaterally with no significant siphon stenosis. Patent carotid termini. Normal MCA and ACA origins. Anterior communicating artery and bilateral ACA branches are within normal limits. Left MCA M1 segment and trifurcation appear patent without stenosis. Right MCA M1 segment and bifurcation appear patent without stenosis. Bilateral MCA branches are within normal limits. Venous sinuses: Patent. Anatomic variants: Mildly dominant left vertebral artery. Review of the MIP images confirms the above findings IMPRESSION: 1. Negative for large vessel occlusion. 2. Very mild for age atherosclerosis in the head and neck. No significant arterial stenosis. Mild aortic atherosclerosis. 3. 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 Electronically Signed   By: Odessa Fleming M.D.   On: 02/12/2021 09:13    Positive ROS: All other systems have been reviewed and were otherwise negative with the exception of those mentioned in the HPI and as above.  Physical Exam: General: Alert, no acute distress Cardiovascular: No pedal edema Respiratory: No cyanosis, no use of accessory musculature GI: No organomegaly, abdomen is soft and non-tender Skin: No lesions in the area of chief complaint Neurologic: Sensation intact distally Psychiatric: Patient is competent for consent with normal mood and affect Lymphatic: No axillary or cervical lymphadenopathy  MUSCULOSKELETAL: Patient is alert and oriented.  His speech is clear.  The is in a sling.  Passive motion of the left shoulder is fairly smooth and minimally painful.  There is no bruising or swelling.  Neurovascular status is good in both upper extremities.  Movement of both lower extremities is normal without pain.  Back is nontender.  Assessment: Probable hairline fracture proximal left humerus  Plan: Patient should remain in a sling.   He should return to my office at the end of the week for reevaluation.   Ice and pain medication as needed would be recommended.    Valinda Hoar, MD [phone]   02/12/2021 7:05 PM

## 2021-02-12 NOTE — ED Notes (Signed)
Patient transported to CT 

## 2021-02-12 NOTE — Consult Note (Signed)
[facility] TeleNeurology Consult Services   Patient Name:   Brandon Hooper, Brandon Hooper Date of Birth:   01-22-1946 Identification Number:   MRN - [id number] Date of Service:   02/12/2021 07:40:42  Diagnosis:       R53.1 - Weakness  Impression:      Pt with a h/o HTN awoke and went to the bathroom where he felt generally weak and fell. He injured his left shoulder and hip region. He is having left arm pain and weakness. Ddx includes trauma/shoulder injury vs stroke with minimal deficits. Left arm drift is due to pain per pt and grip is equal. Discussed thrombolytics however given minimal deficit not given.  Recommend admission, MRI brain w/o, ASA 81 mg.  Metrics: Last Known Well: 02/12/2021 04:30:00 [facility] Notification Time: 02/12/2021 07:40:42 Arrival Time: 02/12/2021 07:19:00 Stamp Time: 02/12/2021 07:40:42 Initial Response Time: 02/12/2021 07:42:13 Symptoms: fall, left arm weakness. NIHSS Start Assessment Time: 02/12/2021 07:51:22 Patient is not a candidate for Thrombolytic. Thrombolytic Medical Decision: 02/12/2021 07:58:00 Patient was not deemed candidate for Thrombolytic because of following reasons: No disabling symptoms.  CT head showed no acute hemorrhage or acute core infarct. CT head was reviewed and results were: I personally Reviewed the CT Head and it [facility]  ED Physician notified of diagnostic impression and management plan on 02/12/2021 08:15:00  Advanced Imaging: CTA Head and Neck Completed.  LVO:No  Patient doesn't meet criteria for emergent NIR consideration   Our recommendations are outlined below.  Recommendations:        Stroke/Telemetry Floor       Neuro Checks       Bedside Swallow Eval       DVT Prophylaxis       IV Fluids, Normal Saline       Head of Bed 30 Degrees       Euglycemia and Avoid Hyperthermia (PRN Acetaminophen)       Initiate or continue Aspirin 81 MG daily       Antihypertensives PRN if Blood pressure is  greater than 220/120 or there is a concern for End organ damage/contraindications for permissive HTN. If blood pressure is greater than 220/120 give labetalol PO or IV or Vasotec IV with a goal of 15% reduction in BP during the first 24 hours.  Routine Consultation with [facility] for Follow up Care  Sign Out:       Discussed with Emergency Department Provider    ------------------------------------------------------------------------------  History of Present Illness: Patient is a 75 year old Male.  Patient was brought by EMS for symptoms of fall, left arm weakness.  The patient has a h/o HTN awoke at 0430 and when he went to the bathroom he fell. He states that he felt normal walking to the bathroom for a few seconds and then heard a buzzing sound in his ear, felt generally weak and fell. He hit his left side, his left shoulder is painful. He then was noted to have left sided arm weakness, possible left facial weakness and slurred speech on arrival.   Past Medical History:      Hypertension  Anticoagulant use:  No  Antiplatelet use: No  Allergies:  Reviewed    Examination: BP(152/64), Pulse(79), Blood Glucose(172) 1A: Level of Consciousness - Alert; keenly responsive + 0 1B: Ask Month and Age - Both Questions Right + 0 1C: Blink Eyes & Squeeze Hands - Performs Both Tasks + 0 2: Test Horizontal Extraocular Movements - Normal + 0 3: Test Visual Fields - No Visual  Loss + 0 4: Test Facial Palsy (Use Grimace if Obtunded) - Normal symmetry + 0 5A: Test Left Arm Motor Drift - Drift, hits bed + 2 5B: Test Right Arm Motor Drift - No Drift for 10 Seconds + 0 6A: Test Left Leg Motor Drift - No Drift for 5 Seconds + 0 6B: Test Right Leg Motor Drift - No Drift for 5 Seconds + 0 7: Test Limb Ataxia (FNF/Heel-Shin) - No Ataxia + 0 8: Test Sensation - Normal; No sensory loss + 0 9: Test Language/Aphasia - Normal; No aphasia + 0 10: Test Dysarthria - Mild-Moderate Dysarthria:  Slurring but can be understood + 1 11: Test Extinction/Inattention - No abnormality + 0  NIHSS Score: 3  NIHSS Free Text : left arm pain limits exam, grip equal per nursing  Pre-Morbid Modified Rankin Scale: 0 Points = No symptoms at all   Patient/Family was informed the Neurology Consult would occur via TeleHealth consult by way of interactive audio and video telecommunications and consented to receiving care in this manner.   Patient is being evaluated for possible acute neurologic impairment and high probability of imminent or life-threatening deterioration. I spent total of 35 minutes providing care to this patient, including time for face to face visit via telemedicine, review of medical records, imaging studies and discussion of findings with providers, the patient and/or family.   Dr Rutherford Guys   [facility] [phone]  Case [id number]

## 2021-02-12 NOTE — ED Notes (Signed)
L shoulder sling applied

## 2021-02-12 NOTE — Consult Note (Signed)
NEURO HOSPITALIST CONSULT NOTE   Requesting physician: Dr. Clyde Lundborg  Reason for Consult: Acute left cerebellar stroke  History obtained from:   Patient and Chart     HPI:                                                                                                                                          Brandon Hooper is an 75 y.o. male with a PMHx of HTN, arthritis and chronic lower extremity lymphedema who presented to the ED via EMS this morning as a Code Stroke for assessment of left sided weakness, slurred speech and left sided facial droop. LKN was 0430 which was the same as the time of symptom onset when he fell out of bed. He reported falling because he felt weak, but he was able to walk back to bed. He was complaining of dizziness and ringing in his right ear at that time. His wife then called EMS at 0630 when she noted left sided weakness, slurred speech, and left sided facial droop. He did not strike his head when he fell. He was nauseated with vomiting on arrival. He could not raise his left arm due to pain; was later revealed by x-ray imaging to have a humerus fracture from the fall. BP with EMS 138/63, CBG 206, HR 87, SpO2 97% on room air.   He has had no other recent falls or injuries, rash, abdominal pain, fever, chills, cough, diarrhea, chest pain, or abdominal pain.   CT head showed no acute hemorrhage or acute core infarct. Teleneurology was then consulted.   Impression from Teleneurology consult note has been reviewed: "Pt with a h/o HTN awoke and went to the bathroom where he felt generally weak and fell. He injured his left shoulder and hip region. He is having left arm pain and weakness. Ddx includes trauma/shoulder injury vs stroke with minimal deficits. Left arm drift is due to pain per pt and grip is equal. Discussed thrombolytics however given minimal deficit not given.  Recommend admission, MRI brain w/o, ASA 81 mg."  He has no prior history of stroke  or MI.   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   Past Medical History:  Diagnosis Date   Arthritis    Bone loss    Hypertension     Past Surgical History:  Procedure Laterality Date   EYE SURGERY Bilateral    Feb 2006 and then in Jan 2009    Family History  Problem Relation Age of Onset   Diabetes Father             Social History:  reports that he has never smoked. He has never used smokeless tobacco. He reports that he does  not currently use alcohol. No history on file for drug use.  Allergies  Allergen Reactions   Erythromycin Hives   Ofloxacin     Other reaction(s): Other (See Comments) "Arthritic pain". The patient reported 5 years of left hip pain when he last took ofloxacin. Likely could try another fluoroquinolone.    Cephalexin Rash    The patient previously tolerated cephalexin in 2013 and reported in 2019 that he developed redness and itching of his knees while on cephalexin. Not entirely clear it was a true allergic reaction.    Clindamycin Hcl Rash   Doxycycline Calcium Rash   Sulfamethoxazole-Trimethoprim Rash   Tetracycline Rash    MEDICATIONS:                                                                                                                      No current facility-administered medications on file prior to encounter.   Current Outpatient Medications on File Prior to Encounter  Medication Sig Dispense Refill   lisinopril (ZESTRIL) 10 MG tablet Take 10 mg by mouth daily. (Patient not taking: No sig reported)       ROS:                                                                                                                                       As per HPI. Does not endorse any additional symptoms.    Blood pressure (!) 154/89, pulse 77, temperature 97.6 F (36.4 C), temperature source Oral, resp. rate 10, SpO2 98 %.   General Examination:                                                                                                        Physical Exam  HEENT-  [place]/AT    Lungs- Respirations unlabored  Extremities- Warm and well perfused. LUE in a sling due to humerus fracture from fall today.    Neurological Examination Mental Status: Awake, alert and oriented. Speech fluent with intact  comprehension and naming. No dysarthria. Decreased prosodic content to speech is noted, as well as somewhat flattened affect. Mildly tangential when responding to questions and has to be redirected at times.    Cranial Nerves: II: Temporal visual fields intact with no extinction to DSS. PERRL.   III,IV, VI: No ptosis. EOMI with saccadic quality of pursuits noted. No nystagmus.  V: Temp sensation equal bilaterally VII: Smile symmetric.  VIII: Hearing intact to conversation IX,X: No hoarseness or hypophonia XI: Symmetric XII: Midline tongue extension Motor: RUE 5/5 proximally and distally.  LUE tested only grip strength (5/5) due to splint RLE 5/5 LLE 5/5 Sensory: Temp and light touch sensation intact throughout, bilaterally. No extinction to DSS.  Deep Tendon Reflexes: 2+ and symmetric biceps, brachioradialis and patellae Cerebellar: No ataxia with FNF on the right. Unable to assess on the left. No ataxia with H-S bilaterally  Gait: Deferred due to falls risk concerns.    Lab Results: Basic Metabolic Panel: Recent Labs  Lab 02/12/21 0754 02/12/21 0830  NA 134*  --   K 3.4*  --   CL 102  --   CO2 23  --   GLUCOSE 185*  --   BUN 15  --   CREATININE 0.79  --   CALCIUM 8.8*  --   MG  --  2.1    CBC: Recent Labs  Lab 02/12/21 0754  WBC 18.1*  NEUTROABS 14.9*  HGB 13.8  HCT 41.1  MCV 93.0  PLT 242    Cardiac Enzymes: No results for input(s): CKTOTAL, CKMB, CKMBINDEX, TROPONINI in the last 168 hours.  Lipid Panel: No results for input(s): CHOL, TRIG, HDL, CHOLHDL, VLDL, LDLCALC in the last 168 hours.  Imaging: DG Lumbar Spine 2-3 Views  Result Date:  02/12/2021 CLINICAL DATA:  Fall out of bed last night, weakness, pain EXAM: LUMBAR SPINE - 2-3 VIEW COMPARISON:  None. FINDINGS: This report assumes 5 non rib-bearing lumbar vertebrae. Lumbar vertebral body heights are preserved, with no fracture. Mild multilevel lumbar degenerative disc disease. No spondylolisthesis. No appreciable facet arthropathy. No aggressive appearing focal osseous lesions. Excreted contrast is seen in the renal collecting systems bilaterally. IMPRESSION: No lumbar spine fracture or spondylolisthesis. Mild multilevel lumbar degenerative disc disease. Electronically Signed   By: Delbert Phenix M.D.   On: 02/12/2021 09:24   MR BRAIN WO CONTRAST  Result Date: 02/12/2021 CLINICAL DATA:  75 year old male code stroke presentation. Left side symptoms. EXAM: MRI HEAD WITHOUT CONTRAST TECHNIQUE: Multiplanar, multiecho pulse sequences of the brain and surrounding structures were obtained without intravenous contrast. COMPARISON:  Head CT and CTA earlier today. 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 Technologist was unable to use dedicated head coil from brain imaging. Patchy roughly 2 cm area of restricted diffusion in the left superior cerebellar artery territory. See series 15, image 16 and series 13, image 21. Faint T2 and FLAIR hyperintensity in that area. No hemorrhage or mass effect. Nearby small chronic posterior left cerebellar infarct as seen by CT. No other restricted diffusion. No midline shift, mass effect, evidence of mass lesion, ventriculomegaly, extra-axial collection or acute intracranial hemorrhage is evident. Cervicomedullary junction and pituitary are within normal limits. Mild for age patchy bilateral cerebral white matter T2 and FLAIR hyperintensity. Negative deep gray matter nuclei and brainstem. Vascular: Major intracranial vascular flow voids are preserved. Skull and upper cervical spine: Visualized bone marrow signal is within normal limits.  Sinuses/Orbits: Postoperative changes to both globes, otherwise negative. Other: Mastoids are clear.  Negative visible scalp and face. IMPRESSION: 1. 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 No associated hemorrhage or mass effect. 2. Mildly limited as unable to use dedicated head coil for brain imaging due to patient kyphosis. 3. No other acute intracranial abnormality. Electronically Signed   By: Odessa Fleming M.D.   On: 02/12/2021 11:52   DG Chest Portable 1 View  Result Date: 02/12/2021 CLINICAL DATA:  Fall out of bed this morning.  Weakness. EXAM: PORTABLE CHEST 1 VIEW COMPARISON:  None. FINDINGS: Mild cardiomegaly and ectasia of the thoracic aorta noted. Aortic atherosclerotic calcification noted. Band like opacity in the medial left lung base favors atelectasis or infiltrate. No evidence of pleural effusion. The visualized skeletal structures are unremarkable. IMPRESSION: Atelectasis versus infiltrate in medial left lung base. Electronically Signed   By: Danae Orleans M.D.   On: 02/12/2021 08:43   DG Shoulder Left  Result Date: 02/12/2021 CLINICAL DATA:  Fall this morning. Left shoulder pain. Initial encounter. EXAM: LEFT SHOULDER - 2+ VIEW COMPARISON:  None. FINDINGS: A mildly comminuted and minimally displaced fracture is seen involving the left humeral neck. No evidence of dislocation. Degenerative spurring is seen along the inferior margin of the humeral head, without significant joint space narrowing. IMPRESSION: Mildly comminuted and minimally displaced left humeral neck fracture. Electronically Signed   By: Danae Orleans M.D.   On: 02/12/2021 08:44   DG Hip Unilat W or Wo Pelvis 2-3 Views Left  Result Date: 02/12/2021 CLINICAL DATA:  Status post fall. EXAM: DG HIP (WITH OR WITHOUT PELVIS) 2-3V LEFT COMPARISON:  None. FINDINGS: There is no evidence of hip fracture or dislocation. There is no evidence of arthropathy or other focal bone abnormality.  IMPRESSION: Negative. Electronically Signed   By: Ted Mcalpine M.D.   On: 02/12/2021 09:21   CT HEAD CODE STROKE WO CONTRAST`  Addendum Date: 02/12/2021   ADDENDUM REPORT: 02/12/2021 07:41 ADDENDUM: Study discussed by telephone with Dr. Antoine Primas on 02/12/2021 at 0840 hours. Electronically Signed   By: Odessa Fleming M.D.   On: 02/12/2021 07:41   Result Date: 02/12/2021 CLINICAL DATA:  Code stroke. 75 year old male with left side weakness last known well 0430 hours. EXAM: CT HEAD WITHOUT CONTRAST TECHNIQUE: Contiguous axial images were obtained from the base of the skull through the vertex without intravenous contrast. COMPARISON:  None. FINDINGS: Brain: Mild motion. Disproportionate volume loss along the pericallosal and parietooccipital sulci. No midline shift, ventriculomegaly, mass effect, evidence of mass lesion, intracranial hemorrhage or evidence of cortically based acute infarction. Mildly asymmetric white matter hypodensity greater in the left hemisphere, left parietal lobe. Small chronic infarct left lateral cerebellum. Vascular: Calcified atherosclerosis at the skull base. No suspicious intracranial vascular hyperdensity. Skull: No acute osseous abnormality identified. Sinuses/Orbits: Visualized paranasal sinuses and mastoids are clear. Other: No acute orbit or scalp soft tissue finding. ASPECTS [facility] Stroke Program Early CT Score) Total score (0-10 with 10 being normal): 10 IMPRESSION: 1. No acute cortically based infarct or acute intracranial hemorrhage identified. ASPECTS 10. 2. Asymmetric cerebral white matter disease greater in the left hemisphere. Small chronic left cerebellar infarct. Electronically Signed: By: Odessa Fleming M.D. On: 02/12/2021 07:34   CT ANGIO HEAD NECK W WO CM (CODE STROKE)  Result Date: 02/12/2021 CLINICAL DATA:  75 year old male code stroke presentation. Left side weakness. EXAM: CT ANGIOGRAPHY HEAD AND NECK TECHNIQUE: Multidetector CT imaging of the head and neck was  performed using the standard protocol during bolus administration  of intravenous contrast. Multiplanar CT image reconstructions and MIPs were obtained to evaluate the vascular anatomy. Carotid stenosis measurements (when applicable) are obtained utilizing NASCET criteria, using the distal internal carotid diameter as the denominator. CONTRAST:  16mL OMNIPAQUE IOHEXOL 350 MG/ML SOLN COMPARISON:  Plain head CT 0724 hours today. FINDINGS: CTA NECK Skeleton: Exaggerated thoracic kyphosis and reversal of cervical lordosis. Widespread interbody ankylosis in the lower cervical and upper thoracic spine from bulky flowing endplate osteophytes. No acute osseous abnormality identified. Upper chest: Minor atelectasis, otherwise negative. Central pulmonary arteries appear to be patent. Other neck: Atrophied bilateral submandibular glands. No acute finding in the neck. Aortic arch: 3 vessel arch configuration with mild calcified arch atherosclerosis. Right carotid system: Negative. Left carotid system: Minimal plaque at the posterior left ICA origin. No stenosis and otherwise negative. Vertebral arteries: Mild calcified plaque at the right subclavian origin without stenosis. Negative right vertebral artery origin and cervical right vertebral artery. Minimal calcified plaque at the left subclavian artery origin without stenosis. Negative left vertebral artery origin and cervical left vertebral artery. CTA HEAD Posterior circulation: Mildly dominant left V4 segment. Patent distal vertebral arteries to the vertebrobasilar junction without plaque or stenosis. Patent PICA origins. Patent basilar artery without stenosis. Patent SCA and PCA origins. Posterior communicating arteries are diminutive or absent. Bilateral PCA branches are within normal limits. Anterior circulation: Both ICA siphons are patent. Mild supraclinoid calcified plaque bilaterally with no significant siphon stenosis. Patent carotid termini. Normal MCA and ACA  origins. Anterior communicating artery and bilateral ACA branches are within normal limits. Left MCA M1 segment and trifurcation appear patent without stenosis. Right MCA M1 segment and bifurcation appear patent without stenosis. Bilateral MCA branches are within normal limits. Venous sinuses: Patent. Anatomic variants: Mildly dominant left vertebral artery. Review of the MIP images confirms the above findings IMPRESSION: 1. Negative for large vessel occlusion. 2. Very mild for age atherosclerosis in the head and neck. No significant arterial stenosis. Mild aortic atherosclerosis. 3. 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 Electronically Signed   By: Odessa Fleming M.D.   On: 02/12/2021 09:13    TTE:  1. Left ventricular ejection fraction, by estimation, is 60 to 65%. The  left ventricle has normal function. The left ventricle has no regional  wall motion abnormalities. Left ventricular diastolic parameters are  consistent with Grade I diastolic  dysfunction (impaired relaxation).   2. Right ventricular systolic function is normal. The right ventricular  size is normal.   3. The mitral valve is normal in structure. Trivial mitral valve  regurgitation. No evidence of mitral stenosis.   4. The aortic valve is normal in structure. Aortic valve regurgitation is  not visualized. Mild aortic valve sclerosis is present, with no evidence  of aortic valve stenosis.   5. The inferior vena cava is normal in size with greater than 50%  respiratory variability, suggesting right atrial pressure of 3 mmHg.   Assessment: 75 year old male with acute left cerebellar ischemic infarction 1. Exam reveals no definite focal deficit. Unable to test coordination in LUE due to humerus fracture.  2. 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 No associated  hemorrhage or mass effect.   3. CTA of head and neck: Negative for large vessel occlusion. Very mild for age atherosclerosis in the head and neck. No significant arterial stenosis. Mild aortic atherosclerosis. 4. X-ray  of left shoulder:  Mildly comminuted and minimally displaced left humeral neck fracture 5. No mural thrombus or valvular vegetation mentioned on TTE report. LVEF is 60 to 65% with no regional wall motion abnormalities.   Recommendations: 1. HgbA1c, fasting lipid panel 2. Atorvastatin 40 mg po qd. Obtain baseline CK level.  3. PT consult, OT consult, Speech consult 4. Start ASA 81 mg po qd 5. Permissive HTN until 4:30 AM Monday morning.  6. Telemetry monitoring 7. Frequent neuro checks 8. Outpatient Neurology follow up. 9. Other than observing cardiac telemetry for possible paroxysmal atrial fibrillation, stroke work up is completed. Neurohospitalist service will sign off. Please call if there are additional questions.     Electronically signed: Dr. Caryl Pina 02/12/2021, 1:08 PM

## 2021-02-12 NOTE — ED Provider Notes (Signed)
[facility] Emergency Department Provider Note  ____________________________________________   Event Date/Time   First MD Initiated Contact with Patient 02/12/21 0719     (approximate)  I have reviewed the triage vital signs and the nursing notes.   HISTORY  Chief Complaint Code Stroke   HPI Brandon Hooper is a 75 y.o. male with past medical history of hypertension and arthritis as well as any chronic lower extremity lymphedema who presents via EMS for assessment of some weakness in the left upper extremity and pain in the left arm and concern for left-sided facial droop and slurred speech after a fall that occurred earlier this morning.  Patient states he got out of bed around 430 and then fell because he was feeling weak.  His wife helped him get back to bed and called EMS.  Patient states that he does not know why he fell but did not hit his head or lose consciousness.  He denies being on any blood thinners.  He states he has no addition to pain in his left shoulder some pain in his neck and a little soreness in the left hip.  No other recent falls or injuries, fevers, chills, cough, diarrhea, chest pain, abdominal pain, fevers rash or other sick symptoms other than a little nausea and vomiting he developed in route with EMS.  He has never had a stroke before.         Past Medical History:  Diagnosis Date   Arthritis    Bone loss    Hypertension     Patient Active Problem List   Diagnosis Date Noted   Lymphedema 05/20/2018   Venous ulcer of ankle, left (HCC) 02/11/2018   Venous insufficiency 12/24/2017   Essential hypertension 12/24/2017   Umbilical hernia without obstruction and without gangrene 12/24/2017    Past Surgical History:  Procedure Laterality Date   EYE SURGERY Bilateral    Feb 2006 and then in Jan 2009    Prior to Admission medications   Not on File    Allergies Erythromycin, Ofloxacin, Cephalexin, Clindamycin hcl,  Doxycycline calcium, Sulfamethoxazole-trimethoprim, and Tetracycline  Family History  Problem Relation Age of Onset   Diabetes Father     Social History Social History   Tobacco Use   Smoking status: Never   Smokeless tobacco: Never  Substance Use Topics   Alcohol use: Not Currently    Review of Systems  Review of Systems  Constitutional:  Negative for chills and fever.  HENT:  Negative for sore throat.   Eyes:  Negative for pain.  Respiratory:  Negative for cough and stridor.   Cardiovascular:  Negative for chest pain.  Gastrointestinal:  Positive for nausea and vomiting.  Musculoskeletal:  Positive for joint pain (L shoulder, L hip).  Skin:  Negative for rash.  Neurological:  Positive for speech change and weakness (LUE). Negative for seizures, loss of consciousness and headaches.  Psychiatric/Behavioral:  Negative for suicidal ideas.   All other systems reviewed and are negative.    ____________________________________________   PHYSICAL EXAM:  VITAL SIGNS: ED Triage Vitals  Enc Vitals Group     BP      Pulse      Resp      Temp      Temp src      SpO2      Weight      Height      Head Circumference      Peak Flow  Pain Score      Pain Loc      Pain Edu?      Excl. in GC?    Vitals:   02/12/21 0748 02/12/21 0946  BP: (!) 152/64 139/76  Pulse: 85 78  Resp: 17 15  Temp: 97.6 F (36.4 C)   SpO2: 96% 99%   Physical Exam Vitals and nursing note reviewed.  Constitutional:      Appearance: He is well-developed. He is ill-appearing.  HENT:     Head: Normocephalic and atraumatic.     Right Ear: External ear normal.     Left Ear: External ear normal.     Nose: Nose normal.  Eyes:     Conjunctiva/sclera: Conjunctivae normal.  Cardiovascular:     Rate and Rhythm: Normal rate and regular rhythm.     Heart sounds: No murmur heard. Pulmonary:     Effort: Pulmonary effort is normal. No respiratory distress.     Breath sounds: Normal breath  sounds.  Abdominal:     Palpations: Abdomen is soft.     Tenderness: There is no abdominal tenderness.  Musculoskeletal:     Cervical back: Neck supple.  Skin:    General: Skin is warm and dry.  Neurological:     Mental Status: He is alert and oriented to person, place, and time.  Psychiatric:        Mood and Affect: Mood normal.    Cranial nerves II through XII appear grossly intact.  Patient is unable to participate in finger-nose testing or pronator drift testing in the left upper extremity secondary to weakness and pain in left shoulder.  He has no pronator drift or finger dysmetria in the right upper extremity.  He has full strength of the right upper extremity and bilateral lower extremities.  He is very limited strength in the left shoulder and distally although no areas no gross deformities or joint effusions.  Patient does have some tenderness circumferentially about the left shoulder but no significant tenderness about the left elbow or wrist or throughout the right shoulder, elbow, wrist or bilateral knees or ankles.  There are some mild soreness over the left hip and some ecchymosis in the lower left quadrant of the abdomen left leg extending down to the superior left lateral hip.  No other obvious trauma to the abdomen or chest.  There is some mild tenderness over the C and L-spine but none of the T-spine.  2+ radial and DP pulses. ____________________________________________   LABS (all labs ordered are listed, but only abnormal results are displayed)  Labs Reviewed  CBC - Abnormal; Notable for the following components:      Result Value   WBC 18.1 (*)    All other components within normal limits  DIFFERENTIAL - Abnormal; Notable for the following components:   Neutro Abs 14.9 (*)    Monocytes Absolute 1.4 (*)    Abs Immature Granulocytes 0.12 (*)    All other components within normal limits  COMPREHENSIVE METABOLIC PANEL - Abnormal; Notable for the following components:    Sodium 134 (*)    Potassium 3.4 (*)    Glucose, Bld 185 (*)    Calcium 8.8 (*)    Total Bilirubin 1.3 (*)    All other components within normal limits  CBG MONITORING, ED - Abnormal; Notable for the following components:   Glucose-Capillary 172 (*)    All other components within normal limits  RESP PANEL BY RT-PCR (FLU A&B, COVID) ARPGX2  ETHANOL  PROTIME-INR  APTT  PROCALCITONIN  URINE DRUG SCREEN, QUALITATIVE ([facility] ONLY)  URINALYSIS, ROUTINE W REFLEX MICROSCOPIC  URINALYSIS, COMPLETE (UACMP) WITH MICROSCOPIC  HEMOGLOBIN A1C  TROPONIN I (HIGH SENSITIVITY)   ____________________________________________  EKG  ECG remarkable for sinus rhythm with a ventricular rate of 82, normal axis, nonspecific changes versus artifact in lead I, lead III without other clear evidence of acute ischemia or significant arrhythmia. ____________________________________________  RADIOLOGY  ED MD interpretation: CT head without contrast shows no evidence of skull fracture, intracranial hemorrhage or acute ischemia.  There is asymmetry of the white matter disease which is greater in the left hemisphere compared to the right and a small chronic left cerebellar infarct.  CTA head and neck shows no large vessel occlusion, dissection or other acute process but does show some mild atherosclerosis.  Plain film of the left shoulder shows a proximal left humeral neck fracture but otherwise plain films of the chest and pelvis shows no evidence of pneumonia, pneumothorax, rib fracture, effusion or acute pelvic fracture dislocation.  Plain film L-spine and left hip shows no acute fracture or dislocation.  Official radiology report(s): DG Lumbar Spine 2-3 Views  Result Date: 02/12/2021 CLINICAL DATA:  Fall out of bed last night, weakness, pain EXAM: LUMBAR SPINE - 2-3 VIEW COMPARISON:  None. FINDINGS: This report assumes 5 non rib-bearing lumbar vertebrae. Lumbar vertebral body heights are preserved, with no  fracture. Mild multilevel lumbar degenerative disc disease. No spondylolisthesis. No appreciable facet arthropathy. No aggressive appearing focal osseous lesions. Excreted contrast is seen in the renal collecting systems bilaterally. IMPRESSION: No lumbar spine fracture or spondylolisthesis. Mild multilevel lumbar degenerative disc disease. Electronically Signed   By: Delbert Phenix M.D.   On: 02/12/2021 09:24   DG Chest Portable 1 View  Result Date: 02/12/2021 CLINICAL DATA:  Fall out of bed this morning.  Weakness. EXAM: PORTABLE CHEST 1 VIEW COMPARISON:  None. FINDINGS: Mild cardiomegaly and ectasia of the thoracic aorta noted. Aortic atherosclerotic calcification noted. Band like opacity in the medial left lung base favors atelectasis or infiltrate. No evidence of pleural effusion. The visualized skeletal structures are unremarkable. IMPRESSION: Atelectasis versus infiltrate in medial left lung base. Electronically Signed   By: Danae Orleans M.D.   On: 02/12/2021 08:43   DG Shoulder Left  Result Date: 02/12/2021 CLINICAL DATA:  Fall this morning. Left shoulder pain. Initial encounter. EXAM: LEFT SHOULDER - 2+ VIEW COMPARISON:  None. FINDINGS: A mildly comminuted and minimally displaced fracture is seen involving the left humeral neck. No evidence of dislocation. Degenerative spurring is seen along the inferior margin of the humeral head, without significant joint space narrowing. IMPRESSION: Mildly comminuted and minimally displaced left humeral neck fracture. Electronically Signed   By: Danae Orleans M.D.   On: 02/12/2021 08:44   DG Hip Unilat W or Wo Pelvis 2-3 Views Left  Result Date: 02/12/2021 CLINICAL DATA:  Status post fall. EXAM: DG HIP (WITH OR WITHOUT PELVIS) 2-3V LEFT COMPARISON:  None. FINDINGS: There is no evidence of hip fracture or dislocation. There is no evidence of arthropathy or other focal bone abnormality. IMPRESSION: Negative. Electronically Signed   By: Ted Mcalpine M.D.    On: 02/12/2021 09:21   CT HEAD CODE STROKE WO CONTRAST`  Addendum Date: 02/12/2021   ADDENDUM REPORT: 02/12/2021 07:41 ADDENDUM: Study discussed by telephone with Dr. Antoine Primas on 02/12/2021 at 0840 hours. Electronically Signed   By: Odessa Fleming M.D.   On: 02/12/2021 07:41   Result  Date: 02/12/2021 CLINICAL DATA:  Code stroke. 75 year old male with left side weakness last known well 0430 hours. EXAM: CT HEAD WITHOUT CONTRAST TECHNIQUE: Contiguous axial images were obtained from the base of the skull through the vertex without intravenous contrast. COMPARISON:  None. FINDINGS: Brain: Mild motion. Disproportionate volume loss along the pericallosal and parietooccipital sulci. No midline shift, ventriculomegaly, mass effect, evidence of mass lesion, intracranial hemorrhage or evidence of cortically based acute infarction. Mildly asymmetric white matter hypodensity greater in the left hemisphere, left parietal lobe. Small chronic infarct left lateral cerebellum. Vascular: Calcified atherosclerosis at the skull base. No suspicious intracranial vascular hyperdensity. Skull: No acute osseous abnormality identified. Sinuses/Orbits: Visualized paranasal sinuses and mastoids are clear. Other: No acute orbit or scalp soft tissue finding. ASPECTS [facility] Stroke Program Early CT Score) Total score (0-10 with 10 being normal): 10 IMPRESSION: 1. No acute cortically based infarct or acute intracranial hemorrhage identified. ASPECTS 10. 2. Asymmetric cerebral white matter disease greater in the left hemisphere. Small chronic left cerebellar infarct. Electronically Signed: By: Odessa Fleming M.D. On: 02/12/2021 07:34   CT ANGIO HEAD NECK W WO CM (CODE STROKE)  Result Date: 02/12/2021 CLINICAL DATA:  75 year old male code stroke presentation. Left side weakness. EXAM: CT ANGIOGRAPHY HEAD AND NECK TECHNIQUE: Multidetector CT imaging of the head and neck was performed using the standard protocol during bolus administration of  intravenous contrast. Multiplanar CT image reconstructions and MIPs were obtained to evaluate the vascular anatomy. Carotid stenosis measurements (when applicable) are obtained utilizing NASCET criteria, using the distal internal carotid diameter as the denominator. CONTRAST:  79mL OMNIPAQUE IOHEXOL 350 MG/ML SOLN COMPARISON:  Plain head CT 0724 hours today. FINDINGS: CTA NECK Skeleton: Exaggerated thoracic kyphosis and reversal of cervical lordosis. Widespread interbody ankylosis in the lower cervical and upper thoracic spine from bulky flowing endplate osteophytes. No acute osseous abnormality identified. Upper chest: Minor atelectasis, otherwise negative. Central pulmonary arteries appear to be patent. Other neck: Atrophied bilateral submandibular glands. No acute finding in the neck. Aortic arch: 3 vessel arch configuration with mild calcified arch atherosclerosis. Right carotid system: Negative. Left carotid system: Minimal plaque at the posterior left ICA origin. No stenosis and otherwise negative. Vertebral arteries: Mild calcified plaque at the right subclavian origin without stenosis. Negative right vertebral artery origin and cervical right vertebral artery. Minimal calcified plaque at the left subclavian artery origin without stenosis. Negative left vertebral artery origin and cervical left vertebral artery. CTA HEAD Posterior circulation: Mildly dominant left V4 segment. Patent distal vertebral arteries to the vertebrobasilar junction without plaque or stenosis. Patent PICA origins. Patent basilar artery without stenosis. Patent SCA and PCA origins. Posterior communicating arteries are diminutive or absent. Bilateral PCA branches are within normal limits. Anterior circulation: Both ICA siphons are patent. Mild supraclinoid calcified plaque bilaterally with no significant siphon stenosis. Patent carotid termini. Normal MCA and ACA origins. Anterior communicating artery and bilateral ACA branches are  within normal limits. Left MCA M1 segment and trifurcation appear patent without stenosis. Right MCA M1 segment and bifurcation appear patent without stenosis. Bilateral MCA branches are within normal limits. Venous sinuses: Patent. Anatomic variants: Mildly dominant left vertebral artery. Review of the MIP images confirms the above findings IMPRESSION: 1. Negative for large vessel occlusion. 2. Very mild for age atherosclerosis in the head and neck. No significant arterial stenosis. Mild aortic atherosclerosis. 3. 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 Electronically Signed  By: Odessa Fleming M.D.   On: 02/12/2021 09:13    ____________________________________________   PROCEDURES  Procedure(s) performed (including Critical Care):  Procedures   ____________________________________________   INITIAL IMPRESSION / ASSESSMENT AND PLAN / ED COURSE     Patient presents with above-stated history exam medical history activated by EMS prior to arrival.  On arrival patient slightly hypertensive with otherwise stable vital signs on room air.  Patient is currently complaining of pain in his left shoulder and has had induration and some bruising over the left hip and left flank.  Otherwise no significant facial droop slurred speech or other focal deficits appreciated.  He states he heard a buzzing sound and felt weak before falling but does not describe a specific trip I am not clear at this time on arrival if patient had a syncopal episode or possible CVA but I have a lower suspicion for seizure at this time.  Differential for possible syncope includes arrhythmia, PE, anemia, orthostasis patient reports he was standing up, infectious process, metabolic derangements and polypharmacy.  With regard to stroke work-up with CT head shows an old stroke and some asymmetric chronic changes without  evidence of hemorrhage or clear large ischemia.  I discussed with on-call neurologist Dr. Burnis Medin who recommended adding a CTA head and neck and if this was negative still admitting for MRI.  CTA head and neck shows no large vessel occlusion, dissection or other acute process but does show some mild atherosclerosis.  With regard to pain in left shoulder and report of fall onto the left shoulder and possible syncope I did obtain a plain film of the left shoulder as well as a chest and pelvis x-rays.  Plain film of the left shoulder shows a proximal left humeral neck fracture but otherwise plain films of the chest and pelvis shows no evidence of pneumonia, pneumothorax, rib fracture, effusion or acute pelvic fracture dislocation.  EKG with some nonspecific changes but no significant arrhythmia or clear ischemia and given nonelevated troponin of 7 with patient denying any chest pain I have a low suspicion for occlusion MI at this time.  CMP remarkable for K of 3.4 and a glucose of 185 without any other significant derangements.  Serum ethanol is undetectable and overall have a low suspicion for toxic ingestion at this time.  INR is 1.  Procalcitonin is undetectable and I have low suspicion for infectious process despite some atelectasis seen on chest x-ray that radiology notes that cannot rule out infectious cause 4.  Wife at bedside states that patient's speech still seems not quite back to baseline.  I will admit to medicine service for further evaluation management.  Also discussed patient with on-call orthopedist Dr. Hyacinth Meeker who recommends sling for patient and pain control for the left humeral neck fracture.  He will see patient either later his afternoon or early tomorrow morning.  Patient is otherwise neurovascularly intact in the extremity I have low suspicion for compartment syndrome.  I suspect he also has a contusion and some superficial bruising to the left flank and left lateral hip.  Patient  admitted to medical service for further evaluation and management.    ____________________________________________   FINAL CLINICAL IMPRESSION(S) / ED DIAGNOSES  Final diagnoses:  Fall, initial encounter  Fx humeral neck, left, closed, initial encounter  Hypokalemia  Slurred speech    Medications  fentaNYL (SUBLIMAZE) injection 50 mcg (50 mcg Intravenous Patient Refused/Not Given 02/12/21 0951)  HYDROcodone-acetaminophen (NORCO/VICODIN) 5-325 MG per tablet 1 tablet (  has no administration in time range)  acetaminophen (TYLENOL) tablet 650 mg (has no administration in time range)  potassium chloride SA (KLOR-CON) CR tablet 40 mEq (has no administration in time range)  aspirin chewable tablet 81 mg (has no administration in time range)  ondansetron (ZOFRAN) injection 4 mg (4 mg Intravenous Given 02/12/21 0730)  iohexol (OMNIPAQUE) 350 MG/ML injection 75 mL (75 mLs Intravenous Contrast Given 02/12/21 0846)     ED Discharge Orders     None        Note:  This document was prepared using Dragon voice recognition software and may include unintentional dictation errors.    Gilles Chiquito, MD 02/12/21 1124

## 2021-02-12 NOTE — ED Notes (Signed)
Pt returned back from CT, Teleneuro activated. Dr. Ether Griffins activated.

## 2021-02-12 NOTE — H&P (Signed)
History and Physical    Brandon Hooper [id number] DOB: 06/22/1945 DOA: 02/12/2021  Referring MD/NP/PA:   PCP: Derinda Late, MD   Patient coming from:  The patient is coming from home.  At baseline, pt is independent for most of ADL.        Chief Complaint: Left arm weakness, facial droop, slurred speech, fall, left shoulder pain  HPI: Brandon Hooper is a 75 y.o. male with medical history significant of hypertension, lymphedema, venous insufficiency change, umbilical hernia, arthritis, who presents with left arm weakness, facial droop, slurred speech, fall, left shoulder pain.  Per his wife at the bedside, patient fell at about 4:30 AM when he got out of the bed.  He complains of left arm weakness and left shoulder pain.  He was noted to have slurred speech and left facial droop.  Both legs are normal.  No loss of consciousness.  Patient has nausea and vomited once earlier, which has resolved.  Currently patient does not have nausea, vomiting, diarrhea or abdominal pain.  No symptoms of UTI.  No chest pain, cough, shortness of breath.  He has history of hypertension, but is currently not taking medications for hypertension.  ED Course: pt was found to have WBC 18.1, procalcitonin <0.01, INR 1.0, alcohol level less than 10, negative COVID PCR, potassium 3.4, renal function okay, temperature normal, blood pressure 139/74, heart rate 85, 17, oxygen saturation 96% on room air.  Negative x-ray for L-spine and left pelvis/hip.  X-ray of left shoulder showed displaced left humeral neck fracture.  MRI showed left cerebellar infarct.  CT angiogram of the head and neck negative for LVO. Pt is placed on MedSurg bed for observation. Dr. Sabra Heck of ortho is consulted by EDP. Dr. Vickki Muff of tele neuro and Dr. Cheral Marker of neurology are consulted.  MRI-brain 1. 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 No associated hemorrhage or mass effect. 2. Mildly limited as  unable to use dedicated head coil for brain imaging due to patient kyphosis. 3. No other acute intracranial abnormality.  X-ray of left shoulder: Mildly comminuted and minimally displaced left humeral neck fracture.  Review of Systems:   General: no fevers, chills, no body weight gain, has fatigue HEENT: no blurry vision, hearing changes or sore throat Respiratory: no dyspnea, coughing, wheezing CV: no chest pain, no palpitations GI: had nausea, vomiting, no abdominal pain, diarrhea, constipation GU: no dysuria, burning on urination, increased urinary frequency, hematuria  Ext: has trace leg edema Neuro: no vision change or hearing loss. Has fall. Has left arm weakness, left facial droop, slurred speech.   Skin: no rash, no skin tear. MSK: Has left shoulder pain Heme: No easy bruising.  Travel history: No recent long distant travel.  Allergy:  Allergies  Allergen Reactions   Erythromycin Hives   Ofloxacin     Other reaction(s): Other (See Comments) "Arthritic pain". The patient reported 5 years of left hip pain when he last took ofloxacin. Likely could try another fluoroquinolone.    Cephalexin Rash    The patient previously tolerated cephalexin in 2013 and reported in 2019 that he developed redness and itching of his knees while on cephalexin. Not entirely clear it was a true allergic reaction.    Clindamycin Hcl Rash   Doxycycline Calcium Rash   Sulfamethoxazole-Trimethoprim Rash   Tetracycline Rash    Past Medical History:  Diagnosis Date   Arthritis    Bone loss    Hypertension  Past Surgical History:  Procedure Laterality Date   EYE SURGERY Bilateral    Feb 2006 and then in Jan 2009    Social History:  reports that he has never smoked. He has never used smokeless tobacco. He reports that he does not currently use alcohol. No history on file for drug use.  Family History:  Family History  Problem Relation Age of Onset   Diabetes Father      Prior to  Admission medications   Not on File    Physical Exam: Vitals:   02/12/21 0748 02/12/21 0946 02/12/21 1000 02/12/21 1030  BP: (!) 152/64 139/76 139/74 (!) 154/89  Pulse: 85 78 72 77  Resp: 17 15 13 10   Temp: 97.6 F (36.4 C)     TempSrc: Oral     SpO2: 96% 99% 100% 98%   General: Not in acute distress HEENT:       Eyes: PERRL, EOMI, no scleral icterus.       ENT: No discharge from the ears and nose, no pharynx injection, no tonsillar enlargement.        Neck: No JVD, no bruit, no mass felt. Heme: No neck lymph node enlargement. Cardiac: S1/S2, RRR, No murmurs, No gallops or rubs. Respiratory: No rales, wheezing, rhonchi or rubs. GI: Soft, nondistended, nontender, no rebound pain, no organomegaly, BS present. GU: No hematuria Ext: has trace leg edema bilaterally. 1+DP/PT pulse bilaterally. Musculoskeletal: has left shoulder tenderness Skin: No rashes.  Neuro: Alert, oriented X3, cranial nerves II-XII grossly intact.  Muscle strength are normal in both legs and right arm.  Due to left arm pain secondary to left humeral neck fracture, difficult to assess muscle strength in left arm. Psych: Patient is not psychotic, no suicidal or hemocidal ideation.  Labs on Admission: I have personally reviewed following labs and imaging studies  CBC: Recent Labs  Lab 02/12/21 0754  WBC 18.1*  NEUTROABS 14.9*  HGB 13.8  HCT 41.1  MCV 93.0  PLT [id number]   Basic Metabolic Panel: Recent Labs  Lab 02/12/21 0754 02/12/21 0830  NA 134*  --   K 3.4*  --   CL 102  --   CO2 23  --   GLUCOSE 185*  --   BUN 15  --   CREATININE 0.79  --   CALCIUM 8.8*  --   MG  --  2.1   GFR: CrCl cannot be calculated (Unknown ideal weight.). Liver Function Tests: Recent Labs  Lab 02/12/21 0754  AST 22  ALT 11  ALKPHOS 66  BILITOT 1.3*  PROT 7.2  ALBUMIN 3.7   No results for input(s): LIPASE, AMYLASE in the last 168 hours. No results for input(s): AMMONIA in the last 168 hours. Coagulation  Profile: Recent Labs  Lab 02/12/21 0754  INR 1.0   Cardiac Enzymes: No results for input(s): CKTOTAL, CKMB, CKMBINDEX, TROPONINI in the last 168 hours. BNP (last 3 results) No results for input(s): PROBNP in the last 8760 hours. HbA1C: No results for input(s): HGBA1C in the last 72 hours. CBG: Recent Labs  Lab 02/12/21 0719  GLUCAP 172*   Lipid Profile: No results for input(s): CHOL, HDL, LDLCALC, TRIG, CHOLHDL, LDLDIRECT in the last 72 hours. Thyroid Function Tests: No results for input(s): TSH, T4TOTAL, FREET4, T3FREE, THYROIDAB in the last 72 hours. Anemia Panel: No results for input(s): VITAMINB12, FOLATE, FERRITIN, TIBC, IRON, RETICCTPCT in the last 72 hours. Urine analysis: No results found for: COLORURINE, APPEARANCEUR, [place], [place], [place], [place], [place], [place],  PROTEINUR, UROBILINOGEN, NITRITE, LEUKOCYTESUR Sepsis Labs: @LABRCNTIP (procalcitonin:4,lacticidven:4) ) Recent Results (from the past 240 hour(s))  Resp Panel by RT-PCR (Flu A&B, Covid) Nasopharyngeal Swab     Status: None   Collection Time: 02/12/21 10:34 AM   Specimen: Nasopharyngeal Swab; Nasopharyngeal(NP) swabs in vial transport medium  Result Value Ref Range Status   SARS Coronavirus 2 by RT PCR NEGATIVE NEGATIVE Final    Comment: (NOTE) SARS-CoV-2 target nucleic acids are NOT DETECTED.  The SARS-CoV-2 RNA is generally detectable in upper respiratory specimens during the acute phase of infection. The lowest concentration of SARS-CoV-2 viral copies this assay can detect is 138 copies/mL. A negative result does not preclude SARS-Cov-2 infection and should not be used as the sole basis for treatment or other patient management decisions. A negative result may occur with  improper specimen collection/handling, submission of specimen other than nasopharyngeal swab, presence of viral mutation(s) within the areas targeted by this assay, and inadequate number of viral copies(<138 copies/mL).  A negative result must be combined with clinical observations, patient history, and epidemiological information. The expected result is Negative.  Fact Sheet for Patients:  EntrepreneurPulse.com.au  Fact Sheet for Healthcare Providers:  IncredibleEmployment.be  This test is no t yet approved or cleared by the [place] FDA and  has been authorized for detection and/or diagnosis of SARS-CoV-2 by FDA under an Emergency Use Authorization (EUA). This EUA will remain  in effect (meaning this test can be used) for the duration of the COVID-19 declaration under Section 564(b)(1) of the Act, 21 U.S.C.section 360bbb-3(b)(1), unless the authorization is terminated  or revoked sooner.       Influenza A by PCR NEGATIVE NEGATIVE Final   Influenza B by PCR NEGATIVE NEGATIVE Final    Comment: (NOTE) The Xpert Xpress SARS-CoV-2/FLU/RSV plus assay is intended as an aid in the diagnosis of influenza from Nasopharyngeal swab specimens and should not be used as a sole basis for treatment. Nasal washings and aspirates are unacceptable for Xpert Xpress SARS-CoV-2/FLU/RSV testing.  Fact Sheet for Patients: EntrepreneurPulse.com.au  Fact Sheet for Healthcare Providers: IncredibleEmployment.be  This test is not yet approved or cleared by the [place] FDA and has been authorized for detection and/or diagnosis of SARS-CoV-2 by FDA under an Emergency Use Authorization (EUA). This EUA will remain in effect (meaning this test can be used) for the duration of the COVID-19 declaration under Section 564(b)(1) of the Act, 21 U.S.C. section 360bbb-3(b)(1), unless the authorization is terminated or revoked.  Performed at [facility] [place], [address] [place], [place] [address]      Radiological Exams on Admission: DG Lumbar Spine 2-3 Views  Result Date: 02/12/2021 CLINICAL DATA:  Fall out of bed last night,  weakness, pain EXAM: LUMBAR SPINE - 2-3 VIEW COMPARISON:  None. FINDINGS: This report assumes 5 non rib-bearing lumbar vertebrae. Lumbar vertebral body heights are preserved, with no fracture. Mild multilevel lumbar degenerative disc disease. No spondylolisthesis. No appreciable facet arthropathy. No aggressive appearing focal osseous lesions. Excreted contrast is seen in the renal collecting systems bilaterally. IMPRESSION: No lumbar spine fracture or spondylolisthesis. Mild multilevel lumbar degenerative disc disease. Electronically Signed   By: Ilona Sorrel M.D.   On: 02/12/2021 09:24   MR BRAIN WO CONTRAST  Result Date: 02/12/2021 CLINICAL DATA:  75 year old male code stroke presentation. Left side symptoms. EXAM: MRI HEAD WITHOUT CONTRAST TECHNIQUE: Multiplanar, multiecho pulse sequences of the brain and surrounding structures were obtained without intravenous contrast. COMPARISON:  Head CT and CTA earlier today.  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 Technologist was unable to use dedicated head coil from brain imaging. Patchy roughly 2 cm area of restricted diffusion in the left superior cerebellar artery territory. See series 15, image 16 and series 13, image 21. Faint T2 and FLAIR hyperintensity in that area. No hemorrhage or mass effect. Nearby small chronic posterior left cerebellar infarct as seen by CT. No other restricted diffusion. No midline shift, mass effect, evidence of mass lesion, ventriculomegaly, extra-axial collection or acute intracranial hemorrhage is evident. Cervicomedullary junction and pituitary are within normal limits. Mild for age patchy bilateral cerebral white matter T2 and FLAIR hyperintensity. Negative deep gray matter nuclei and brainstem. Vascular: Major intracranial vascular flow voids are preserved. Skull and upper cervical spine: Visualized bone marrow signal is within normal limits. Sinuses/Orbits: Postoperative changes to both globes, otherwise  negative. Other: Mastoids are clear.  Negative visible scalp and face. IMPRESSION: 1. 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 No associated hemorrhage or mass effect. 2. Mildly limited as unable to use dedicated head coil for brain imaging due to patient kyphosis. 3. No other acute intracranial abnormality. Electronically Signed   By: Odessa Fleming M.D.   On: 02/12/2021 11:52   DG Chest Portable 1 View  Result Date: 02/12/2021 CLINICAL DATA:  Fall out of bed this morning.  Weakness. EXAM: PORTABLE CHEST 1 VIEW COMPARISON:  None. FINDINGS: Mild cardiomegaly and ectasia of the thoracic aorta noted. Aortic atherosclerotic calcification noted. Band like opacity in the medial left lung base favors atelectasis or infiltrate. No evidence of pleural effusion. The visualized skeletal structures are unremarkable. IMPRESSION: Atelectasis versus infiltrate in medial left lung base. Electronically Signed   By: Danae Orleans M.D.   On: 02/12/2021 08:43   DG Shoulder Left  Result Date: 02/12/2021 CLINICAL DATA:  Fall this morning. Left shoulder pain. Initial encounter. EXAM: LEFT SHOULDER - 2+ VIEW COMPARISON:  None. FINDINGS: A mildly comminuted and minimally displaced fracture is seen involving the left humeral neck. No evidence of dislocation. Degenerative spurring is seen along the inferior margin of the humeral head, without significant joint space narrowing. IMPRESSION: Mildly comminuted and minimally displaced left humeral neck fracture. Electronically Signed   By: Danae Orleans M.D.   On: 02/12/2021 08:44   DG Hip Unilat W or Wo Pelvis 2-3 Views Left  Result Date: 02/12/2021 CLINICAL DATA:  Status post fall. EXAM: DG HIP (WITH OR WITHOUT PELVIS) 2-3V LEFT COMPARISON:  None. FINDINGS: There is no evidence of hip fracture or dislocation. There is no evidence of arthropathy or other focal bone abnormality. IMPRESSION: Negative. Electronically Signed   By: Ted Mcalpine  M.D.   On: 02/12/2021 09:21   CT HEAD CODE STROKE WO CONTRAST`  Addendum Date: 02/12/2021   ADDENDUM REPORT: 02/12/2021 07:41 ADDENDUM: Study discussed by telephone with Dr. Antoine Primas on 02/12/2021 at 0840 hours. Electronically Signed   By: Odessa Fleming M.D.   On: 02/12/2021 07:41   Result Date: 02/12/2021 CLINICAL DATA:  Code stroke. 75 year old male with left side weakness last known well 0430 hours. EXAM: CT HEAD WITHOUT CONTRAST TECHNIQUE: Contiguous axial images were obtained from the base of the skull through the vertex without intravenous contrast. COMPARISON:  None. FINDINGS: Brain: Mild motion. Disproportionate volume loss along the pericallosal and parietooccipital sulci. No midline shift, ventriculomegaly, mass effect, evidence of mass lesion, intracranial hemorrhage or evidence of cortically based acute infarction. Mildly asymmetric white matter hypodensity greater in the  left hemisphere, left parietal lobe. Small chronic infarct left lateral cerebellum. Vascular: Calcified atherosclerosis at the skull base. No suspicious intracranial vascular hyperdensity. Skull: No acute osseous abnormality identified. Sinuses/Orbits: Visualized paranasal sinuses and mastoids are clear. Other: No acute orbit or scalp soft tissue finding. ASPECTS [facility] Stroke Program Early CT Score) Total score (0-10 with 10 being normal): 10 IMPRESSION: 1. No acute cortically based infarct or acute intracranial hemorrhage identified. ASPECTS 10. 2. Asymmetric cerebral white matter disease greater in the left hemisphere. Small chronic left cerebellar infarct. Electronically Signed: By: Genevie Ann M.D. On: 02/12/2021 07:34   CT ANGIO HEAD NECK W WO CM (CODE STROKE)  Result Date: 02/12/2021 CLINICAL DATA:  75 year old male code stroke presentation. Left side weakness. EXAM: CT ANGIOGRAPHY HEAD AND NECK TECHNIQUE: Multidetector CT imaging of the head and neck was performed using the standard protocol during bolus administration of  intravenous contrast. Multiplanar CT image reconstructions and MIPs were obtained to evaluate the vascular anatomy. Carotid stenosis measurements (when applicable) are obtained utilizing NASCET criteria, using the distal internal carotid diameter as the denominator. CONTRAST:  49mL OMNIPAQUE IOHEXOL 350 MG/ML SOLN COMPARISON:  Plain head CT 0724 hours today. FINDINGS: CTA NECK Skeleton: Exaggerated thoracic kyphosis and reversal of cervical lordosis. Widespread interbody ankylosis in the lower cervical and upper thoracic spine from bulky flowing endplate osteophytes. No acute osseous abnormality identified. Upper chest: Minor atelectasis, otherwise negative. Central pulmonary arteries appear to be patent. Other neck: Atrophied bilateral submandibular glands. No acute finding in the neck. Aortic arch: 3 vessel arch configuration with mild calcified arch atherosclerosis. Right carotid system: Negative. Left carotid system: Minimal plaque at the posterior left ICA origin. No stenosis and otherwise negative. Vertebral arteries: Mild calcified plaque at the right subclavian origin without stenosis. Negative right vertebral artery origin and cervical right vertebral artery. Minimal calcified plaque at the left subclavian artery origin without stenosis. Negative left vertebral artery origin and cervical left vertebral artery. CTA HEAD Posterior circulation: Mildly dominant left V4 segment. Patent distal vertebral arteries to the vertebrobasilar junction without plaque or stenosis. Patent PICA origins. Patent basilar artery without stenosis. Patent SCA and PCA origins. Posterior communicating arteries are diminutive or absent. Bilateral PCA branches are within normal limits. Anterior circulation: Both ICA siphons are patent. Mild supraclinoid calcified plaque bilaterally with no significant siphon stenosis. Patent carotid termini. Normal MCA and ACA origins. Anterior communicating artery and bilateral ACA branches are  within normal limits. Left MCA M1 segment and trifurcation appear patent without stenosis. Right MCA M1 segment and bifurcation appear patent without stenosis. Bilateral MCA branches are within normal limits. Venous sinuses: Patent. Anatomic variants: Mildly dominant left vertebral artery. Review of the MIP images confirms the above findings IMPRESSION: 1. Negative for large vessel occlusion. 2. Very mild for age atherosclerosis in the head and neck. No significant arterial stenosis. Mild aortic atherosclerosis. 3. 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 Electronically Signed   By: Genevie Ann M.D.   On: 02/12/2021 09:13     EKG: I have personally reviewed.  Sinus rhythm, QTC 450, low voltage, nonspecific to change   Assessment/Plan Principal Problem:   Stroke [facility]) Active Problems:   Essential hypertension   Fall at home, initial encounter   Hypokalemia   Leukocytosis   Fx humeral neck, left, closed, initial encounter   Stroke [facility]): MRI-brain showed acute on chronic left cerebellar ischemia, acute infarct in the Left SCA  territory. No associated hemorrhage or mass effect. Dr. Vickki Muff of tele neuro was consulted by EDP, who recommended to get CT angiogram of head and neck, which is negative for LVO.  I consulted with Dr. Mendel Ryder of our neurologist.  -Placed on MedSurg bed for observation - will hold oral Bp meds to allow permissive HTN in the setting of acute stroke - start ASA and lipitor - fasting lipid panel and HbA1c  - 2D transthoracic echocardiography  - swallowing screen. If fails, will get SLP - Check UDS  - PT/OT consult  Essential hypertension -IV hydralazine for SBP>220 or dBP>120  Fall at home, initial encounter -PT/OT  Hypokalemia: Potassium 3.4 -Repleted potassium -Check magnesium level  Leukocytosis: WBC 18.1, no source of infection identified.  Likely  reactive -Follow-up with CBC -Follow-up urinalysis  Fx humeral neck, left, closed, initial encounter: Dr. Sabra Heck of orthopedic surgery is consulted, no surgery needed at this moment -Left shoulder sling -Pain control: As needed Percocet, morphine, Tylenol -As needed Robaxin         DVT ppx: SCD Code Status: Full code Family Communication:  Yes, patient's wife   at bed side Disposition Plan:  Anticipate discharge back to previous environment Consults called:   Dr. Sabra Heck of ortho is consulted by EDP. Dr. Vickki Muff of tele neuro and Dr. Cheral Marker of neurology are consulted. Admission status and  Level of care: Med-Surg:    Med-surg bed for obs   Status is: Observation  The patient remains OBS appropriate and will d/c before 2 midnights.         Date of Service 02/12/2021    Ivor Costa [facility]   If 7PM-7AM, please contact night-coverage [email or web address] 02/12/2021, 12:47 PM

## 2021-02-12 NOTE — Progress Notes (Signed)
*  PRELIMINARY RESULTS* Echocardiogram 2D Echocardiogram has been performed.  Lenor Coffin 02/12/2021, 1:33 PM

## 2021-02-12 NOTE — ED Triage Notes (Signed)
Pt to ED via [facility] from home for stroke alert. Pt fell last night out of bed around 0430. Pt reports that he fell because he was weak, pt was able to walk back to bed. Wife called EMS this morning for left sided weakness, slurred speech, and left sided facial droop. Pt is unable to raise left arm but good grips per ems. Pt did not hit his head when he fell and is not on blood thinners, pt is vomiting. Pt has hx/o HTN but does not take medication for it. BP with EMS 138/63, CBG 206, HR 87, SpO2 97% on room air.

## 2021-02-12 NOTE — Progress Notes (Signed)
   02/12/21 1130  Clinical Encounter Type  Visited With Patient and family together  Visit Type Follow-up  Referral From Other (Comment) (rounding)  Spiritual Encounters  Spiritual Needs Emotional;Other (Comment) (general support)  Chaplain Burris engaged pt and spouse. Chaplain provided compassionate presence and active listening to events that brought them to the ED. Pt seems comfortable but chaplain offered an additional blanket and adjusted lighting to encourage rest. Chaplain Burris encouraged spouse's self-care while waiting. Will continue to follow.

## 2021-02-13 DIAGNOSIS — G459 Transient cerebral ischemic attack, unspecified: Secondary | ICD-10-CM | POA: Diagnosis present

## 2021-02-13 DIAGNOSIS — I63542 Cerebral infarction due to unspecified occlusion or stenosis of left cerebellar artery: Secondary | ICD-10-CM | POA: Diagnosis present

## 2021-02-13 DIAGNOSIS — R29810 Facial weakness: Secondary | ICD-10-CM | POA: Diagnosis present

## 2021-02-13 DIAGNOSIS — K429 Umbilical hernia without obstruction or gangrene: Secondary | ICD-10-CM | POA: Diagnosis present

## 2021-02-13 DIAGNOSIS — S42212A Unspecified displaced fracture of surgical neck of left humerus, initial encounter for closed fracture: Secondary | ICD-10-CM | POA: Diagnosis present

## 2021-02-13 DIAGNOSIS — I358 Other nonrheumatic aortic valve disorders: Secondary | ICD-10-CM | POA: Diagnosis present

## 2021-02-13 DIAGNOSIS — Z881 Allergy status to other antibiotic agents status: Secondary | ICD-10-CM | POA: Diagnosis not present

## 2021-02-13 DIAGNOSIS — I700 Atherosclerosis of aorta: Secondary | ICD-10-CM | POA: Diagnosis present

## 2021-02-13 DIAGNOSIS — I10 Essential (primary) hypertension: Secondary | ICD-10-CM | POA: Diagnosis present

## 2021-02-13 DIAGNOSIS — R4781 Slurred speech: Secondary | ICD-10-CM | POA: Diagnosis present

## 2021-02-13 DIAGNOSIS — Z20822 Contact with and (suspected) exposure to covid-19: Secondary | ICD-10-CM | POA: Diagnosis present

## 2021-02-13 DIAGNOSIS — W06XXXA Fall from bed, initial encounter: Secondary | ICD-10-CM | POA: Diagnosis present

## 2021-02-13 DIAGNOSIS — I639 Cerebral infarction, unspecified: Secondary | ICD-10-CM | POA: Diagnosis present

## 2021-02-13 DIAGNOSIS — Z882 Allergy status to sulfonamides status: Secondary | ICD-10-CM | POA: Diagnosis not present

## 2021-02-13 DIAGNOSIS — I872 Venous insufficiency (chronic) (peripheral): Secondary | ICD-10-CM | POA: Diagnosis present

## 2021-02-13 DIAGNOSIS — M1990 Unspecified osteoarthritis, unspecified site: Secondary | ICD-10-CM | POA: Diagnosis present

## 2021-02-13 DIAGNOSIS — R29703 NIHSS score 3: Secondary | ICD-10-CM | POA: Diagnosis present

## 2021-02-13 DIAGNOSIS — Z7982 Long term (current) use of aspirin: Secondary | ICD-10-CM | POA: Diagnosis not present

## 2021-02-13 DIAGNOSIS — Z6832 Body mass index (BMI) 32.0-32.9, adult: Secondary | ICD-10-CM | POA: Diagnosis not present

## 2021-02-13 DIAGNOSIS — E669 Obesity, unspecified: Secondary | ICD-10-CM | POA: Diagnosis present

## 2021-02-13 DIAGNOSIS — Z833 Family history of diabetes mellitus: Secondary | ICD-10-CM | POA: Diagnosis not present

## 2021-02-13 DIAGNOSIS — E876 Hypokalemia: Secondary | ICD-10-CM | POA: Diagnosis present

## 2021-02-13 LAB — CBC
HCT: 40 % (ref 39.0–52.0)
Hemoglobin: 13.5 g/dL (ref 13.0–17.0)
MCH: 31 pg (ref 26.0–34.0)
MCHC: 33.8 g/dL (ref 30.0–36.0)
MCV: 91.7 fL (ref 80.0–100.0)
Platelets: 236 10*3/uL (ref 150–400)
RBC: 4.36 MIL/uL (ref 4.22–5.81)
RDW: 14.1 % (ref 11.5–15.5)
WBC: 13.2 10*3/uL — ABNORMAL HIGH (ref 4.0–10.5)
nRBC: 0 % (ref 0.0–0.2)

## 2021-02-13 LAB — BASIC METABOLIC PANEL
Anion gap: 5 (ref 5–15)
BUN: 14 mg/dL (ref 8–23)
CO2: 24 mmol/L (ref 22–32)
Calcium: 9.1 mg/dL (ref 8.9–10.3)
Chloride: 106 mmol/L (ref 98–111)
Creatinine, Ser: 0.74 mg/dL (ref 0.61–1.24)
GFR, Estimated: >60 mL/min (ref >60)
Glucose, Bld: 127 mg/dL — ABNORMAL HIGH (ref 70–99)
Potassium: 4.2 mmol/L (ref 3.5–5.1)
Sodium: 135 mmol/L (ref 135–145)

## 2021-02-13 LAB — LIPID PANEL
Cholesterol: 142 mg/dL (ref 0–200)
HDL: 56 mg/dL (ref >40)
LDL Cholesterol: 77 mg/dL (ref 0–99)
Total CHOL/HDL Ratio: 2.5 RATIO
Triglycerides: 43 mg/dL (ref <150)
VLDL: 9 mg/dL (ref 0–40)

## 2021-02-13 LAB — HEMOGLOBIN A1C
Hgb A1c MFr Bld: 5.6 % (ref 4.8–5.6)
Mean Plasma Glucose: 114.02 mg/dL

## 2021-02-13 MED ORDER — LACTATED RINGERS IV SOLN: INTRAVENOUS | Status: DC

## 2021-02-13 MED ORDER — ONDANSETRON HCL 4 MG/2ML IJ SOLN
4.0000 mg | Freq: Once | INTRAMUSCULAR | Status: AC
  Administered 2021-02-13: 4 mg via INTRAVENOUS

## 2021-02-13 MED ORDER — ASPIRIN 81 MG PO CHEW
81.0000 mg | CHEWABLE_TABLET | Freq: Every day | ORAL | Status: DC
  Administered 2021-02-14 – 2021-02-15 (×2): 81 mg via ORAL
  Filled 2021-02-13 (×2): qty 1

## 2021-02-13 MED ORDER — ASPIRIN 81 MG PO CHEW: 81.0000 mg | CHEWABLE_TABLET | Freq: Every day | ORAL | Status: DC

## 2021-02-13 MED ORDER — ENOXAPARIN SODIUM 40 MG/0.4ML IJ SOSY
40.0000 mg | PREFILLED_SYRINGE | INTRAMUSCULAR | Status: DC
  Administered 2021-02-13 – 2021-02-14 (×2): 40 mg via SUBCUTANEOUS
  Filled 2021-02-13 (×2): qty 0.4

## 2021-02-13 NOTE — Progress Notes (Signed)
PROGRESS NOTE    Brandon Hooper  [id number] DOB: 02/14/1946 DOA: 02/12/2021 PCP: Derinda Late, MD   Chief complaint.  Left arm weakness. Brief Narrative:   Brandon Hooper is a 75 y.o. male with medical history significant of hypertension, lymphedema, venous insufficiency change, umbilical hernia, arthritis, who presents with left arm weakness, facial droop, slurred speech, fall, left shoulder pain. X-ray showed left humeral neck fracture.  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  Assessment & Plan:   Principal Problem:   Stroke [facility]) Active Problems:   Essential hypertension   Fall at home, initial encounter   Hypokalemia   Leukocytosis   Fx humeral neck, left, closed, initial encounter  Acute left cerebellar stroke. Patient has been evaluated by neurology, still pending echocardiogram. Continue aspirin and Lipitor. Continue PT/OT. Patient has significant nausea vomiting since last night, could not tolerate diet.  This is probably secondary to new stroke.  I will place on some fluids with lactated Ringer 100 mL/h.  I will keep patient more 1 more night.  Essential hypertension Most blood pressure medicines.  Left humeral neck fracture. Evaluated by orthopedics, no surgery needed.        DVT prophylaxis: Lovenox Code Status: full Family Communication:  Disposition Plan:    Status is: Observation        I/O last 3 completed shifts: In: 118 [P.O.:118] Out: -  No intake/output data recorded.     Consultants:  Neurology  Procedures: None  Antimicrobials: None  Subjective: Still complaining of nausea vomiting since last night, not able to tolerate diet.  He had a normal bowel movement yesterday. No abdominal pain. Denies any short of breath or cough. No dysuria hematuria    Objective: Vitals:   02/13/21 0535 02/13/21 0535 02/13/21 0535 02/13/21 0800  BP: 136/66 136/66 136/66 122/65  Pulse: 87 92 94 81  Resp: 18 18  18 16   Temp: 100 F (37.8 C) 100 F (37.8 C) 100 F (37.8 C) 99.9 F (37.7 C)  TempSrc: Oral Oral Oral Oral  SpO2:  95% 95% 97%  Weight:      Height:        Intake/Output Summary (Last 24 hours) at 02/13/2021 1116 Last data filed at 02/12/2021 2237 Gross per 24 hour  Intake 118 ml  Output --  Net 118 ml   Filed Weights   02/12/21 1825  Weight: 107.3 kg    Examination:  General exam: Appears calm and comfortable  Respiratory system: Clear to auscultation. Respiratory effort normal. Cardiovascular system: S1 & S2 heard, RRR. No JVD, murmurs, rubs, gallops or clicks. No pedal edema. Gastrointestinal system: Abdomen is nondistended, soft and nontender. No organomegaly or masses felt. Normal bowel sounds heard. Central nervous system: Alert and oriented. No focal neurological deficits. Extremities: Symmetric 5 x 5 power. Skin: No rashes, lesions or ulcers Psychiatry: Judgement and insight appear normal. Mood & affect appropriate.     Data Reviewed: I have personally reviewed following labs and imaging studies  CBC: Recent Labs  Lab 02/12/21 0754 02/13/21 0413  WBC 18.1* 13.2*  NEUTROABS 14.9*  --   HGB 13.8 13.5  HCT 41.1 40.0  MCV 93.0 91.7  PLT 242 [id number]   Basic Metabolic Panel: Recent Labs  Lab 02/12/21 0754 02/12/21 0830 02/13/21 0413  NA 134*  --  135  K 3.4*  --  4.2  CL 102  --  106  CO2 23  --  24  GLUCOSE 185*  --  127*  BUN 15  --  14  CREATININE 0.79  --  0.74  CALCIUM 8.8*  --  9.1  MG  --  2.1  --    GFR: Estimated Creatinine Clearance: 104.1 mL/min (by C-G formula based on SCr of 0.74 mg/dL). Liver Function Tests: Recent Labs  Lab 02/12/21 0754  AST 22  ALT 11  ALKPHOS 66  BILITOT 1.3*  PROT 7.2  ALBUMIN 3.7   No results for input(s): LIPASE, AMYLASE in the last 168 hours. No results for input(s): AMMONIA in the last 168 hours. Coagulation Profile: Recent Labs  Lab 02/12/21 0754  INR 1.0   Cardiac Enzymes: No results for  input(s): CKTOTAL, CKMB, CKMBINDEX, TROPONINI in the last 168 hours. BNP (last 3 results) No results for input(s): PROBNP in the last 8760 hours. HbA1C: Recent Labs    02/12/21 0754 02/13/21 0413  HGBA1C 5.6 5.6   CBG: Recent Labs  Lab 02/12/21 0719  GLUCAP 172*   Lipid Profile: Recent Labs    02/13/21 0413  CHOL 142  HDL 56  LDLCALC 77  TRIG 43  CHOLHDL 2.5   Thyroid Function Tests: No results for input(s): TSH, T4TOTAL, FREET4, T3FREE, THYROIDAB in the last 72 hours. Anemia Panel: No results for input(s): VITAMINB12, FOLATE, FERRITIN, TIBC, IRON, RETICCTPCT in the last 72 hours. Sepsis Labs: Recent Labs  Lab 02/12/21 0830  PROCALCITON <0.10    Recent Results (from the past 240 hour(s))  Resp Panel by RT-PCR (Flu A&B, Covid) Nasopharyngeal Swab     Status: None   Collection Time: 02/12/21 10:34 AM   Specimen: Nasopharyngeal Swab; Nasopharyngeal(NP) swabs in vial transport medium  Result Value Ref Range Status   SARS Coronavirus 2 by RT PCR NEGATIVE NEGATIVE Final    Comment: (NOTE) SARS-CoV-2 target nucleic acids are NOT DETECTED.  The SARS-CoV-2 RNA is generally detectable in upper respiratory specimens during the acute phase of infection. The lowest concentration of SARS-CoV-2 viral copies this assay can detect is 138 copies/mL. A negative result does not preclude SARS-Cov-2 infection and should not be used as the sole basis for treatment or other patient management decisions. A negative result may occur with  improper specimen collection/handling, submission of specimen other than nasopharyngeal swab, presence of viral mutation(s) within the areas targeted by this assay, and inadequate number of viral copies(<138 copies/mL). A negative result must be combined with clinical observations, patient history, and epidemiological information. The expected result is Negative.  Fact Sheet for Patients:  EntrepreneurPulse.com.au  Fact Sheet for  Healthcare Providers:  IncredibleEmployment.be  This test is no t yet approved or cleared by the [place] FDA and  has been authorized for detection and/or diagnosis of SARS-CoV-2 by FDA under an Emergency Use Authorization (EUA). This EUA will remain  in effect (meaning this test can be used) for the duration of the COVID-19 declaration under Section 564(b)(1) of the Act, 21 U.S.C.section 360bbb-3(b)(1), unless the authorization is terminated  or revoked sooner.       Influenza A by PCR NEGATIVE NEGATIVE Final   Influenza B by PCR NEGATIVE NEGATIVE Final    Comment: (NOTE) The Xpert Xpress SARS-CoV-2/FLU/RSV plus assay is intended as an aid in the diagnosis of influenza from Nasopharyngeal swab specimens and should not be used as a sole basis for treatment. Nasal washings and aspirates are unacceptable for Xpert Xpress SARS-CoV-2/FLU/RSV testing.  Fact Sheet for Patients: EntrepreneurPulse.com.au  Fact Sheet for Healthcare Providers: IncredibleEmployment.be  This test is not yet approved or cleared by  the [employer] and has been authorized for detection and/or diagnosis of SARS-CoV-2 by FDA under an Emergency Use Authorization (EUA). This EUA will remain in effect (meaning this test can be used) for the duration of the COVID-19 declaration under Section 564(b)(1) of the Act, 21 U.S.C. section 360bbb-3(b)(1), unless the authorization is terminated or revoked.  Performed at [facility], [address], [place], [place] [address]          Radiology Studies: DG Lumbar Spine 2-3 Views  Result Date: 02/12/2021 CLINICAL DATA:  Fall out of bed last night, weakness, pain EXAM: LUMBAR SPINE - 2-3 VIEW COMPARISON:  None. FINDINGS: This report assumes 5 non rib-bearing lumbar vertebrae. Lumbar vertebral body heights are preserved, with no fracture. Mild multilevel lumbar degenerative disc disease. No  spondylolisthesis. No appreciable facet arthropathy. No aggressive appearing focal osseous lesions. Excreted contrast is seen in the renal collecting systems bilaterally. IMPRESSION: No lumbar spine fracture or spondylolisthesis. Mild multilevel lumbar degenerative disc disease. Electronically Signed   By: Ilona Sorrel M.D.   On: 02/12/2021 09:24   MR BRAIN WO CONTRAST  Result Date: 02/12/2021 CLINICAL DATA:  75 year old male code stroke presentation. Left side symptoms. EXAM: MRI HEAD WITHOUT CONTRAST TECHNIQUE: Multiplanar, multiecho pulse sequences of the brain and surrounding structures were obtained without intravenous contrast. COMPARISON:  Head CT and CTA earlier today. 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 Technologist was unable to use dedicated head coil from brain imaging. Patchy roughly 2 cm area of restricted diffusion in the left superior cerebellar artery territory. See series 15, image 16 and series 13, image 21. Faint T2 and FLAIR hyperintensity in that area. No hemorrhage or mass effect. Nearby small chronic posterior left cerebellar infarct as seen by CT. No other restricted diffusion. No midline shift, mass effect, evidence of mass lesion, ventriculomegaly, extra-axial collection or acute intracranial hemorrhage is evident. Cervicomedullary junction and pituitary are within normal limits. Mild for age patchy bilateral cerebral white matter T2 and FLAIR hyperintensity. Negative deep gray matter nuclei and brainstem. Vascular: Major intracranial vascular flow voids are preserved. Skull and upper cervical spine: Visualized bone marrow signal is within normal limits. Sinuses/Orbits: Postoperative changes to both globes, otherwise negative. Other: Mastoids are clear.  Negative visible scalp and face. IMPRESSION: 1. 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 No associated hemorrhage or mass effect. 2. Mildly limited as unable to use  dedicated head coil for brain imaging due to patient kyphosis. 3. No other acute intracranial abnormality. Electronically Signed   By: Genevie Ann M.D.   On: 02/12/2021 11:52   DG Chest Portable 1 View  Result Date: 02/12/2021 CLINICAL DATA:  Fall out of bed this morning.  Weakness. EXAM: PORTABLE CHEST 1 VIEW COMPARISON:  None. FINDINGS: Mild cardiomegaly and ectasia of the thoracic aorta noted. Aortic atherosclerotic calcification noted. Band like opacity in the medial left lung base favors atelectasis or infiltrate. No evidence of pleural effusion. The visualized skeletal structures are unremarkable. IMPRESSION: Atelectasis versus infiltrate in medial left lung base. Electronically Signed   By: Marlaine Hind M.D.   On: 02/12/2021 08:43   DG Shoulder Left  Result Date: 02/12/2021 CLINICAL DATA:  Fall this morning. Left shoulder pain. Initial encounter. EXAM: LEFT SHOULDER - 2+ VIEW COMPARISON:  None. FINDINGS: A mildly comminuted and minimally displaced fracture is seen involving the left humeral neck. No evidence of dislocation. Degenerative spurring is seen along the inferior margin of the humeral  head, without significant joint space narrowing. IMPRESSION: Mildly comminuted and minimally displaced left humeral neck fracture. Electronically Signed   By: Marlaine Hind M.D.   On: 02/12/2021 08:44   ECHOCARDIOGRAM COMPLETE  Result Date: 02/12/2021    ECHOCARDIOGRAM REPORT   Patient Name:   DARIEON SCHAAL Date of Exam: 02/12/2021 Medical Rec #:  [id number]      Height:       68.0 in Accession #:    [id number]     Weight:       226.0 lb Date of Birth:  01-Jun-1945     BSA:          2.153 m Patient Age:    44 years       BP:           154/89 mmHg Patient Gender: M              HR:           74 bpm. Exam Location:  [facility] Procedure: 2D Echo and Strain Analysis Indications:     Stroke I63.9  History:         Patient has no prior history of Echocardiogram examinations.  Sonographer:     Kathlen Brunswick RDCS Referring  Phys:  Unknown Foley NIU Diagnosing Phys: Neoma Laming  Sonographer Comments: Global longitudinal strain was attempted. IMPRESSIONS  1. Left ventricular ejection fraction, by estimation, is 60 to 65%. The left ventricle has normal function. The left ventricle has no regional wall motion abnormalities. Left ventricular diastolic parameters are consistent with Grade I diastolic dysfunction (impaired relaxation).  2. Right ventricular systolic function is normal. The right ventricular size is normal.  3. The mitral valve is normal in structure. Trivial mitral valve regurgitation. No evidence of mitral stenosis.  4. The aortic valve is normal in structure. Aortic valve regurgitation is not visualized. Mild aortic valve sclerosis is present, with no evidence of aortic valve stenosis.  5. The inferior vena cava is normal in size with greater than 50% respiratory variability, suggesting right atrial pressure of 3 mmHg. FINDINGS  Left Ventricle: Left ventricular ejection fraction, by estimation, is 60 to 65%. The left ventricle has normal function. The left ventricle has no regional wall motion abnormalities. The left ventricular internal cavity size was normal in size. There is  no left ventricular hypertrophy. Left ventricular diastolic parameters are consistent with Grade I diastolic dysfunction (impaired relaxation). Right Ventricle: The right ventricular size is normal. No increase in right ventricular wall thickness. Right ventricular systolic function is normal. Left Atrium: Left atrial size was normal in size. Right Atrium: Right atrial size was normal in size. Pericardium: There is no evidence of pericardial effusion. Mitral Valve: The mitral valve is normal in structure. Trivial mitral valve regurgitation. No evidence of mitral valve stenosis. Tricuspid Valve: The tricuspid valve is normal in structure. Tricuspid valve regurgitation is trivial. No evidence of tricuspid stenosis. Aortic Valve: The aortic valve is  normal in structure. Aortic valve regurgitation is not visualized. Mild aortic valve sclerosis is present, with no evidence of aortic valve stenosis. Aortic valve peak gradient measures 8.8 mmHg. Pulmonic Valve: The pulmonic valve was normal in structure. Pulmonic valve regurgitation is not visualized. No evidence of pulmonic stenosis. Aorta: The aortic root is normal in size and structure. Venous: The inferior vena cava is normal in size with greater than 50% respiratory variability, suggesting right atrial pressure of 3 mmHg. IAS/Shunts: No atrial level shunt detected by color flow Doppler.  LEFT  VENTRICLE PLAX 2D LVIDd:         4.37 cm   Diastology LVIDs:         2.87 cm   LV e' medial:    8.70 cm/s LV PW:         1.22 cm   LV E/e' medial:  7.8 LV IVS:        1.13 cm   LV e' lateral:   9.14 cm/s LVOT diam:     2.10 cm   LV E/e' lateral: 7.4 LV SV:         74 LV SV Index:   34 LVOT Area:     3.46 cm  RIGHT VENTRICLE RV Basal diam:  2.29 cm RV S prime:     12.70 cm/s TAPSE (M-mode): 2.0 cm LEFT ATRIUM             Index        RIGHT ATRIUM           Index LA diam:        3.10 cm 1.44 cm/m   RA Area:     10.10 cm LA Vol (A2C):   51.3 ml 23.83 ml/m  RA Volume:   17.40 ml  8.08 ml/m LA Vol (A4C):   35.2 ml 16.35 ml/m LA Biplane Vol: 42.7 ml 19.84 ml/m  AORTIC VALVE                 PULMONIC VALVE AV Area (Vmax): 2.21 cm     PV Vmax:       0.88 m/s AV Vmax:        148.00 cm/s  PV Peak grad:  3.1 mmHg AV Peak Grad:   8.8 mmHg LVOT Vmax:      94.60 cm/s LVOT Vmean:     60.300 cm/s LVOT VTI:       0.213 m  AORTA Ao Root diam: 3.50 cm Ao Asc diam:  3.40 cm MITRAL VALVE MV Area (PHT): 3.77 cm    SHUNTS MV Decel Time: 201 msec    Systemic VTI:  0.21 m MV E velocity: 67.70 cm/s  Systemic Diam: 2.10 cm MV A velocity: 99.00 cm/s MV E/A ratio:  0.68 Shaukat Khan Electronically signed by Neoma Laming Signature Date/Time: 02/12/2021/4:00:53 PM    Final    DG Hip Unilat W or Wo Pelvis 2-3 Views Left  Result Date:  02/12/2021 CLINICAL DATA:  Status post fall. EXAM: DG HIP (WITH OR WITHOUT PELVIS) 2-3V LEFT COMPARISON:  None. FINDINGS: There is no evidence of hip fracture or dislocation. There is no evidence of arthropathy or other focal bone abnormality. IMPRESSION: Negative. Electronically Signed   By: Fidela Salisbury M.D.   On: 02/12/2021 09:21   CT HEAD CODE STROKE WO CONTRAST`  Addendum Date: 02/12/2021   ADDENDUM REPORT: 02/12/2021 07:41 ADDENDUM: Study discussed by telephone with Dr. Hulan Saas on 02/12/2021 at 0840 hours. Electronically Signed   By: Genevie Ann M.D.   On: 02/12/2021 07:41   Result Date: 02/12/2021 CLINICAL DATA:  Code stroke. 75 year old male with left side weakness last known well 0430 hours. EXAM: CT HEAD WITHOUT CONTRAST TECHNIQUE: Contiguous axial images were obtained from the base of the skull through the vertex without intravenous contrast. COMPARISON:  None. FINDINGS: Brain: Mild motion. Disproportionate volume loss along the pericallosal and parietooccipital sulci. No midline shift, ventriculomegaly, mass effect, evidence of mass lesion, intracranial hemorrhage or evidence of cortically based acute infarction. Mildly asymmetric white matter hypodensity greater in  the left hemisphere, left parietal lobe. Small chronic infarct left lateral cerebellum. Vascular: Calcified atherosclerosis at the skull base. No suspicious intracranial vascular hyperdensity. Skull: No acute osseous abnormality identified. Sinuses/Orbits: Visualized paranasal sinuses and mastoids are clear. Other: No acute orbit or scalp soft tissue finding. ASPECTS [facility] Stroke Program Early CT Score) Total score (0-10 with 10 being normal): 10 IMPRESSION: 1. No acute cortically based infarct or acute intracranial hemorrhage identified. ASPECTS 10. 2. Asymmetric cerebral white matter disease greater in the left hemisphere. Small chronic left cerebellar infarct. Electronically Signed: By: Genevie Ann M.D. On: 02/12/2021 07:34    CT ANGIO HEAD NECK W WO CM (CODE STROKE)  Result Date: 02/12/2021 CLINICAL DATA:  75 year old male code stroke presentation. Left side weakness. EXAM: CT ANGIOGRAPHY HEAD AND NECK TECHNIQUE: Multidetector CT imaging of the head and neck was performed using the standard protocol during bolus administration of intravenous contrast. Multiplanar CT image reconstructions and MIPs were obtained to evaluate the vascular anatomy. Carotid stenosis measurements (when applicable) are obtained utilizing NASCET criteria, using the distal internal carotid diameter as the denominator. CONTRAST:  74mL OMNIPAQUE IOHEXOL 350 MG/ML SOLN COMPARISON:  Plain head CT 0724 hours today. FINDINGS: CTA NECK Skeleton: Exaggerated thoracic kyphosis and reversal of cervical lordosis. Widespread interbody ankylosis in the lower cervical and upper thoracic spine from bulky flowing endplate osteophytes. No acute osseous abnormality identified. Upper chest: Minor atelectasis, otherwise negative. Central pulmonary arteries appear to be patent. Other neck: Atrophied bilateral submandibular glands. No acute finding in the neck. Aortic arch: 3 vessel arch configuration with mild calcified arch atherosclerosis. Right carotid system: Negative. Left carotid system: Minimal plaque at the posterior left ICA origin. No stenosis and otherwise negative. Vertebral arteries: Mild calcified plaque at the right subclavian origin without stenosis. Negative right vertebral artery origin and cervical right vertebral artery. Minimal calcified plaque at the left subclavian artery origin without stenosis. Negative left vertebral artery origin and cervical left vertebral artery. CTA HEAD Posterior circulation: Mildly dominant left V4 segment. Patent distal vertebral arteries to the vertebrobasilar junction without plaque or stenosis. Patent PICA origins. Patent basilar artery without stenosis. Patent SCA and PCA origins. Posterior communicating arteries are  diminutive or absent. Bilateral PCA branches are within normal limits. Anterior circulation: Both ICA siphons are patent. Mild supraclinoid calcified plaque bilaterally with no significant siphon stenosis. Patent carotid termini. Normal MCA and ACA origins. Anterior communicating artery and bilateral ACA branches are within normal limits. Left MCA M1 segment and trifurcation appear patent without stenosis. Right MCA M1 segment and bifurcation appear patent without stenosis. Bilateral MCA branches are within normal limits. Venous sinuses: Patent. Anatomic variants: Mildly dominant left vertebral artery. Review of the MIP images confirms the above findings IMPRESSION: 1. Negative for large vessel occlusion. 2. Very mild for age atherosclerosis in the head and neck. No significant arterial stenosis. Mild aortic atherosclerosis. 3. 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 Electronically Signed   By: Genevie Ann M.D.   On: 02/12/2021 09:13        Scheduled Meds:  acetaminophen  650 mg Oral Once   atorvastatin  40 mg Oral Daily   HYDROcodone-acetaminophen  1 tablet Oral Once   Continuous Infusions:  lactated ringers       LOS: 0 days    Time spent: 27 minutes    Sharen Hones, MD [facility]   To contact the attending provider between 7A-7P or  the covering provider during after hours 7P-7A, please log into the web site [email or web address] and access using universal [place] password for that web site. If you do not have the password, please call the hospital operator.  02/13/2021, 11:16 AM

## 2021-02-13 NOTE — Evaluation (Addendum)
Speech Language Pathology Evaluation Patient Details Name: Brandon Hooper MRN: [id number] DOB: 26-Jan-1946 Today's Date: 02/13/2021 Time: 2706-2376 SLP Time Calculation (min) (ACUTE ONLY): 45 min  Problem List:  Patient Active Problem List   Diagnosis Date Noted   Acute stroke due to ischemia (HCC) 02/13/2021   Stroke (HCC) 02/12/2021   Fall at home, initial encounter 02/12/2021   Hypokalemia 02/12/2021   Leukocytosis 02/12/2021   Fx humeral neck, left, closed, initial encounter    Lymphedema 05/20/2018   Venous ulcer of ankle, left (HCC) 02/11/2018   Venous insufficiency 12/24/2017   Essential hypertension 12/24/2017   Umbilical hernia without obstruction and without gangrene 12/24/2017   Past Medical History:  Past Medical History:  Diagnosis Date   Arthritis    Bone loss    Hypertension    Past Surgical History:  Past Surgical History:  Procedure Laterality Date   EYE SURGERY Bilateral    Feb 2006 and then in Jan 2009   HPI:  Pt Pt is a 75 y.o. male with medical history significant of hypertension, lymphedema, venous insufficiency change, umbilical hernia, arthritis, who presents with left arm weakness, facial droop, slurred speech, a fall w/ left shoulder pain. X-ray showed left humeral neck fracture of shoulder.  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 Per his wife at the bedside, patient fell at about 4:30 AM when he got out of the bed.  He complained of left arm weakness and left shoulder pain after that.   Assessment / Plan / Recommendation Clinical Impression  Patient completed informal cognitive-linguistic assessment today at bedside. General Content and Fluency during both basic/complex conversation was [facility]. Pt stated he can be easily distracted when "multiple things are going on at one time", so he was encouraged to take his time during conversation w/ others, especially in his responses -- slowing down can offer the opportunity for full  comprehension of information and formulation of responses. Pt answered basic/complex questions re: his situation; he was able to recall conversations and recommendations from the MD re: his shoulder as well as exercises and recommendation for a 4-pronged cane from PT. Pt followed Sequential Commands, requested needs/wants, and identified problem situations during his meal and corrected them. Pt was A/O x4 recalling he just celebrated a birthday. No overt receptive or expressive language deficits noted. No overt motor speech deficits noted; no dysarthria. Speech 100% intelligible. Pt stated his speech was at his baseline.  SLP provided education re: importance of taking his time during conversations and tasks, especially in unknown/new situation such as these currently. Instructed pt to ask for clarification if needed during medical conversations re: his status/health/POC. No further skilled ST services indicated at this time as pt appears close to/at his baseline per performance/self-report. If any needs post discharge, PCP can refer for services. Pt agreed.   Addendum: pt consumed several bites/sips of various consistencies during his Lunch meal w/ No overt clinical s/s of aspiration noted. Pt fed self w/ setup support d/t LUE in sling.   SLP Assessment  SLP Recommendation/Assessment: Patient does not need any further Speech Lanaguage Pathology Services (if any deficits noted post discharge(higher level tasks), then pt can f/u w/ PCP for referral) SLP Visit Diagnosis: Cognitive communication deficit (R41.841) (appears close to/at his Baseline per performance and report)    Recommendations for follow up therapy are one component of a multi-disciplinary discharge planning process, led by the attending physician.  Recommendations may be updated based on patient status, additional functional criteria and  insurance authorization.    Follow Up Recommendations  None    Frequency and Duration  (n/a)    (n/a)      SLP Evaluation Cognition  Overall Cognitive Status: Within Functional Limits for tasks assessed Arousal/Alertness: Awake/alert Orientation Level: Oriented X4 Year: 2022 Month: November Attention: Focused;Sustained Focused Attention: Appears intact Sustained Attention: Appears intact Memory: Appears intact (he recalled information from MD's visit this morning re: his shoulder) Awareness: Appears intact Problem Solving: Appears intact Executive Function: Reasoning;Organizing (items in bed and during his meal) Reasoning: Appears intact Organizing: Appears intact Behaviors:  (no overt) Safety/Judgment: Appears intact Comments: re: sitting up and his meal       Comprehension  Auditory Comprehension Overall Auditory Comprehension: Appears within functional limits for tasks assessed Yes/No Questions: Within Functional Limits Commands: Within Functional Limits Conversation: Complex Other Conversation Comments: discussed recommendations from his MDs re; his shoulder and need for therapy Interfering Components:  (took his time during conversational exchanges - WFL) EffectiveTechniques: Slowed speech (taking his time) IT trainer Discrimination: Not tested Reading Comprehension Reading Status: Not tested    Expression Expression Primary Mode of Expression: Verbal Verbal Expression Overall Verbal Expression: Appears within functional limits for tasks assessed (encouraged to take his time during conversational exchanges) Initiation: No impairment Automatic Speech: Name;Social Response;Counting;Day of week Level of Generative/Spontaneous Verbalization: Conversation Repetition: No impairment Naming: No impairment Pragmatics: No impairment (no overt) Non-Verbal Means of Communication: Not applicable Written Expression Dominant Hand: Right Written Expression: Not tested   Oral / Motor  Oral Motor/Sensory Function Overall Oral Motor/Sensory  Function: Within functional limits Motor Speech Overall Motor Speech: Appears within functional limits for tasks assessed Respiration: Within functional limits Phonation: Normal Resonance: Within functional limits Articulation: Within functional limitis Intelligibility: Intelligible Motor Planning: Witnin functional limits Motor Speech Errors: Not applicable   GO                      Jerilynn Som, MS, CCC-SLP Speech Language Pathologist Rehab Services [phone]  [facility] 02/13/2021, 3:02 PM

## 2021-02-13 NOTE — Evaluation (Signed)
Physical Therapy Evaluation Patient Details Name: Brandon Hooper MRN: [id number] DOB: 08-Dec-1945 Today's Date: 02/13/2021  History of Present Illness  Pt is a 75 y.o. male with medical history significant of hypertension, lymphedema, venous insufficiency change, umbilical hernia, arthritis, who presents with left arm weakness, facial droop, slurred speech, fall, left shoulder pain. X-ray showed left humeral neck fracture.  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   Clinical Impression  Pt seated EOB working with OT beginning of session, agreeable to continue with PT. Pt states PLOF as indep to mod-I w/ SPC for mobility. Pt denies falls within last six months and does not require assist for ADLs. Pt presents with primary limitations of functional mobility, decreased balance and strength. Pt requires MAX-A returning to bed and several attempts at STS with varying levels of assistance. Pt was able to achieve MIN-A STS but is slightly unstable once standing requiring PT assist. Pt presents with increased thoracic kyphosis limiting overall anterior weight shifting ability without the use of BUE reaching forward. Due to changes from [facility] SNF is recommended from today's evaluation. PT will continue assessment of discharge recommendations with continued treatment. Skilled PT intervention is indicated to address deficits in function, mobility, and to return to PLOF as able.       Recommendations for follow up therapy are one component of a multi-disciplinary discharge planning process, led by the attending physician.  Recommendations may be updated based on patient status, additional functional criteria and insurance authorization.  Follow Up Recommendations Skilled nursing-short term rehab (<3 hours/day)    Assistance Recommended at Discharge Intermittent Supervision/Assistance  Functional Status Assessment Patient has had a recent decline in their functional status and demonstrates the  ability to make significant improvements in function in a reasonable and predictable amount of time.  Equipment Recommendations  Other (comment) (TBD next venue of care)    Recommendations for Other Services       Precautions / Restrictions Precautions Precautions: Fall Required Braces or Orthoses: Sling Restrictions Weight Bearing Restrictions: Yes LUE Weight Bearing: Non weight bearing      Mobility  Bed Mobility Overal bed mobility: Needs Assistance Bed Mobility: Sit to Supine     Supine to sit: Max assist;+2 for physical assistance Sit to supine: Max assist;+2 for physical assistance   General bed mobility comments: Max-A for trunk positioning and BLUE    Transfers Overall transfer level: Needs assistance Equipment used: Hemi-walker;Straight cane Transfers: Sit to/from Stand Sit to Stand: Min assist;Mod assist           General transfer comment: x 4 STS w/ initial without AD + 1 HHA, MAX-A; progressed to MIN-A by pulling up on hemi-walker x 1 and was able to stand by reaching R arm 90 degrees FF for anterior momentum but required MIN-A w/ standing for LOB; CGA x 1 w/ SPC after x 2 attempts    Ambulation/Gait Ambulation/Gait assistance: Min guard Gait Distance (Feet): 3 Feet Assistive device: Hemi-walker Gait Pattern/deviations: Step-to pattern Gait velocity: decreased     General Gait Details: Stepped forward, backward, side to side, able to maneuver hemi-walker w/ cues for weight shifting  Stairs            Wheelchair Mobility    Modified Rankin (Stroke Patients Only)       Balance Overall balance assessment: Needs assistance Sitting-balance support: Feet supported;No upper extremity supported Sitting balance-Leahy Scale: Fair Sitting balance - Comments: posterior lean upon sitting up right   Standing balance support: Single  extremity supported;During functional activity Standing balance-Leahy Scale: Poor Standing balance comment: requires  UE support                             Pertinent Vitals/Pain Pain Assessment: No/denies pain    Home Living Family/patient expects to be discharged to:: Private residence Living Arrangements: Spouse/significant other Available Help at Discharge: Family Type of Home: Independent living facility Home Access: Level entry       Home Layout: One level Home Equipment: Shower seat - built in;Grab bars - tub/shower;Cane - single point      Prior Function Prior Level of Function : Independent/Modified Independent             Mobility Comments: SPC intermittently for household distances, walks around indep living facility complex ADLs Comments: Indep for ADLs     Hand Dominance   Dominant Hand: Right    Extremity/Trunk Assessment   Upper Extremity Assessment Upper Extremity Assessment: Generalized weakness;LUE deficits/detail LUE Deficits / Details: chronic n/t L finger tips LUE: Unable to fully assess due to immobilization    Lower Extremity Assessment Lower Extremity Assessment: Generalized weakness    Cervical / Trunk Assessment Cervical / Trunk Assessment: Kyphotic  Communication   Communication: No difficulties  Cognition Arousal/Alertness: Awake/alert Behavior During Therapy: WFL for tasks assessed/performed Overall Cognitive Status: Within Functional Limits for tasks assessed                                 General Comments: tangential w/ conversation, oriented x 4 and overall pleasant and cooperative        General Comments General comments (skin integrity, edema, etc.): L flank ecchymosis from fall    Exercises Other Exercises Other Exercises: Pt edu: answered all pt questions regarding PLOC, modifying current AD and rountine Other Exercises: Sup<>sit, static sitting EOB, attempted sit<>stand x3, could not acheive, hand off to PT at end of session   Assessment/Plan    PT Assessment Patient needs continued PT services  PT  Problem List Decreased strength;Decreased range of motion;Decreased activity tolerance;Decreased balance;Decreased mobility       PT Treatment Interventions Gait training;Stair training;Functional mobility training;Therapeutic activities;Therapeutic exercise;Balance training;Neuromuscular re-education    PT Goals (Current goals can be found in the Care Plan section)  Acute Rehab PT Goals Patient Stated Goal: to go home PT Goal Formulation: With patient Time For Goal Achievement: 02/27/21 Potential to Achieve Goals: Good    Frequency 7X/week   Barriers to discharge        Co-evaluation               AM-PAC PT "6 Clicks" Mobility  Outcome Measure Help needed turning from your back to your side while in a flat bed without using bedrails?: A Lot Help needed moving from lying on your back to sitting on the side of a flat bed without using bedrails?: A Lot Help needed moving to and from a bed to a chair (including a wheelchair)?: A Little Help needed standing up from a chair using your arms (e.g., wheelchair or bedside chair)?: A Little Help needed to walk in hospital room?: A Lot Help needed climbing 3-5 steps with a railing? : A Lot 6 Click Score: 14    End of Session Equipment Utilized During Treatment: Gait belt Activity Tolerance: Patient tolerated treatment well Patient left: in bed;with bed alarm set;with call bell/phone within  reach;with family/visitor present Nurse Communication: Mobility status PT Visit Diagnosis: Other abnormalities of gait and mobility (R26.89);Muscle weakness (generalized) (M62.81);Difficulty in walking, not elsewhere classified (R26.2)    Time: 4401-0272 PT Time Calculation (min) (ACUTE ONLY): 44 min   Charges:              [employer], SPT

## 2021-02-13 NOTE — Progress Notes (Signed)
Occupational Therapy Evaluation Patient Details Name: Brandon Hooper MRN: [id number] DOB: July 10, 1945 Today's Date: 02/13/2021   History of Present Illness Brandon Hooper is a 75 y.o. male with medical history significant of hypertension, lymphedema, venous insufficiency change, umbilical hernia, arthritis, who presents with left arm weakness, facial droop, slurred speech, fall, left shoulder pain. X-ray showed left humeral neck fracture.  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   Clinical Impression   Brandon Hooper was seen for OT evaluation this date. Prior to hospital admission, pt was mostly independent, using a SPC prn. Pt lives with spouse in independent living facility. Currently pt demonstrates impairments as described below (See OT problem list) which functionally limit his ability to perform ADL/self-care tasks. Pt currently requires max A+ 2 to reach EOB. Pt attempted 3x for STS, but could not achieve anterior lean/lift off. Pt would benefit from skilled OT services to address noted impairments and functional limitations (see below for any additional details) in order to maximize safety and independence while minimizing falls risk and caregiver burden. Upon hospital discharge, recommend STR to maximize pt safety and return to PLOF.       Recommendations for follow up therapy are one component of a multi-disciplinary discharge planning process, led by the attending physician.  Recommendations may be updated based on patient status, additional functional criteria and insurance authorization.   Follow Up Recommendations  Skilled nursing-short term rehab (<3 hours/day)    Assistance Recommended at Discharge Frequent or constant Supervision/Assistance  Functional Status Assessment  Patient has had a recent decline in their functional status and demonstrates the ability to make significant improvements in function in a reasonable and predictable amount of time.  Equipment  Recommendations  BSC/3in1    Recommendations for Other Services       Precautions / Restrictions Precautions Precautions: Fall Required Braces or Orthoses: Sling Restrictions Weight Bearing Restrictions: Yes LUE Weight Bearing: Non weight bearing      Mobility Bed Mobility Overal bed mobility: Needs Assistance Bed Mobility: Supine to Sit     Supine to sit: Max assist;+2 for physical assistance     General bed mobility comments: Pt needed physical assist, plus extended time, plus max cueing    Transfers Overall transfer level: Needs assistance Equipment used: Straight cane Transfers: Sit to/from Stand             General transfer comment: Attempted 3x but could not stand, could not achieve anterior lean      Balance Overall balance assessment: Needs assistance Sitting-balance support: Feet supported;No upper extremity supported Sitting balance-Leahy Scale: Fair Sitting balance - Comments: posterior lean upon sitting up right                                   ADL either performed or assessed with clinical judgement   ADL Overall ADL's : Needs assistance/impaired                                              Pertinent Vitals/Pain Pain Assessment: No/denies pain     Hand Dominance Right   Extremity/Trunk Assessment Upper Extremity Assessment Upper Extremity Assessment: LUE deficits/detail (pain/weakness)   Lower Extremity Assessment Lower Extremity Assessment: Generalized weakness       Communication Communication Communication: No difficulties   Cognition Arousal/Alertness:  Awake/alert Behavior During Therapy: WFL for tasks assessed/performed Overall Cognitive Status: Within Functional Limits for tasks assessed                                       General Comments       Exercises Exercises: Other exercises Other Exercises Other Exercises: Pt educ re: OT role, d/c recs, weight bearing  pcns, modifying daily routine, sling adjustments and use Other Exercises: Sup<>sit, static sitting EOB, attempted sit<>stand x3, could not acheive, hand off to PT at end of session   Shoulder Instructions      Home Living Family/patient expects to be discharged to:: Private residence Living Arrangements: Spouse/significant other Available Help at Discharge: Family Type of Home: Independent living facility Home Access: Level entry     Home Layout: One level     Bathroom Shower/Tub: Walk-in shower;Other (comment) (grab bars and shower seat)   Bathroom Toilet: Handicapped height     Home Equipment: Shower seat - built in;Grab bars - tub/shower;Cane - single point          Prior Functioning/Environment Prior Level of Function : Independent/Modified Independent             Mobility Comments: Use SPC on occasion          OT Problem List: Decreased strength;Decreased range of motion;Impaired balance (sitting and/or standing);Decreased coordination;Decreased safety awareness;Decreased knowledge of use of DME or AE;Decreased knowledge of precautions      OT Treatment/Interventions: Self-care/ADL training;Therapeutic exercise;Energy conservation;DME and/or AE instruction;Therapeutic activities    OT Goals(Current goals can be found in the care plan section) Acute Rehab OT Goals Patient Stated Goal: to get better OT Goal Formulation: With patient Time For Goal Achievement: 02/27/21 Potential to Achieve Goals: Good  OT Frequency: Min 5X/week   Barriers to D/C:            Co-evaluation              AM-PAC OT "6 Clicks" Daily Activity     Outcome Measure Help from another person eating meals?: None Help from another person taking care of personal grooming?: A Little Help from another person toileting, which includes using toliet, bedpan, or urinal?: A Lot Help from another person bathing (including washing, rinsing, drying)?: A Little Help from another person  to put on and taking off regular upper body clothing?: A Lot Help from another person to put on and taking off regular lower body clothing?: A Lot 6 Click Score: 16   End of Session Equipment Utilized During Treatment: Other (comment) (Sling, single pt cane)  Activity Tolerance: Patient tolerated treatment well Patient left: in bed;Other (comment) (hand off to PT at end of session)  OT Visit Diagnosis: Unsteadiness on feet (R26.81);Other abnormalities of gait and mobility (R26.89);Muscle weakness (generalized) (M62.81)                Time: 6295-2841 OT Time Calculation (min): 24 min Charges:  OT General Charges $OT Visit: 1 Visit OT Evaluation $OT Eval Moderate Complexity: 1 Mod OT Treatments $Therapeutic Activity: 8-22 mins  [facility], Adine Madura 02/13/2021, 11:54 AM

## 2021-02-14 DIAGNOSIS — I10 Essential (primary) hypertension: Secondary | ICD-10-CM | POA: Diagnosis not present

## 2021-02-14 DIAGNOSIS — S42212A Unspecified displaced fracture of surgical neck of left humerus, initial encounter for closed fracture: Secondary | ICD-10-CM | POA: Diagnosis not present

## 2021-02-14 DIAGNOSIS — I639 Cerebral infarction, unspecified: Secondary | ICD-10-CM | POA: Diagnosis not present

## 2021-02-14 LAB — BASIC METABOLIC PANEL
Anion gap: 8 (ref 5–15)
BUN: 10 mg/dL (ref 8–23)
CO2: 25 mmol/L (ref 22–32)
Calcium: 8.5 mg/dL — ABNORMAL LOW (ref 8.9–10.3)
Chloride: 100 mmol/L (ref 98–111)
Creatinine, Ser: 0.75 mg/dL (ref 0.61–1.24)
GFR, Estimated: >60 mL/min (ref >60)
Glucose, Bld: 112 mg/dL — ABNORMAL HIGH (ref 70–99)
Potassium: 3.6 mmol/L (ref 3.5–5.1)
Sodium: 133 mmol/L — ABNORMAL LOW (ref 135–145)

## 2021-02-14 LAB — CBC WITH DIFFERENTIAL/PLATELET
Abs Immature Granulocytes: 0.04 10*3/uL (ref 0.00–0.07)
Basophils Absolute: 0.1 10*3/uL (ref 0.0–0.1)
Basophils Relative: 1 %
Eosinophils Absolute: 0.1 10*3/uL (ref 0.0–0.5)
Eosinophils Relative: 1 %
HCT: 39.2 % (ref 39.0–52.0)
Hemoglobin: 13 g/dL (ref 13.0–17.0)
Immature Granulocytes: 0 %
Lymphocytes Relative: 19 %
Lymphs Abs: 2.1 10*3/uL (ref 0.7–4.0)
MCH: 30.9 pg (ref 26.0–34.0)
MCHC: 33.2 g/dL (ref 30.0–36.0)
MCV: 93.1 fL (ref 80.0–100.0)
Monocytes Absolute: 1.2 10*3/uL — ABNORMAL HIGH (ref 0.1–1.0)
Monocytes Relative: 11 %
Neutro Abs: 7.6 10*3/uL (ref 1.7–7.7)
Neutrophils Relative %: 68 %
Platelets: 200 10*3/uL (ref 150–400)
RBC: 4.21 MIL/uL — ABNORMAL LOW (ref 4.22–5.81)
RDW: 14.5 % (ref 11.5–15.5)
WBC: 11.1 10*3/uL — ABNORMAL HIGH (ref 4.0–10.5)
nRBC: 0 % (ref 0.0–0.2)

## 2021-02-14 LAB — MAGNESIUM: Magnesium: 2 mg/dL (ref 1.7–2.4)

## 2021-02-14 MED ORDER — ASPIRIN 81 MG PO CHEW: 81.0000 mg | CHEWABLE_TABLET | Freq: Every day | ORAL | 0 refills | Status: DC

## 2021-02-14 MED ORDER — OXYCODONE-ACETAMINOPHEN 5-325 MG PO TABS: 1.0000 | ORAL_TABLET | ORAL | 0 refills | Status: DC | PRN

## 2021-02-14 MED ORDER — ATORVASTATIN CALCIUM 40 MG PO TABS: 40.0000 mg | ORAL_TABLET | Freq: Every day | ORAL | 0 refills | Status: DC

## 2021-02-14 NOTE — [place] FL2 (Signed)
[place] MEDICAID FL2 LEVEL OF CARE SCREENING TOOL     IDENTIFICATION  Patient Name: Brandon Hooper Birthdate: [birth year] Sex: male Admission Date (Current Location): 02/12/2021  [facility] and [place] Number:  Chiropodist and Address:  [facility], [address], [place], [place] [address]      Provider Number: [id number]  Attending Physician Name and Address:  Marrion Coy, MD  Relative Name and Phone Number:  Juanya Villavicencio (spouse) [phone]    Current Level of Care: Hospital Recommended Level of Care: Skilled Nursing Facility Prior Approval Number:    Date Approved/Denied:   PASRR Number: [id number]  Discharge Plan: SNF    Current Diagnoses: Patient Active Problem List   Diagnosis Date Noted   Acute stroke due to ischemia (HCC) 02/13/2021   Stroke (HCC) 02/12/2021   Fall at home, initial encounter 02/12/2021   Hypokalemia 02/12/2021   Leukocytosis 02/12/2021   Fx humeral neck, left, closed, initial encounter    Lymphedema 05/20/2018   Venous ulcer of ankle, left (HCC) 02/11/2018   Venous insufficiency 12/24/2017   Essential hypertension 12/24/2017   Umbilical hernia without obstruction and without gangrene 12/24/2017    Orientation RESPIRATION BLADDER Height & Weight     Self, Time, Situation, Place  Normal Continent Weight: 107.3 kg Height:  6' (182.9 cm)  BEHAVIORAL SYMPTOMS/MOOD NEUROLOGICAL BOWEL NUTRITION STATUS      Continent Diet (see discharge summary)  AMBULATORY STATUS COMMUNICATION OF NEEDS Skin   Extensive Assist Verbally Bruising, Other (Comment) (left arm sling- bruising)                       Personal Care Assistance Level of Assistance  Bathing, Feeding, Dressing Bathing Assistance: Maximum assistance Feeding assistance: Limited assistance Dressing Assistance: Maximum assistance     Functional Limitations Info  Sight, Hearing, Speech Sight Info: Impaired (glasses) Hearing Info:  Adequate Speech Info: Adequate    SPECIAL CARE FACTORS FREQUENCY  PT (By licensed PT), OT (By licensed OT)     PT Frequency: 5 times per week OT Frequency: 5 times per week            Contractures Contractures Info: Not present    Additional Factors Info  Code Status, Allergies Code Status Info: Full Allergies Info: Erythromycin Not Specified Hives   Ofloxacin Not Specified  Other reaction(s): Other (See Comments) "Arthritic pain". The patient reported 5 years of left hip pain when he last took ofloxacin. Likely could try another fluoroquinolone.   Cephalexin Low Rash The patient previously tolerated cephalexin in 2013 and reported in 2019 that he developed redness and itching of his knees while on cephalexin. Not entirely clear it was a true allergic reaction.   Clindamycin Hcl Low Rash   Doxycycline Calcium Low Rash   Sulfamethoxazole-trimethoprim Low Rash   Tetracycline Low Rash           Current Medications (02/14/2021):  This is the current hospital active medication list Current Facility-Administered Medications  Medication Dose Route Frequency Provider Last Rate Last Admin   acetaminophen (TYLENOL) tablet 650 mg  650 mg Oral Once Lorretta Harp, MD       acetaminophen (TYLENOL) tablet 650 mg  650 mg Oral Q6H PRN Lorretta Harp, MD       aspirin chewable tablet 81 mg  81 mg Oral Daily Mila Merry A, RPH   81 mg at 02/14/21 1209   atorvastatin (LIPITOR) tablet 40 mg  40 mg Oral Daily Lorretta Harp,  MD   40 mg at 02/14/21 1209   enoxaparin (LOVENOX) injection 40 mg  40 mg Subcutaneous Q24H Marrion Coy, MD   40 mg at 02/13/21 2213   hydrALAZINE (APRESOLINE) injection 5 mg  5 mg Intravenous Q2H PRN Lorretta Harp, MD       HYDROcodone-acetaminophen (NORCO/VICODIN) 5-325 MG per tablet 1 tablet  1 tablet Oral Once Lorretta Harp, MD       methocarbamol (ROBAXIN) tablet 500 mg  500 mg Oral Q8H PRN Lorretta Harp, MD       ondansetron [facility]) injection 4 mg  4 mg Intravenous Q8H PRN Lorretta Harp, MD   4  mg at 02/13/21 2080   oxyCODONE-acetaminophen (PERCOCET/ROXICET) 5-325 MG per tablet 1 tablet  1 tablet Oral Q4H PRN Lorretta Harp, MD       senna-docusate (Senokot-S) tablet 1 tablet  1 tablet Oral QHS PRN Lorretta Harp, MD         Discharge Medications: Please see discharge summary for a list of discharge medications.  Relevant Imaging Results:  Relevant Lab Results:   Additional Information SS# [id number]  Allayne Butcher, RN

## 2021-02-14 NOTE — TOC Progression Note (Signed)
Transition of Care [facility]) - Progression Note    Patient Details  Name: Brandon Hooper MRN: [id number] Date of Birth: [birth year]  [facility]) CM/SW Contact  Allayne Butcher, RN Phone Number: 02/14/2021, 1:55 PM  Clinical Narrative:    [facility] offered a bed- RNCM starting insurance authorization.    Expected Discharge Plan: Skilled Nursing Facility Barriers to Discharge: No SNF bed  Expected Discharge Plan and Services Expected Discharge Plan: Skilled Nursing Facility   Discharge Planning Services: CM Consult Post Acute Care Choice: Skilled Nursing Facility Living arrangements for the past 2 months: Apartment Expected Discharge Date: 02/14/21               DME Arranged: N/A DME Agency: NA       HH Arranged: NA HH Agency: NA         Social Determinants of Health (SDOH) Interventions    Readmission Risk Interventions No flowsheet data found.

## 2021-02-14 NOTE — Progress Notes (Signed)
Discharge summary performed, but needed stent placement.  We will cancel discharge.  Discussed with Child psychotherapist.

## 2021-02-14 NOTE — Progress Notes (Signed)
Occupational Therapy Treatment Patient Details Name: Brandon Hooper MRN: [id number] DOB: 1945/07/03 Today's Date: 02/14/2021   History of present illness Pt is a 75 y.o. male with medical history significant of hypertension, lymphedema, venous insufficiency change, umbilical hernia, arthritis, who presents with left arm weakness, facial droop, slurred speech, fall, left shoulder pain. X-ray showed left humeral neck fracture.  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   OT comments  Upon entering the room, pt seated in recliner chair and wife present in room. Pt has been sitting up for several hours and requesting to return to bed to rest. OT adjusting L UE sling for support once seated on edge of chair. Mod lifting assistance from recliner chair and use of quad cane to transfer with min A for standing balance. Pt taking very small shuffling steps with min cuing for technique needed. Controlled sit onto EOB with mod A for sit >supine. All needs within reach and pt resting comfortability. Pt making progress towards occupational therapy goals. OT continues to recommend short term rehab stay to address functional deficits.    Recommendations for follow up therapy are one component of a multi-disciplinary discharge planning process, led by the attending physician.  Recommendations may be updated based on patient status, additional functional criteria and insurance authorization.    Follow Up Recommendations  Skilled nursing-short term rehab (<3 hours/day)    Assistance Recommended at Discharge Frequent or constant Supervision/Assistance  Equipment Recommendations  BSC/3in1       Precautions / Restrictions Precautions Precautions: Fall Required Braces or Orthoses: Sling Restrictions Weight Bearing Restrictions: Yes LUE Weight Bearing: Weight bearing as tolerated       Mobility Bed Mobility Overal bed mobility: Needs Assistance Bed Mobility: Sit to Supine     Supine to  sit: Mod assist;HOB elevated Sit to supine: Mod assist   General bed mobility comments: mod A for trunk and LEs    Transfers Overall transfer level: Needs assistance Equipment used: Quad cane Transfers: Sit to/from Stand;Bed to chair/wheelchair/BSC Sit to Stand: Mod assist Stand pivot transfers: Min assist         General transfer comment: mod A to stand from lower recliner chair. Min A transfer with quad cane to bed for safety.     Balance Overall balance assessment: Needs assistance Sitting-balance support: Feet supported;Single extremity supported Sitting balance-Leahy Scale: Fair Sitting balance - Comments: posterior lean upon sitting up right   Standing balance support: Single extremity supported;During functional activity Standing balance-Leahy Scale: Fair Standing balance comment: requires UE support                           ADL either performed or assessed with clinical judgement    Extremity/Trunk Assessment Upper Extremity Assessment Upper Extremity Assessment: Generalized weakness;LUE deficits/detail   Lower Extremity Assessment Lower Extremity Assessment: Generalized weakness        Vision Patient Visual Report: No change from baseline            Cognition Arousal/Alertness: Awake/alert Behavior During Therapy: WFL for tasks assessed/performed Overall Cognitive Status: Within Functional Limits for tasks assessed                                 General Comments: decreased attention requiring redirection of task. Pt is pleasant and cooperative.  Pertinent Vitals/ Pain       Pain Assessment: No/denies pain Pain Score: 6  Pain Location: L shoulder Pain Descriptors / Indicators: Sore;Discomfort;Grimacing Pain Intervention(s): Limited activity within patient's tolerance;Monitored during session;Repositioned         Frequency  Min 5X/week        Progress Toward Goals  OT Goals(current  goals can now be found in the care plan section)  Progress towards OT goals: Progressing toward goals  Acute Rehab OT Goals Patient Stated Goal: to get better OT Goal Formulation: With patient/family Time For Goal Achievement: 02/27/21 Potential to Achieve Goals: Good  Plan Discharge plan remains appropriate;Frequency remains appropriate       AM-PAC OT "6 Clicks" Daily Activity     Outcome Measure   Help from another person eating meals?: None Help from another person taking care of personal grooming?: A Little Help from another person toileting, which includes using toliet, bedpan, or urinal?: A Lot Help from another person bathing (including washing, rinsing, drying)?: A Little Help from another person to put on and taking off regular upper body clothing?: A Lot Help from another person to put on and taking off regular lower body clothing?: A Lot 6 Click Score: 16    End of Session Equipment Utilized During Treatment: Other (comment) (quad cane)  OT Visit Diagnosis: Unsteadiness on feet (R26.81);Other abnormalities of gait and mobility (R26.89);Muscle weakness (generalized) (M62.81)   Activity Tolerance Patient tolerated treatment well   Patient Left in bed;with call bell/phone within reach;with bed alarm set;with family/visitor present   Nurse Communication Mobility status        Time: 1740-8144 OT Time Calculation (min): 14 min  Charges: OT General Charges $OT Visit: 1 Visit OT Treatments $Therapeutic Activity: 8-22 mins  Jackquline Denmark, MS, OTR/L , CBIS ascom [phone]  02/14/21, 4:01 PM

## 2021-02-14 NOTE — Progress Notes (Signed)
Physical Therapy Treatment Patient Details Name: Brandon Hooper MRN: [id number] DOB: 02/22/1946 Today's Date: 02/14/2021   History of Present Illness Pt is a 75 y.o. male with medical history significant of hypertension, lymphedema, venous insufficiency change, umbilical hernia, arthritis, who presents with left arm weakness, facial droop, slurred speech, fall, left shoulder pain. X-ray showed left humeral neck fracture.  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    PT Comments    Pt alert, agreeable to therapy, spouse present throughout treatment providing encouragement. Pt noted nausea in bed, but decreases with upright positioning. Pt required MOD A for bed mobility w/ extensive cues. Pt performed several STS progressing from mod - min w/ momentum and appropriate forward lean. Pt ambulates without LOB w/ quad cane but slow and easily distracted. Pt left in recliner with all needs in reach. SNF remains primary discharge recommendation. Skilled PT intervention is indicated to address deficits in function, mobility, and to return to PLOF as able.     Recommendations for follow up therapy are one component of a multi-disciplinary discharge planning process, led by the attending physician.  Recommendations may be updated based on patient status, additional functional criteria and insurance authorization.  Follow Up Recommendations  Skilled nursing-short term rehab (<3 hours/day)     Assistance Recommended at Discharge Intermittent Supervision/Assistance  Equipment Recommendations  None recommended by PT    Recommendations for Other Services       Precautions / Restrictions Precautions Precautions: Fall Required Braces or Orthoses: Sling Restrictions Weight Bearing Restrictions: Yes LUE Weight Bearing: Weight bearing as tolerated     Mobility  Bed Mobility Overal bed mobility: Needs Assistance Bed Mobility: Sit to Supine     Supine to sit: Mod assist;HOB  elevated          Transfers Overall transfer level: Needs assistance Equipment used: Quad cane Transfers: Sit to/from Stand;Bed to chair/wheelchair/BSC Sit to Stand: Mod assist           General transfer comment: x 3 STS for pt clean up d/t BM, extensive cuing for maintaining fwd weight shift MOD A x2 w/ progression to MIN A x 1;    Ambulation/Gait   Gait Distance (Feet): 5 Feet Assistive device: Quad cane Gait Pattern/deviations: Step-to pattern;Trunk flexed;Wide base of support Gait velocity: decreased     General Gait Details: stepped forward, backward and to chair w/ very slow steps, easily distracted requiring throughout   Stairs             Wheelchair Mobility    Modified Rankin (Stroke Patients Only)       Balance Overall balance assessment: Needs assistance Sitting-balance support: Feet supported;Single extremity supported Sitting balance-Leahy Scale: Fair     Standing balance support: Single extremity supported;During functional activity Standing balance-Leahy Scale: Poor Standing balance comment: requires UE support                            Cognition Arousal/Alertness: Awake/alert Behavior During Therapy: WFL for tasks assessed/performed Overall Cognitive Status: Within Functional Limits for tasks assessed                                 General Comments: decreased attention requiring redirection of task        Exercises      General Comments        Pertinent Vitals/Pain Pain Assessment: 0-10 Pain Score:  6  Pain Location: L shoulder Pain Descriptors / Indicators: Sore;Discomfort;Grimacing Pain Intervention(s): Limited activity within patient's tolerance;Monitored during session;Repositioned    Home Living                          Prior Function            PT Goals (current goals can now be found in the care plan section) Progress towards PT goals: Progressing toward goals     Frequency    7X/week      PT Plan Current plan remains appropriate    Co-evaluation              AM-PAC PT "6 Clicks" Mobility   Outcome Measure  Help needed turning from your back to your side while in a flat bed without using bedrails?: A Little Help needed moving from lying on your back to sitting on the side of a flat bed without using bedrails?: A Lot Help needed moving to and from a bed to a chair (including a wheelchair)?: A Little Help needed standing up from a chair using your arms (e.g., wheelchair or bedside chair)?: A Little Help needed to walk in hospital room?: A Lot Help needed climbing 3-5 steps with a railing? : A Lot 6 Click Score: 15    End of Session Equipment Utilized During Treatment: Gait belt Activity Tolerance: Patient tolerated treatment well Patient left: with call bell/phone within reach;with family/visitor present;in chair;with chair alarm set Nurse Communication: Mobility status PT Visit Diagnosis: Other abnormalities of gait and mobility (R26.89);Muscle weakness (generalized) (M62.81);Difficulty in walking, not elsewhere classified (R26.2)     Time: 1572-6203 PT Time Calculation (min) (ACUTE ONLY): 40 min  Charges:                          [employer], SPT

## 2021-02-14 NOTE — TOC Initial Note (Signed)
Transition of Care [facility]) - Initial/Assessment Note    Patient Details  Name: Brandon Hooper MRN: [id number] Date of Birth: October 18, 1945  Transition of Care [facility]) CM/SW Contact:    Shelbie Hutching, RN Phone Number: 02/14/2021, 12:18 PM  Clinical Narrative:                 Patient admitted to the hospital after a fall at home, he has a humeral neck fracture that does not need surgery, left arm in sling.   RNCM met with patient and patient's wife Mardene Celeste in the room.  Patient is from home with wife, they live at [facility] independent living.  Patient is independent at home and uses a cane when he leaves the house.  Patient can drive but wife is the primary driver.   PT is recommending SNF and patient and wife both agree with recommendation for rehab.  They choose [facility] if the can offer a bed.  Bed request sent to [facility]    Expected Discharge Plan: Skilled Nursing Facility Barriers to Discharge: No SNF bed   Patient Goals and CMS Choice Patient states their goals for this hospitalization and ongoing recovery are:: Patient wants to be able to walk like he was before CMS Medicare.gov Compare Post Acute Care list provided to:: Patient Choice offered to / list presented to : Patient, Spouse  Expected Discharge Plan and Services Expected Discharge Plan: [place]   Discharge Planning Services: CM Consult Post Acute Care Choice: [place] Living arrangements for the past 2 months: Apartment Expected Discharge Date: 02/14/21               DME Arranged: N/A DME Agency: NA       HH Arranged: NA HH Agency: NA        Prior Living Arrangements/Services Living arrangements for the past 2 months: Apartment Lives with:: Spouse Patient language and need for interpreter reviewed:: Yes Do you feel safe going back to the place where you live?: Yes      Need for Family Participation in Patient Care: Yes (Comment) (broken Humerus) Care giver support  system in place?: Yes (comment) (wife) Current home services: DME (single point cane) Criminal Activity/Legal Involvement Pertinent to Current Situation/Hospitalization: No - Comment as needed  Activities of Daily Living Home Assistive Devices/Equipment: None ADL Screening (condition at time of admission) Patient's cognitive ability adequate to safely complete daily activities?: Yes Is the patient deaf or have difficulty hearing?: No Does the patient have difficulty seeing, even when wearing glasses/contacts?: No Does the patient have difficulty concentrating, remembering, or making decisions?: No Patient able to express need for assistance with ADLs?: Yes Does the patient have difficulty dressing or bathing?: Yes Independently performs ADLs?: No Communication: Independent Dressing (OT): Needs assistance Is this a change from baseline?: Change from baseline, expected to last >3 days Grooming: Needs assistance Is this a change from baseline?: Change from baseline, expected to last >3 days Feeding: Needs assistance Is this a change from baseline?: Change from baseline, expected to last >3 days Bathing: Needs assistance Is this a change from baseline?: Change from baseline, expected to last >3 days Toileting: Needs assistance Is this a change from baseline?: Change from baseline, expected to last >3days In/Out Bed: Needs assistance Is this a change from baseline?: Change from baseline, expected to last >3 days Walks in Home: Independent Does the patient have difficulty walking or climbing stairs?: No Weakness of Legs: None Weakness of Arms/Hands: Left  Permission Sought/Granted Permission sought to share information with : Case Freight forwarder, Customer service manager, Family Supports Permission granted to share information with : Yes, Verbal Permission Granted  Share Information with NAME: Mardene Celeste Berton  Permission granted to share info w AGENCY: [facility]  granted to share info w Relationship: wife  Permission granted to share info w Contact Information: [phone]  Emotional Assessment Appearance:: Appears stated age Attitude/Demeanor/Rapport: Engaged Affect (typically observed): Accepting Orientation: : Oriented to Self, Oriented to Place, Oriented to Situation, Oriented to  Time Alcohol / Substance Use: Not Applicable Psych Involvement: No (comment)  Admission diagnosis:  TIA (transient ischemic attack) [G45.9] Hypokalemia [E87.6] Slurred speech [R47.81] Pain [R52] Stroke [facility]) [I63.9] Fall, initial encounter [W19.XXXA] Fx humeral neck, left, closed, initial encounter [S42.212A] Acute stroke due to ischemia [facility]) [I63.9] Patient Active Problem List   Diagnosis Date Noted   Acute stroke due to ischemia ([place]) 02/13/2021   Stroke ([place]) 02/12/2021   Fall at home, initial encounter 02/12/2021   Hypokalemia 02/12/2021   Leukocytosis 02/12/2021   Fx humeral neck, left, closed, initial encounter    Lymphedema 05/20/2018   Venous ulcer of ankle, left ([place]) 02/11/2018   Venous insufficiency 12/24/2017   Essential hypertension 33/61/2244   Umbilical hernia without obstruction and without gangrene 12/24/2017   PCP:  Derinda Late, MD Pharmacy:   [id number] [address] [place] [address] Phone: [phone] Fax: [phone]     Social Determinants of Health (SDOH) Interventions    Readmission Risk Interventions No flowsheet data found.

## 2021-02-14 NOTE — Discharge Summary (Addendum)
Physician Discharge Summary  Patient ID: Brandon Hooper MRN: [id number] DOB/AGE: 10-25-1945 75 y.o.  Admit date: 02/12/2021 Discharge date: 02/15/21  Admission Diagnoses:  Discharge Diagnoses:  Principal Problem:   Stroke [facility]) Active Problems:   Essential hypertension   Fall at home, initial encounter   Hypokalemia   Leukocytosis   Fx humeral neck, left, closed, initial encounter   Acute stroke due to ischemia [facility])   Discharged Condition: good  Hospital Course:   Brandon Hooper is a 75 y.o. male with medical history significant of hypertension, lymphedema, venous insufficiency change, umbilical hernia, arthritis, who presents with left arm weakness, facial droop, slurred speech, fall, left shoulder pain. X-ray showed left humeral neck fracture.  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    Acute left cerebellar stroke. Nausea vomiting secondary to stroke. Patient has been evaluated by neurology, MRI confirmed stroke, CT angiogram of the neck and head did not show any occlusion.  Echocardiogram showed normal ejection fraction without any evidence of blood clots. Continue aspirin and Lipitor. Patient has significant nausea vomiting, could not tolerate diet.  This is probably secondary to new stroke.  He received 1 day of IV fluids, condition had improved.  Currently he is medically stable to be discharged. Follow-up with PCP and neurology as outpatient.   Essential hypertension Follow-up with PCP in 1 week.  Left humeral neck fracture. Evaluated by orthopedics, no surgery needed.  Follow-up with orthopedics as scheduled   Consults: neurology and orthopedic surgery  Significant Diagnostic Studies:  MRI HEAD WITHOUT CONTRAST   TECHNIQUE: Multiplanar, multiecho pulse sequences of the brain and surrounding structures were obtained without intravenous contrast.   COMPARISON:  Head CT and CTA earlier today.   FINDINGS: Brain: Suboptimal related to  patient spinal kyphosis. Technologist was unable to use dedicated head coil from brain imaging.   Patchy roughly 2 cm area of restricted diffusion in the left superior cerebellar artery territory. See series 15, image 16 and series 13, image 21. Faint T2 and FLAIR hyperintensity in that area. No hemorrhage or mass effect.   Nearby small chronic posterior left cerebellar infarct as seen by CT. No other restricted diffusion.   No midline shift, mass effect, evidence of mass lesion, ventriculomegaly, extra-axial collection or acute intracranial hemorrhage is evident. Cervicomedullary junction and pituitary are within normal limits.   Mild for age patchy bilateral cerebral white matter T2 and FLAIR hyperintensity. Negative deep gray matter nuclei and brainstem.   Vascular: Major intracranial vascular flow voids are preserved.   Skull and upper cervical spine: Visualized bone marrow signal is within normal limits.   Sinuses/Orbits: Postoperative changes to both globes, otherwise negative.   Other: Mastoids are clear.  Negative visible scalp and face.   IMPRESSION: 1. 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 No associated hemorrhage or mass effect. 2. Mildly limited as unable to use dedicated head coil for brain imaging due to patient kyphosis. 3. No other acute intracranial abnormality.     Electronically Signed   By: Odessa Fleming M.D.   On: 02/12/2021 11:52    CT ANGIOGRAPHY HEAD AND NECK   TECHNIQUE: Multidetector CT imaging of the head and neck was performed using the standard protocol during bolus administration of intravenous contrast. Multiplanar CT image reconstructions and MIPs were obtained to evaluate the vascular anatomy. Carotid stenosis measurements (when applicable) are obtained utilizing NASCET criteria, using the distal internal carotid diameter as the denominator.   CONTRAST:  26mL OMNIPAQUE IOHEXOL 350 MG/ML  SOLN   COMPARISON:  Plain head CT 0724 hours today.   FINDINGS: CTA NECK   Skeleton: Exaggerated thoracic kyphosis and reversal of cervical lordosis. Widespread interbody ankylosis in the lower cervical and upper thoracic spine from bulky flowing endplate osteophytes. No acute osseous abnormality identified.   Upper chest: Minor atelectasis, otherwise negative. Central pulmonary arteries appear to be patent.   Other neck: Atrophied bilateral submandibular glands. No acute finding in the neck.   Aortic arch: 3 vessel arch configuration with mild calcified arch atherosclerosis.   Right carotid system: Negative.   Left carotid system: Minimal plaque at the posterior left ICA origin. No stenosis and otherwise negative.   Vertebral arteries: Mild calcified plaque at the right subclavian origin without stenosis. Negative right vertebral artery origin and cervical right vertebral artery.   Minimal calcified plaque at the left subclavian artery origin without stenosis. Negative left vertebral artery origin and cervical left vertebral artery.   CTA HEAD   Posterior circulation: Mildly dominant left V4 segment. Patent distal vertebral arteries to the vertebrobasilar junction without plaque or stenosis. Patent PICA origins. Patent basilar artery without stenosis. Patent SCA and PCA origins. Posterior communicating arteries are diminutive or absent. Bilateral PCA branches are within normal limits.   Anterior circulation: Both ICA siphons are patent. Mild supraclinoid calcified plaque bilaterally with no significant siphon stenosis. Patent carotid termini. Normal MCA and ACA origins. Anterior communicating artery and bilateral ACA branches are within normal limits. Left MCA M1 segment and trifurcation appear patent without stenosis. Right MCA M1 segment and bifurcation appear patent without stenosis. Bilateral MCA branches are within normal limits.   Venous sinuses: Patent.    Anatomic variants: Mildly dominant left vertebral artery.   Review of the MIP images confirms the above findings   IMPRESSION: 1. Negative for large vessel occlusion. 2. Very mild for age atherosclerosis in the head and neck. No significant arterial stenosis. Mild aortic atherosclerosis. 3. 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     Electronically Signed   By: Odessa Fleming M.D.   On: 02/12/2021 09:13     Left ventricular ejection fraction, by estimation, is 60 to 65%. The left ventricle has normal function. The left ventricle has no regional wall motion abnormalities. Left ventricular diastolic parameters are consistent with Grade I diastolic dysfunction (impaired relaxation). 1. 2. Right ventricular systolic function is normal. The right ventricular size is normal. The mitral valve is normal in structure. Trivial mitral valve regurgitation. No evidence of mitral stenosis. 3. The aortic valve is normal in structure. Aortic valve regurgitation is not visualized. Mild aortic valve sclerosis is present, with no evidence of aortic valve stenosis. 4. The inferior vena cava is normal in size with greater than 50% respiratory variability, suggesting right atrial pressure of 3 mmHg.  Treatments: aspirin, lipitor, IVF  Discharge Exam: Blood pressure (!) 147/77, pulse 82, temperature 98.5 F (36.9 C), resp. rate 18, height 6' (1.829 m), weight 107.3 kg, SpO2 97 %. General appearance: alert and cooperative Resp: clear to auscultation bilaterally Cardio: regular rate and rhythm, S1, S2 normal, no murmur, click, rub or gallop GI: soft, non-tender; bowel sounds normal; no masses,  no organomegaly Extremities: extremities normal, atraumatic, no cyanosis or edema  Disposition: Discharge disposition: 01-Home or Self Care       Discharge Instructions     Diet - low  sodium heart healthy  Complete by: As directed    Increase activity slowly   Complete by: As directed       Allergies as of 02/14/2021       Reactions   Erythromycin Hives   Ofloxacin    Other reaction(s): Other (See Comments) "Arthritic pain". The patient reported 5 years of left hip pain when he last took ofloxacin. Likely could try another fluoroquinolone.    Cephalexin Rash   The patient previously tolerated cephalexin in 2013 and reported in 2019 that he developed redness and itching of his knees while on cephalexin. Not entirely clear it was a true allergic reaction.    Clindamycin Hcl Rash   Doxycycline Calcium Rash   Sulfamethoxazole-trimethoprim Rash   Tetracycline Rash        Medication List     STOP taking these medications    lisinopril 10 MG tablet Commonly known as: ZESTRIL       TAKE these medications    aspirin 81 MG chewable tablet Chew 1 tablet (81 mg total) by mouth daily.   atorvastatin 40 MG tablet Commonly known as: LIPITOR Take 1 tablet (40 mg total) by mouth daily.   oxyCODONE-acetaminophen 5-325 MG tablet Commonly known as: PERCOCET/ROXICET Take 1 tablet by mouth every 4 (four) hours as needed for moderate pain.        Follow-up Information     Deeann Saint, MD Follow up.   Specialty: Orthopedic Surgery Why: Please make an appointment for the end of the week for this patient in my office before he is discharged Contact information: [address] [place] [address] [phone]         Kandyce Rud, MD Follow up in 1 week(s).   Specialty: Family Medicine Contact information: [address] [facility] [place] [address] [phone]         [facility] Follow up in 2 week(s).   Contact information: [address] [place] [address] [phone]               32 minutes Signed: Marrion Coy 02/14/2021, 9:29 AM

## 2021-02-15 LAB — RESP PANEL BY RT-PCR (FLU A&B, COVID) ARPGX2
Influenza A by PCR: NEGATIVE
Influenza B by PCR: NEGATIVE
SARS Coronavirus 2 by RT PCR: NEGATIVE

## 2021-02-15 NOTE — TOC Transition Note (Signed)
Transition of Care [facility]) - CM/SW Discharge Note   Patient Details  Name: Brandon Hooper MRN: [id number] Date of Birth: 01-May-1945  Transition of Care [facility]) CM/SW Contact:  Allayne Butcher, RN Phone Number: 02/15/2021, 11:23 AM   Clinical Narrative:    Patient medically cleared for discharge to [facility] today for rehab.  Patient is going to room 303.  RNCM has arranged EMS transport.  Bedside RN to call report.  Wife at the bedside and updated on discharge.    Final next level of care: Skilled Nursing Facility Barriers to Discharge: Barriers Resolved   Patient Goals and CMS Choice Patient states their goals for this hospitalization and ongoing recovery are:: Patient wants to be able to walk like he was before CMS Medicare.gov Compare Post Acute Care list provided to:: Patient Choice offered to / list presented to : Patient, Spouse  Discharge Placement PASRR number recieved: 02/14/21            Patient chooses bed at: [facility] Patient to be transferred to facility by: [facility] Name of family member notified: Maureen Chatters Patient and family notified of of transfer: 02/15/21  Discharge Plan and Services   Discharge Planning Services: CM Consult Post Acute Care Choice: Skilled Nursing Facility          DME Arranged: N/A DME Agency: NA       HH Arranged: NA HH Agency: NA        Social Determinants of Health (SDOH) Interventions     Readmission Risk Interventions No flowsheet data found.

## 2021-02-15 NOTE — Progress Notes (Signed)
Patient seen and examined at the bedside this morning.  Hemodynamically stable.  Alert and oriented.  Denies any new complaints today.  Discharge summary and orders were already placed yesterday.  He has got a bed in skilled nursing facility, patient is medically stable for discharge.  No change to medical management

## 2021-02-15 NOTE — TOC Progression Note (Signed)
Transition of Care [facility]) - Progression Note    Patient Details  Name: Anton Cheramie MRN: [id number] Date of Birth: 1946/01/22  Transition of Care [facility]) CM/SW Contact  Allayne Butcher, RN Phone Number: 02/15/2021, 8:50 AM  Clinical Narrative:    Insurance authorization approved for [employer].  Stanton Kidney given Berkley Harvey information.  Patient will be going to room 303 today.   Expected Discharge Plan: Skilled Nursing Facility Barriers to Discharge: No SNF bed  Expected Discharge Plan and Services Expected Discharge Plan: Skilled Nursing Facility   Discharge Planning Services: CM Consult Post Acute Care Choice: Skilled Nursing Facility Living arrangements for the past 2 months: Apartment Expected Discharge Date: 02/14/21               DME Arranged: N/A DME Agency: NA       HH Arranged: NA HH Agency: NA         Social Determinants of Health (SDOH) Interventions    Readmission Risk Interventions No flowsheet data found.

## 2021-03-22 IMAGING — CR DG SACRUM/COCCYX 2+V
3 series · 3 of 3 positions shown · non-contrast
Comparison: None.

CLINICAL DATA: Fall in late [DATE]. Continued low back pain.

EXAM:
SACRUM AND COCCYX - 2+ VIEW

[sacrum ap]
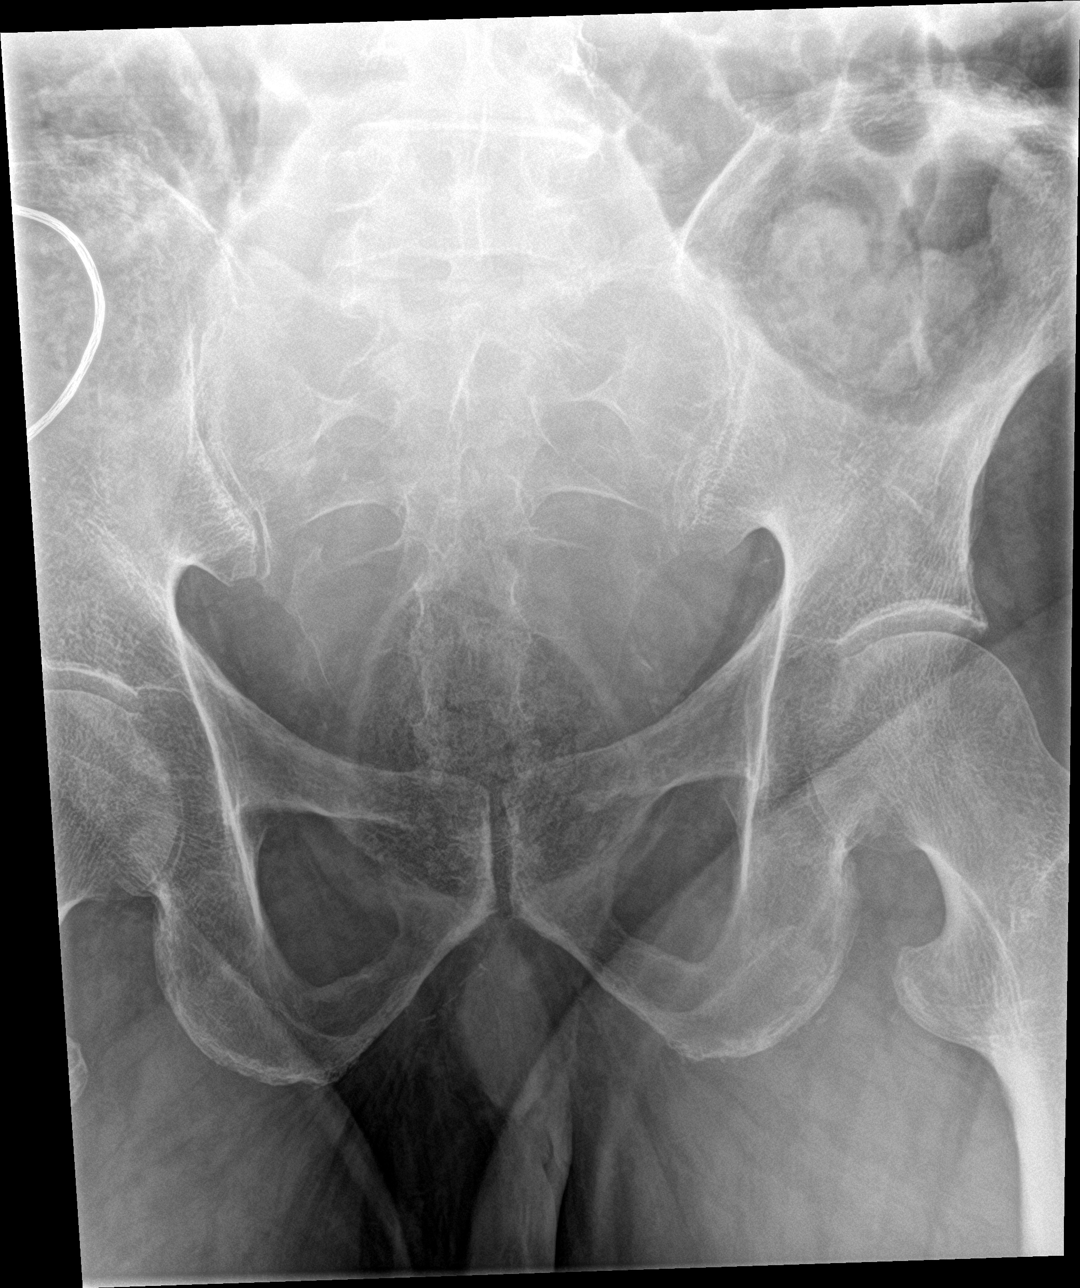

[coccyx ap]
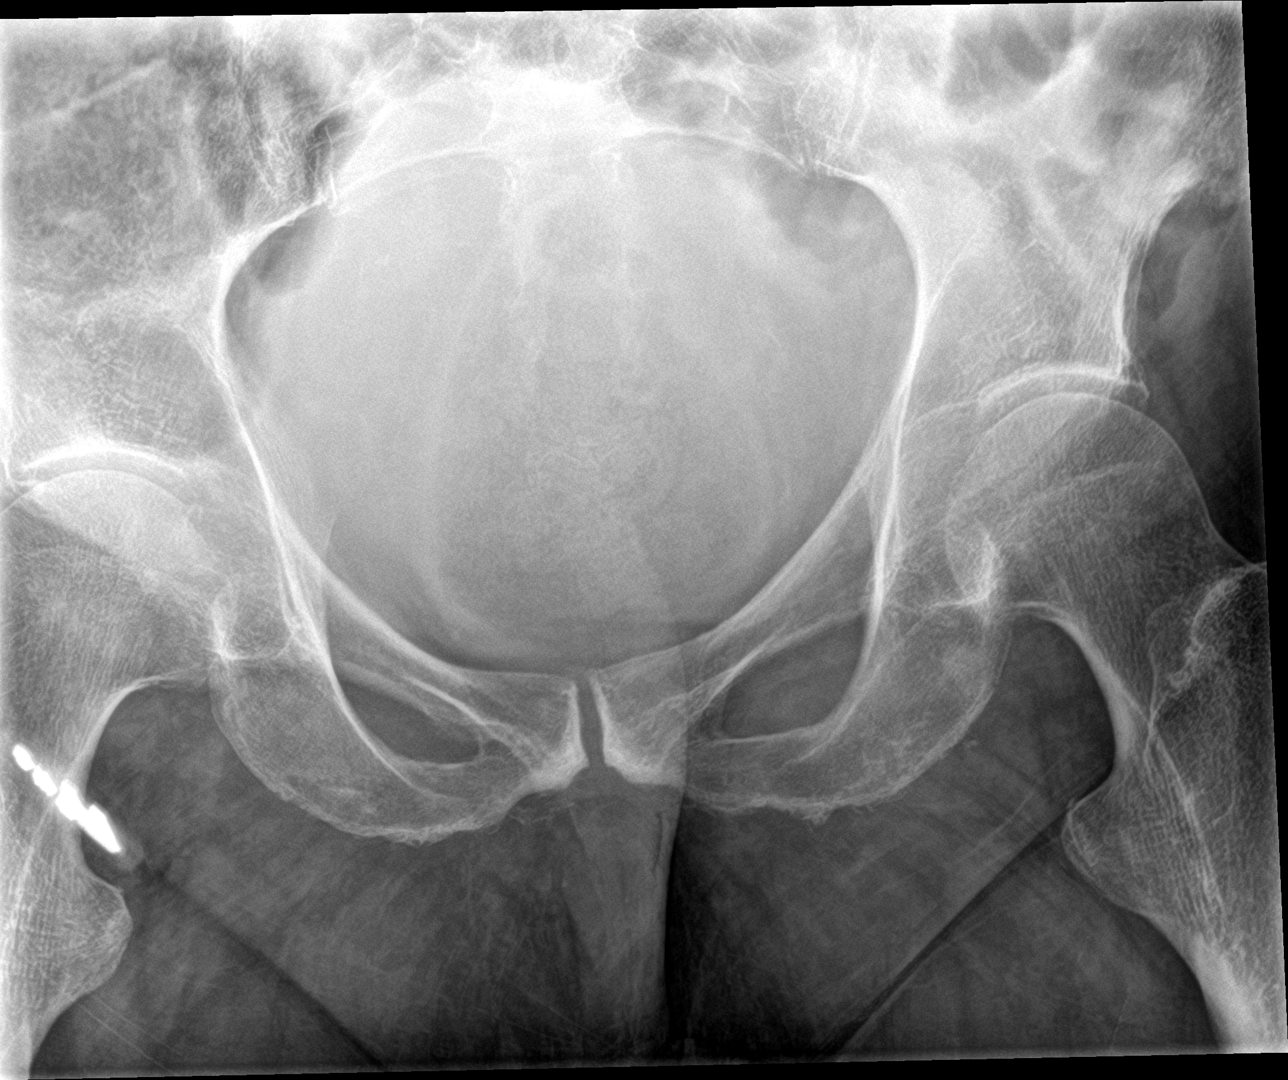

[sacrum lat]
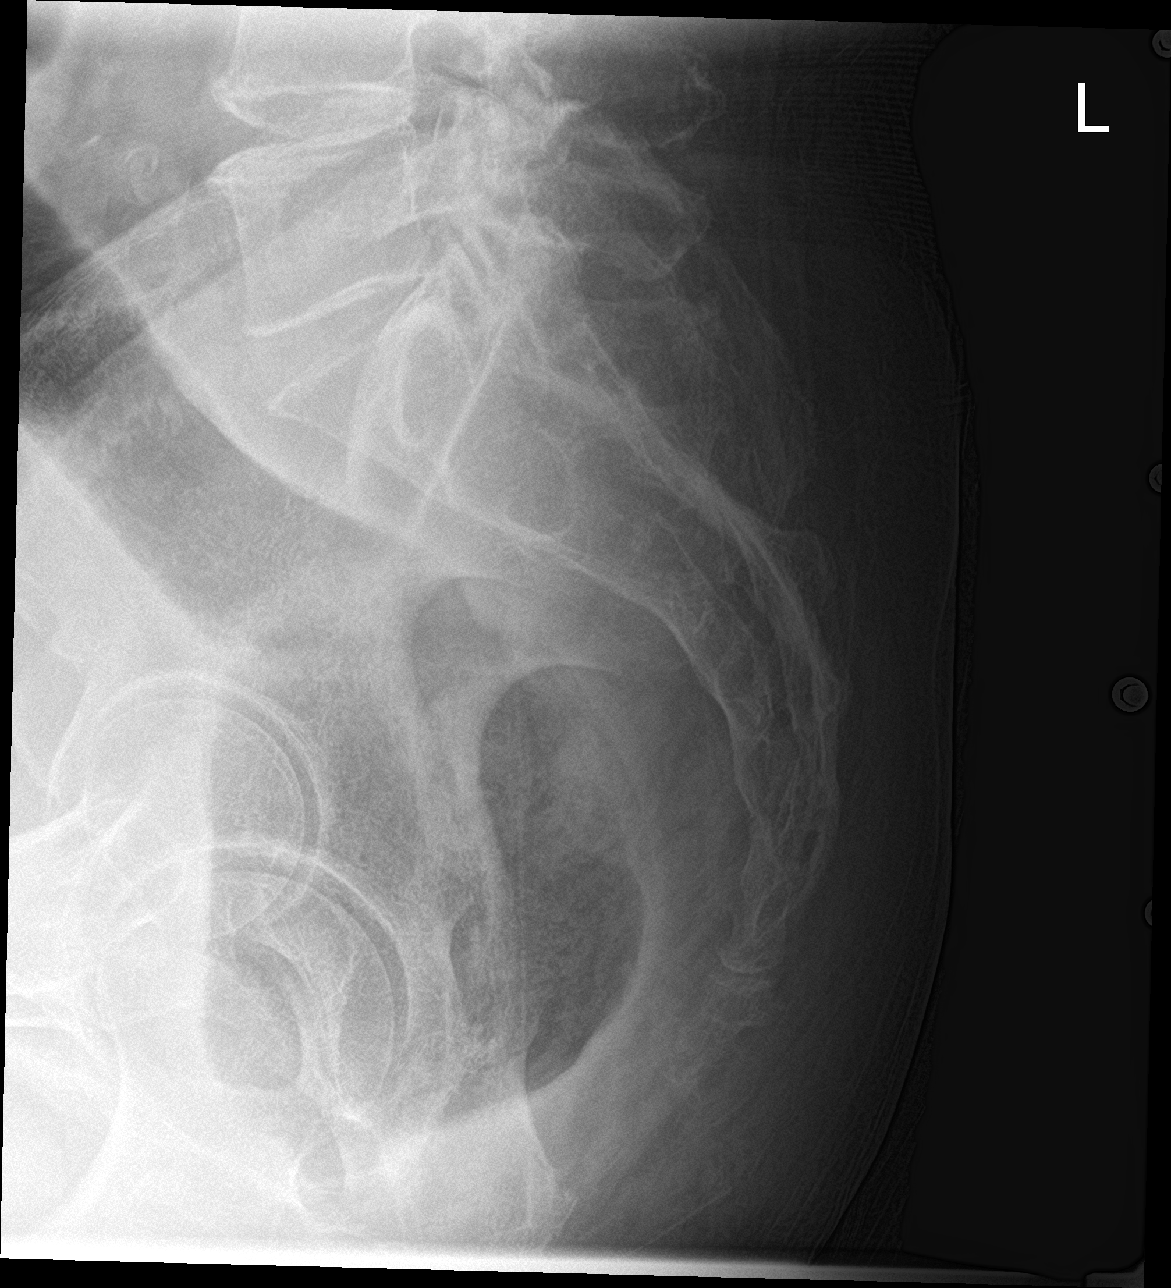

[3 of 3 positions shown; findings below may reference images not displayed]

FINDINGS: No fracture or bone lesion.

SI joints normally spaced and aligned.

Soft tissues are unremarkable.
IMPRESSION: No fracture.

## 2021-04-02 ENCOUNTER — Emergency Department
Admission: EM | Admit: 2021-04-02 | Discharge: 2021-04-02 | Disposition: A | Discharge status: Home / Self Care | Payer: Medicare Other | Attending: Physician Assistant | Admitting: Physician Assistant

## 2021-04-02 ENCOUNTER — Other Ambulatory Visit: Payer: Self-pay

## 2021-04-02 ENCOUNTER — Encounter: Payer: Self-pay | Admitting: Emergency Medicine

## 2021-04-02 DIAGNOSIS — Z7982 Long term (current) use of aspirin: Secondary | ICD-10-CM | POA: Diagnosis not present

## 2021-04-02 DIAGNOSIS — M545 Low back pain, unspecified: Secondary | ICD-10-CM

## 2021-04-02 DIAGNOSIS — Z79899 Other long term (current) drug therapy: Secondary | ICD-10-CM | POA: Insufficient documentation

## 2021-04-02 DIAGNOSIS — I10 Essential (primary) hypertension: Secondary | ICD-10-CM | POA: Diagnosis not present

## 2021-04-02 DIAGNOSIS — M62830 Muscle spasm of back: Secondary | ICD-10-CM | POA: Insufficient documentation

## 2021-04-02 MED ORDER — LIDOCAINE 5 % EX PTCH
1.0000 | MEDICATED_PATCH | Freq: Once | CUTANEOUS | Status: DC
  Administered 2021-04-02: 13:00:00 1 via TRANSDERMAL
  Filled 2021-04-02: qty 1

## 2021-04-02 MED ORDER — LIDOCAINE 5 % EX PTCH: 1.0000 | MEDICATED_PATCH | Freq: Two times a day (BID) | CUTANEOUS | 0 refills | Status: AC | PRN

## 2021-04-02 MED ORDER — CYCLOBENZAPRINE HCL 10 MG PO TABS
10.0000 mg | ORAL_TABLET | Freq: Once | ORAL | Status: AC
  Administered 2021-04-02: 15:00:00 10 mg via ORAL
  Filled 2021-04-02: qty 1

## 2021-04-02 MED ORDER — CYCLOBENZAPRINE HCL 10 MG PO TABS
5.0000 mg | ORAL_TABLET | Freq: Once | ORAL | Status: AC
  Administered 2021-04-02: 13:00:00 5 mg via ORAL
  Filled 2021-04-02: qty 1

## 2021-04-02 MED ORDER — CYCLOBENZAPRINE HCL 5 MG PO TABS: 5.0000 mg | ORAL_TABLET | Freq: Two times a day (BID) | ORAL | 0 refills | Status: AC | PRN

## 2021-04-02 NOTE — Discharge Instructions (Addendum)
Take the muscle relaxant (Flexeril) once or twice daily as needed.  Use the lidocaine pain patches as directed.  Be careful as the muscle relaxant can cause some drowsiness.  Follow-up with primary provider return to the ED if needed.

## 2021-04-02 NOTE — ED Provider Notes (Signed)
[facility] Emergency Department Provider Note ____________________________________________  Time seen: 1124  I have reviewed the triage vital signs and the nursing notes.  HISTORY  Chief Complaint  Back Pain  HPI Brandon Hooper is a 75 y.o. male with the below medical history including chronic back pain, presents to the ED, via EMS, for acute flare of chronic low back pain.  He reports an acute flare of his midline low back pain, last night without any preceding trauma, injury, or fall.  He describes muscle spasm type pain that makes him not want transition or move.  Patient reports that He had a muscle relaxant and feels like that would help with his symptoms.  He has had relief with muscle relaxants over pain medicines in the past.  He has been home from rehab since Nov 26. He had an acute stroke with resultant fall and left shoulder humeral head fracture, treated with immobilization. Since home, he has had twice weekly therapy (PT/OT). He ambulates with a  quad cane. Patient denies any bladder or bowel incontinence, foot drop, or saddle anesthesia. He denies fevers, chills, cough, congestion, NVD.   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  Authorizing Provider  cyclobenzaprine (FLEXERIL) 5 MG tablet Take 1 tablet (5 mg total) by mouth 2 (two) times daily as needed for up to 5 days for muscle spasms. 04/02/21 04/07/21 Yes Alta Shober, Charlesetta Ivory, PA-C  lidocaine (LIDODERM) 5 % Place 1 patch onto the skin every 12 (twelve) hours as needed for up to 10 days. Remove & Discard patch after 12 hours of wear each day. 04/02/21 04/12/21 Yes Shelah Heatley, Charlesetta Ivory, PA-C  aspirin 81 MG chewable tablet Chew 1 tablet (81 mg total) by mouth daily. 02/14/21   Marrion Coy, MD  atorvastatin (LIPITOR) 40 MG tablet Take 1 tablet (40 mg total) by mouth daily. 02/14/21   Marrion Coy, MD  oxyCODONE-acetaminophen (PERCOCET/ROXICET) 5-325 MG tablet Take 1 tablet by mouth every 4 (four) hours as needed for moderate pain. 02/14/21   Marrion Coy, MD    Allergies Erythromycin, Ofloxacin, Cephalexin, Clindamycin hcl, Doxycycline calcium, Sulfamethoxazole-trimethoprim, and Tetracycline  Family History  Problem Relation Age of Onset   Diabetes Father     Social History Social History   Tobacco Use   Smoking status: Never   Smokeless tobacco: Never  Substance Use Topics   Alcohol use: Not Currently    Review of Systems  Constitutional: Negative for fever. Cardiovascular: Negative for chest pain. Respiratory: Negative for shortness of breath. Gastrointestinal: Negative for abdominal pain, vomiting and diarrhea. Genitourinary: Negative for dysuria. Musculoskeletal: Positive for back pain. Skin: Negative for rash. Neurological: Negative for headaches, focal weakness or numbness.  ____________________________________________  PHYSICAL EXAM:  VITAL SIGNS: ED Triage Vitals  Enc Vitals Group     BP 04/02/21 1022 (!) 151/86     Pulse Rate 04/02/21 1022 99     Resp 04/02/21 1022 20     Temp 04/02/21 1022 98.1 F (36.7 C)     Temp Source 04/02/21 1022 Oral     SpO2 04/02/21 1022 96 %     Weight 04/02/21 1023 225 lb (102.1 kg)     Height  04/02/21 1023 6' (1.829 m)     Head Circumference --      Peak Flow --      Pain Score 04/02/21 1022 8     Pain Loc --      Pain Edu? --      Excl. in GC? --     Constitutional: Alert and oriented. Well appearing and in no distress. Head: Normocephalic and atraumatic. Eyes: Conjunctivae are normal. Normal extraocular movements Ears: Canals clear.  Neck: Supple. No thyromegaly. Cardiovascular: Normal rate, regular rhythm. Normal distal pulses. Respiratory: Normal respiratory effort. No wheezes/rales/rhonchi. Gastrointestinal: Soft and nontender. No distention. Musculoskeletal: Normal spinal alignment without significant midline tenderness, spasm, deformity, or step-off.  Patient from a tender palpation over the lumbar sacral junction.  Able demonstrate normal hip flexion and extension range bilaterally.  Nontender with normal range of motion in all extremities.  Neurologic: Cranial nerves II to XII grossly intact.  Normal LE DTRs bilaterally.  Negative supine straight leg raise bilaterally.  Normal gait without ataxia. Normal speech and language. No gross focal neurologic deficits are appreciated. Skin:  Skin is warm, dry and intact. No rash noted. Psychiatric: Mood and affect are normal. Patient exhibits appropriate insight and judgment. ____________________________________________    {LABS (pertinent positives/negatives)  ____________________________________________  {EKG  ____________________________________________   RADIOLOGY Official radiology report(s): No results found. ____________________________________________  PROCEDURES  Flexeril 5 mg PO Lidoderm patch 5% topical  Procedures ____________________________________________   INITIAL IMPRESSION / ASSESSMENT AND PLAN / ED COURSE  As part of my medical decision making, I reviewed the following data within the electronic MEDICAL RECORD NUMBER Notes from prior ED visits   DDX: Lumbar strain, lumbar radiculopathy,  compression fracture, DDD  Geriatric patient ED evaluation of acute on chronic low back pain.  Patient presents in no acute distress without any recent injury, trauma, or fall.  Exam is reassuring as it shows no red flags on exam.  No acute neuromuscular deficits.  Patient describes symptoms as muscle spasms and tightness, limiting his ability and range of motion.  Patient is treated with a single dose of antispasmodic in the ED, and reports significant provement of his symptoms.  Ambulates to the restroom with standby assist with his walker which is baseline.  Patient discharged at this time with a small prescription for muscle relaxants and lidocaine patches.  He will follow with primary provider for ongoing symptoms peer return precautions of been reviewed.  Brandon Hooper was evaluated in Emergency Department on 04/02/2021 for the symptoms described in the history of present illness. He was evaluated in the context of the global COVID-19 pandemic, which necessitated consideration that the patient might be at risk for infection with the SARS-CoV-2 virus that causes COVID-19. Institutional protocols and algorithms that pertain to the evaluation of patients at risk for COVID-19 are in a state of rapid change based on information released by regulatory bodies including the CDC and federal and state organizations. These policies and algorithms were followed during the patient's  care in the ED. ____________________________________________  FINAL CLINICAL IMPRESSION(S) / ED DIAGNOSES  Final diagnoses:  Acute midline low back pain without sciatica  Muscle spasm of back      Lissa Hoard, PA-C 04/02/21 1533    Shaune Pollack, MD 04/02/21 1843

## 2021-04-02 NOTE — ED Triage Notes (Signed)
Pt via EMS from home. Pt c/o chronic back pain, states that it started getting worse last night. No other complaints. Denies any recent injury. Pt is A&Ox4 and NAD.

## 2021-05-06 ENCOUNTER — Encounter: Payer: Self-pay | Admitting: Emergency Medicine

## 2021-05-06 ENCOUNTER — Inpatient Hospital Stay
Admission: EM | Admit: 2021-05-06 | Discharge: 2021-05-09 | DRG: 872 | Disposition: A | Discharge status: Skilled Nursing Facility | Payer: Medicare Other | Attending: Internal Medicine | Admitting: Internal Medicine

## 2021-05-06 ENCOUNTER — Emergency Department: Payer: Medicare Other

## 2021-05-06 ENCOUNTER — Other Ambulatory Visit: Payer: Self-pay

## 2021-05-06 DIAGNOSIS — Z8673 Personal history of transient ischemic attack (TIA), and cerebral infarction without residual deficits: Secondary | ICD-10-CM | POA: Diagnosis not present

## 2021-05-06 DIAGNOSIS — I890 Lymphedema, not elsewhere classified: Secondary | ICD-10-CM | POA: Diagnosis present

## 2021-05-06 DIAGNOSIS — R338 Other retention of urine: Secondary | ICD-10-CM | POA: Diagnosis present

## 2021-05-06 DIAGNOSIS — I10 Essential (primary) hypertension: Secondary | ICD-10-CM

## 2021-05-06 DIAGNOSIS — Z7982 Long term (current) use of aspirin: Secondary | ICD-10-CM | POA: Diagnosis not present

## 2021-05-06 DIAGNOSIS — E785 Hyperlipidemia, unspecified: Secondary | ICD-10-CM | POA: Diagnosis present

## 2021-05-06 DIAGNOSIS — S32010A Wedge compression fracture of first lumbar vertebra, initial encounter for closed fracture: Secondary | ICD-10-CM

## 2021-05-06 DIAGNOSIS — R339 Retention of urine, unspecified: Secondary | ICD-10-CM | POA: Diagnosis not present

## 2021-05-06 DIAGNOSIS — M1990 Unspecified osteoarthritis, unspecified site: Secondary | ICD-10-CM | POA: Diagnosis present

## 2021-05-06 DIAGNOSIS — N401 Enlarged prostate with lower urinary tract symptoms: Secondary | ICD-10-CM | POA: Diagnosis present

## 2021-05-06 DIAGNOSIS — Z20822 Contact with and (suspected) exposure to covid-19: Secondary | ICD-10-CM | POA: Diagnosis present

## 2021-05-06 DIAGNOSIS — L03116 Cellulitis of left lower limb: Secondary | ICD-10-CM

## 2021-05-06 DIAGNOSIS — Z79899 Other long term (current) drug therapy: Secondary | ICD-10-CM | POA: Diagnosis not present

## 2021-05-06 DIAGNOSIS — I639 Cerebral infarction, unspecified: Secondary | ICD-10-CM | POA: Diagnosis not present

## 2021-05-06 DIAGNOSIS — I872 Venous insufficiency (chronic) (peripheral): Secondary | ICD-10-CM | POA: Diagnosis present

## 2021-05-06 DIAGNOSIS — E871 Hypo-osmolality and hyponatremia: Secondary | ICD-10-CM | POA: Diagnosis present

## 2021-05-06 DIAGNOSIS — I110 Hypertensive heart disease with heart failure: Secondary | ICD-10-CM | POA: Diagnosis present

## 2021-05-06 DIAGNOSIS — Z881 Allergy status to other antibiotic agents status: Secondary | ICD-10-CM | POA: Diagnosis not present

## 2021-05-06 DIAGNOSIS — I5032 Chronic diastolic (congestive) heart failure: Secondary | ICD-10-CM

## 2021-05-06 DIAGNOSIS — W19XXXA Unspecified fall, initial encounter: Secondary | ICD-10-CM | POA: Diagnosis present

## 2021-05-06 DIAGNOSIS — E876 Hypokalemia: Secondary | ICD-10-CM | POA: Diagnosis present

## 2021-05-06 DIAGNOSIS — G8929 Other chronic pain: Secondary | ICD-10-CM | POA: Diagnosis present

## 2021-05-06 DIAGNOSIS — A419 Sepsis, unspecified organism: Secondary | ICD-10-CM | POA: Diagnosis present

## 2021-05-06 DIAGNOSIS — R197 Diarrhea, unspecified: Secondary | ICD-10-CM | POA: Diagnosis present

## 2021-05-06 DIAGNOSIS — L03119 Cellulitis of unspecified part of limb: Secondary | ICD-10-CM | POA: Diagnosis present

## 2021-05-06 DIAGNOSIS — R140 Abdominal distension (gaseous): Secondary | ICD-10-CM

## 2021-05-06 DIAGNOSIS — S32019A Unspecified fracture of first lumbar vertebra, initial encounter for closed fracture: Secondary | ICD-10-CM | POA: Diagnosis present

## 2021-05-06 DIAGNOSIS — R600 Localized edema: Secondary | ICD-10-CM

## 2021-05-06 DIAGNOSIS — M545 Low back pain, unspecified: Secondary | ICD-10-CM | POA: Diagnosis not present

## 2021-05-06 DIAGNOSIS — L03115 Cellulitis of right lower limb: Secondary | ICD-10-CM | POA: Diagnosis present

## 2021-05-06 LAB — CBC
HCT: 37.6 % — ABNORMAL LOW (ref 39.0–52.0)
Hemoglobin: 12.3 g/dL — ABNORMAL LOW (ref 13.0–17.0)
MCH: 29.9 pg (ref 26.0–34.0)
MCHC: 32.7 g/dL (ref 30.0–36.0)
MCV: 91.5 fL (ref 80.0–100.0)
Platelets: 306 10*3/uL (ref 150–400)
RBC: 4.11 MIL/uL — ABNORMAL LOW (ref 4.22–5.81)
RDW: 14.5 % (ref 11.5–15.5)
WBC: 12.4 10*3/uL — ABNORMAL HIGH (ref 4.0–10.5)
nRBC: 0 % (ref 0.0–0.2)

## 2021-05-06 LAB — BASIC METABOLIC PANEL
Anion gap: 12 (ref 5–15)
BUN: 14 mg/dL (ref 8–23)
CO2: 23 mmol/L (ref 22–32)
Calcium: 8.6 mg/dL — ABNORMAL LOW (ref 8.9–10.3)
Chloride: 101 mmol/L (ref 98–111)
Creatinine, Ser: 0.81 mg/dL (ref 0.61–1.24)
GFR, Estimated: >60 mL/min (ref >60)
Glucose, Bld: 95 mg/dL (ref 70–99)
Potassium: 3.3 mmol/L — ABNORMAL LOW (ref 3.5–5.1)
Sodium: 136 mmol/L (ref 135–145)

## 2021-05-06 LAB — URINALYSIS, ROUTINE W REFLEX MICROSCOPIC
Bacteria, UA: NONE SEEN
Glucose, UA: NEGATIVE mg/dL
Hgb urine dipstick: NEGATIVE
Ketones, ur: 15 mg/dL — AB
Leukocytes,Ua: NEGATIVE
Nitrite: NEGATIVE
Protein, ur: 100 mg/dL — AB
Specific Gravity, Urine: 1.02 (ref 1.005–1.030)
Squamous Epithelial / HPF: NONE SEEN (ref 0–5)
pH: 5.5 (ref 5.0–8.0)

## 2021-05-06 LAB — HEPATIC FUNCTION PANEL
ALT: 22 U/L (ref 0–44)
AST: 68 U/L — ABNORMAL HIGH (ref 15–41)
Albumin: 3.3 g/dL — ABNORMAL LOW (ref 3.5–5.0)
Alkaline Phosphatase: 96 U/L (ref 38–126)
Bilirubin, Direct: 0.4 mg/dL — ABNORMAL HIGH (ref 0.0–0.2)
Indirect Bilirubin: 1.3 mg/dL — ABNORMAL HIGH (ref 0.3–0.9)
Total Bilirubin: 1.7 mg/dL — ABNORMAL HIGH (ref 0.3–1.2)
Total Protein: 7.6 g/dL (ref 6.5–8.1)

## 2021-05-06 LAB — MAGNESIUM: Magnesium: 2.2 mg/dL (ref 1.7–2.4)

## 2021-05-06 LAB — BRAIN NATRIURETIC PEPTIDE: B Natriuretic Peptide: 53.6 pg/mL (ref 0.0–100.0)

## 2021-05-06 LAB — APTT: aPTT: 23 seconds — ABNORMAL LOW (ref 24–36)

## 2021-05-06 LAB — TROPONIN I (HIGH SENSITIVITY)
Troponin I (High Sensitivity): 22 ng/L — ABNORMAL HIGH (ref <18)
Troponin I (High Sensitivity): 14 ng/L (ref <18)

## 2021-05-06 LAB — SEDIMENTATION RATE: Sed Rate: 46 mm/hr — ABNORMAL HIGH (ref 0–16)

## 2021-05-06 LAB — LACTIC ACID, PLASMA
Lactic Acid, Venous: 1.4 mmol/L (ref 0.5–1.9)
Lactic Acid, Venous: 2.4 mmol/L (ref 0.5–1.9)

## 2021-05-06 LAB — PROTIME-INR
INR: 1 (ref 0.8–1.2)
Prothrombin Time: 13.2 seconds (ref 11.4–15.2)

## 2021-05-06 LAB — LIPASE, BLOOD: Lipase: 22 U/L (ref 11–51)

## 2021-05-06 LAB — RESP PANEL BY RT-PCR (FLU A&B, COVID) ARPGX2
Influenza A by PCR: NEGATIVE
Influenza B by PCR: NEGATIVE
SARS Coronavirus 2 by RT PCR: NEGATIVE

## 2021-05-06 LAB — PROCALCITONIN: Procalcitonin: <0.1 ng/mL

## 2021-05-06 IMAGING — CR DG LUMBAR SPINE COMPLETE 4+V
5 series · 5 of 5 positions shown · non-contrast
Comparison: [DATE].

CLINICAL DATA: Fall in late [REDACTED].  Continued low back pain.

EXAM:
LUMBAR SPINE - COMPLETE 4+ VIEW

[l-spine ap]
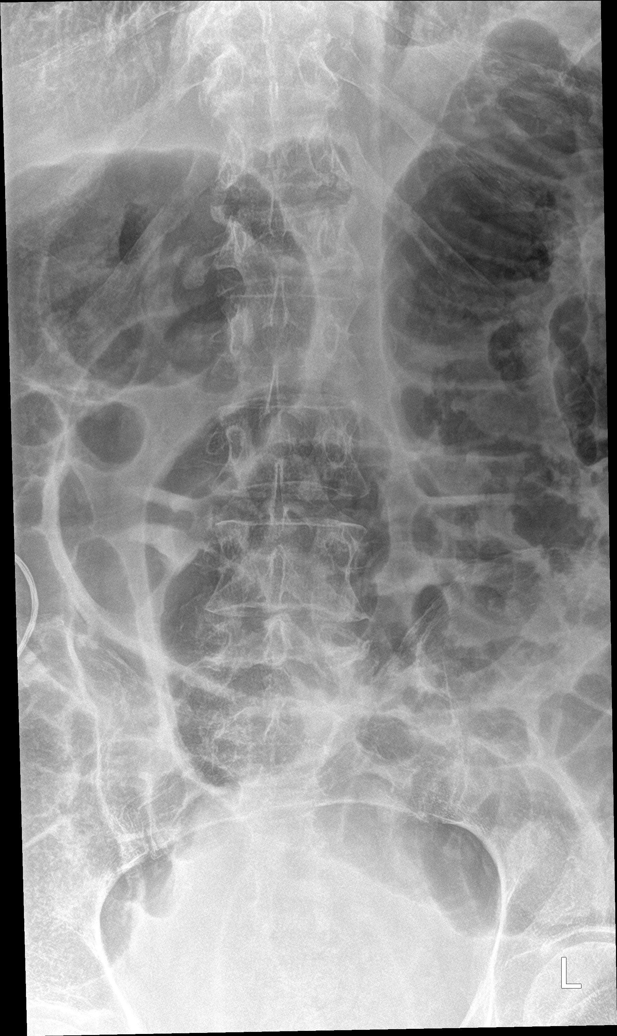

[l-spine obl (1 of 2)]
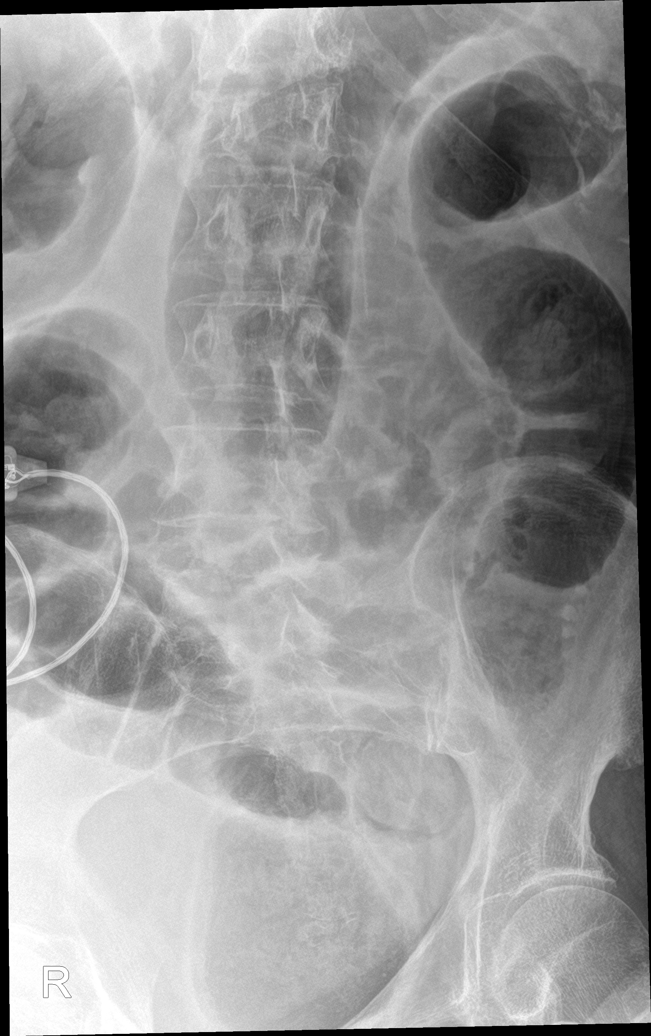

[l-spine obl (2 of 2)]
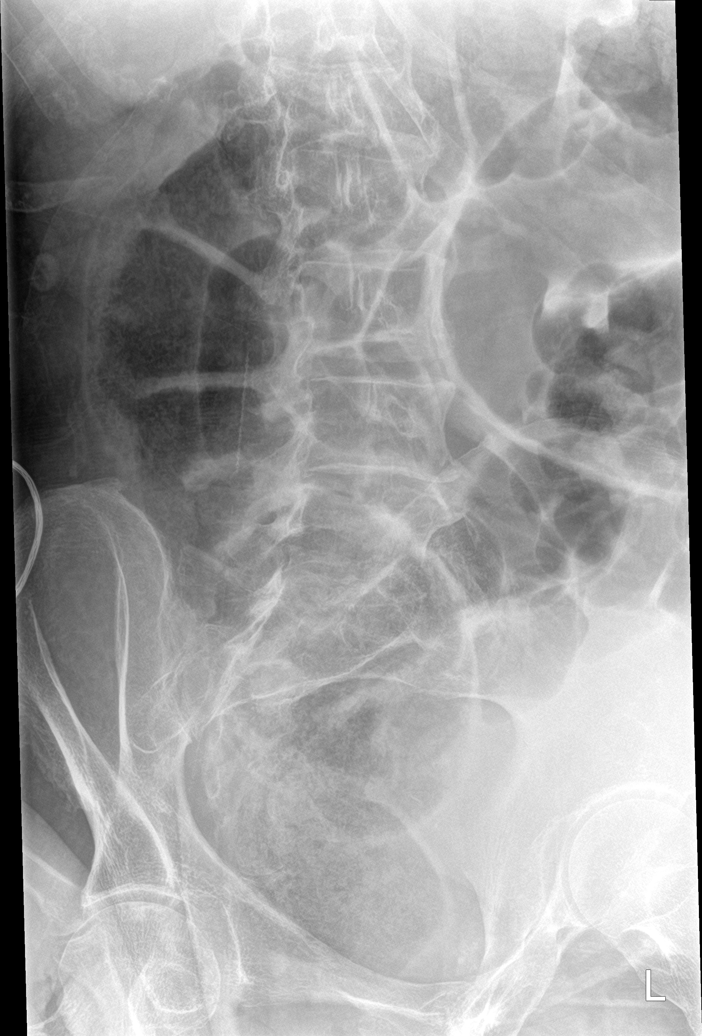

[l-spine lat]
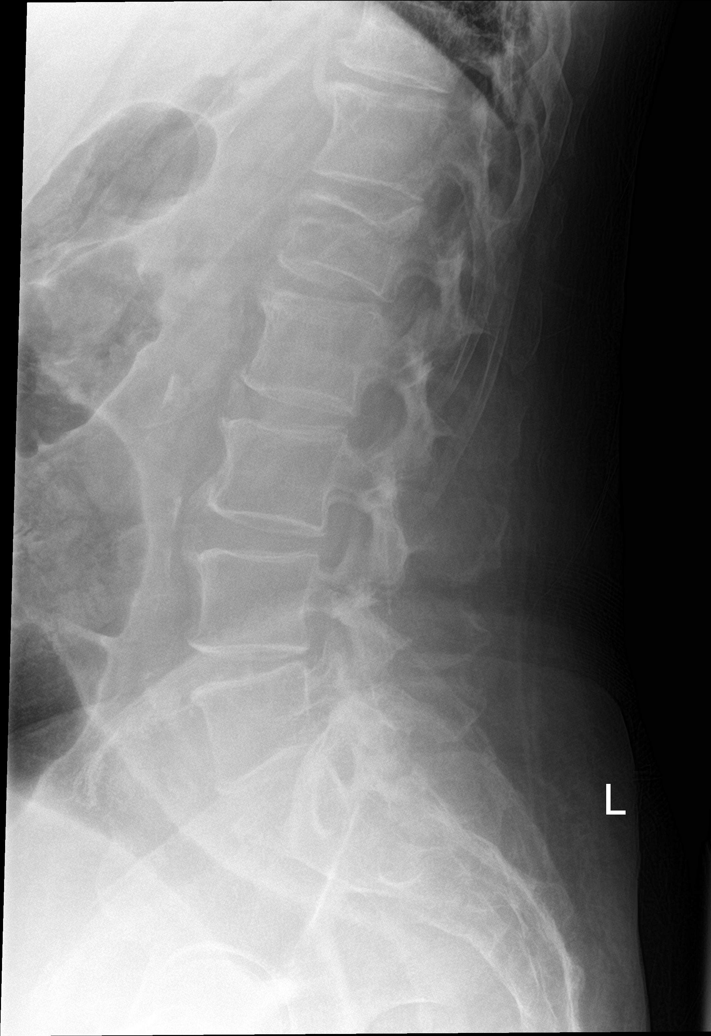

[l-spine spot]
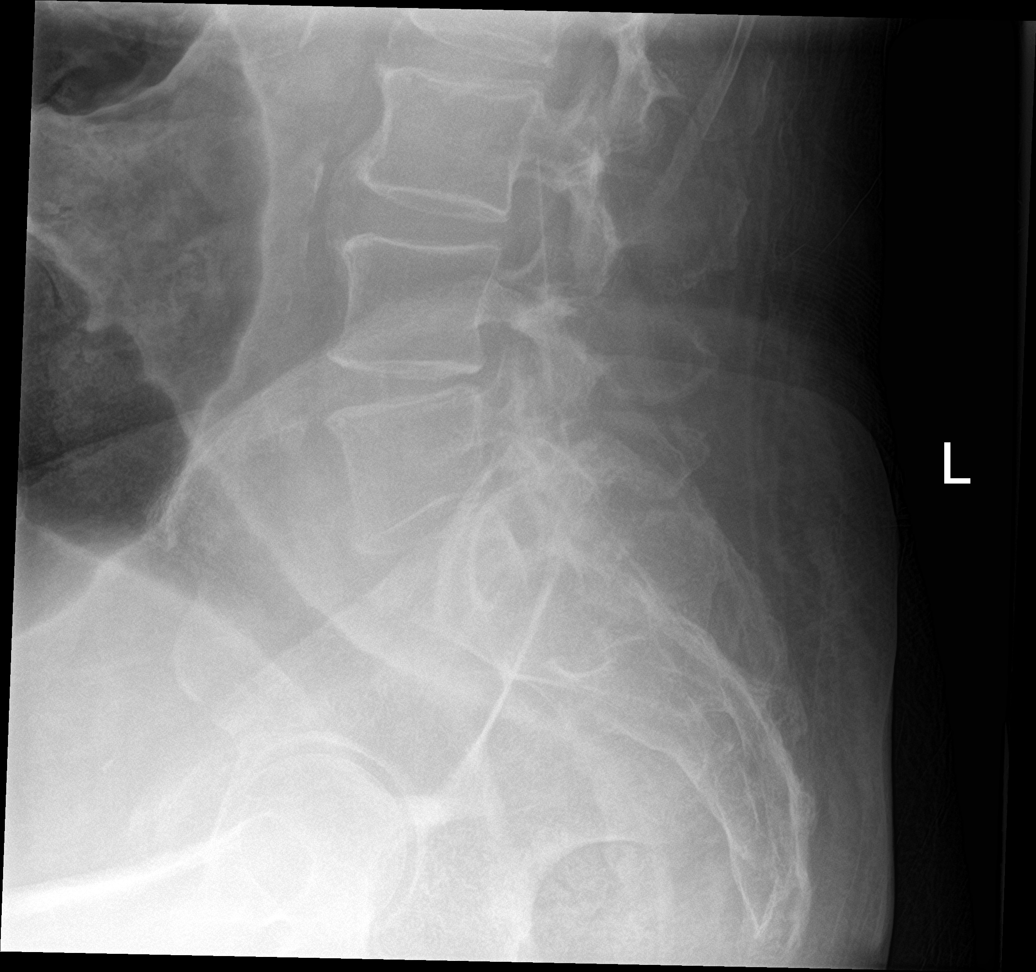

[5 of 5 positions shown; findings below may reference images not displayed]

FINDINGS: Fracture of L1, with approximately 30% decrease in the anterior and
central vertebral body height, posterior vertebral body height
maintained. This is new compared to the prior radiographs.

No other fractures.  No spondylolisthesis.

Mild loss of disc height at L4-L5. Remaining lumbar discs are well
preserved in height.

Small endplate osteophytes noted from L2 through L5.
IMPRESSION: 1. Mild to moderate compression fracture of L1, which is new since
the prior radiographs, and consistent with a fracture from the fall
in late [DATE].
2. No other fractures and no other change.

## 2021-05-06 IMAGING — CR DG THORACIC SPINE 2V
4 series · 4 of 4 positions shown · non-contrast
Comparison: None.

CLINICAL DATA: Fall in late [REDACTED].  Continued back pain.

EXAM:
THORACIC SPINE 2 VIEWS

[t-spine ap (1 of 2)]
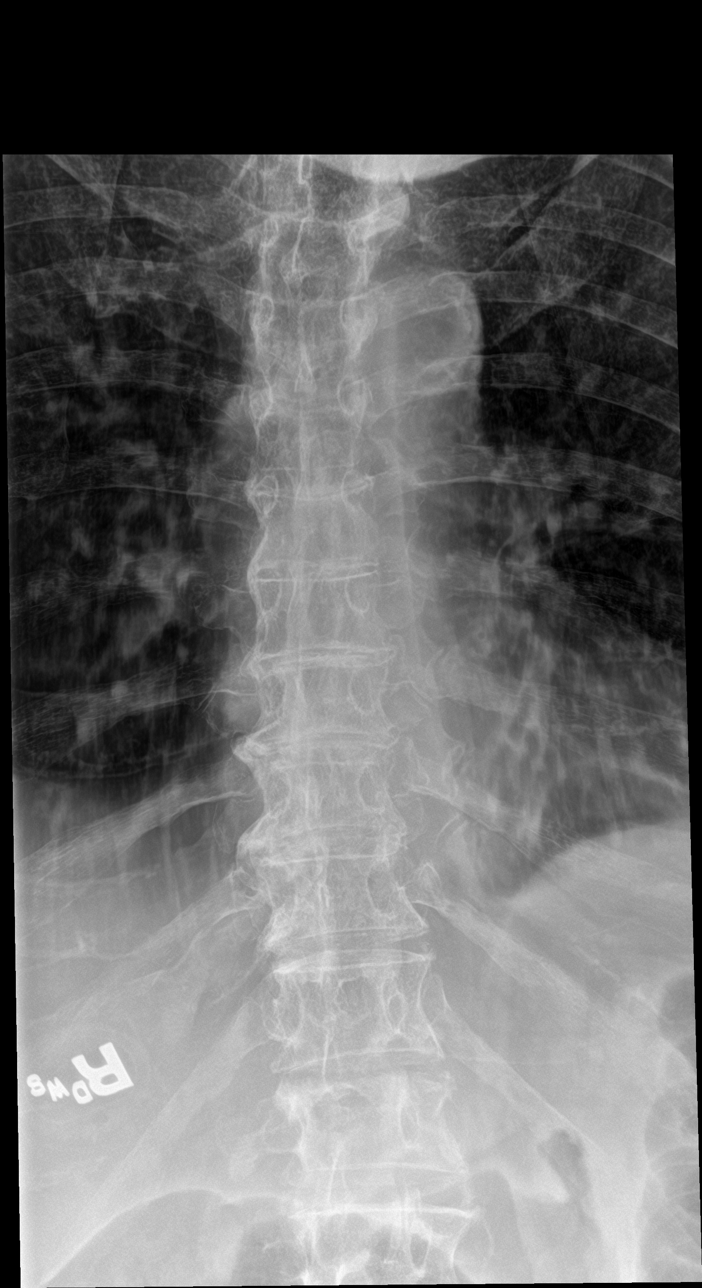

[t-spine swimmers]
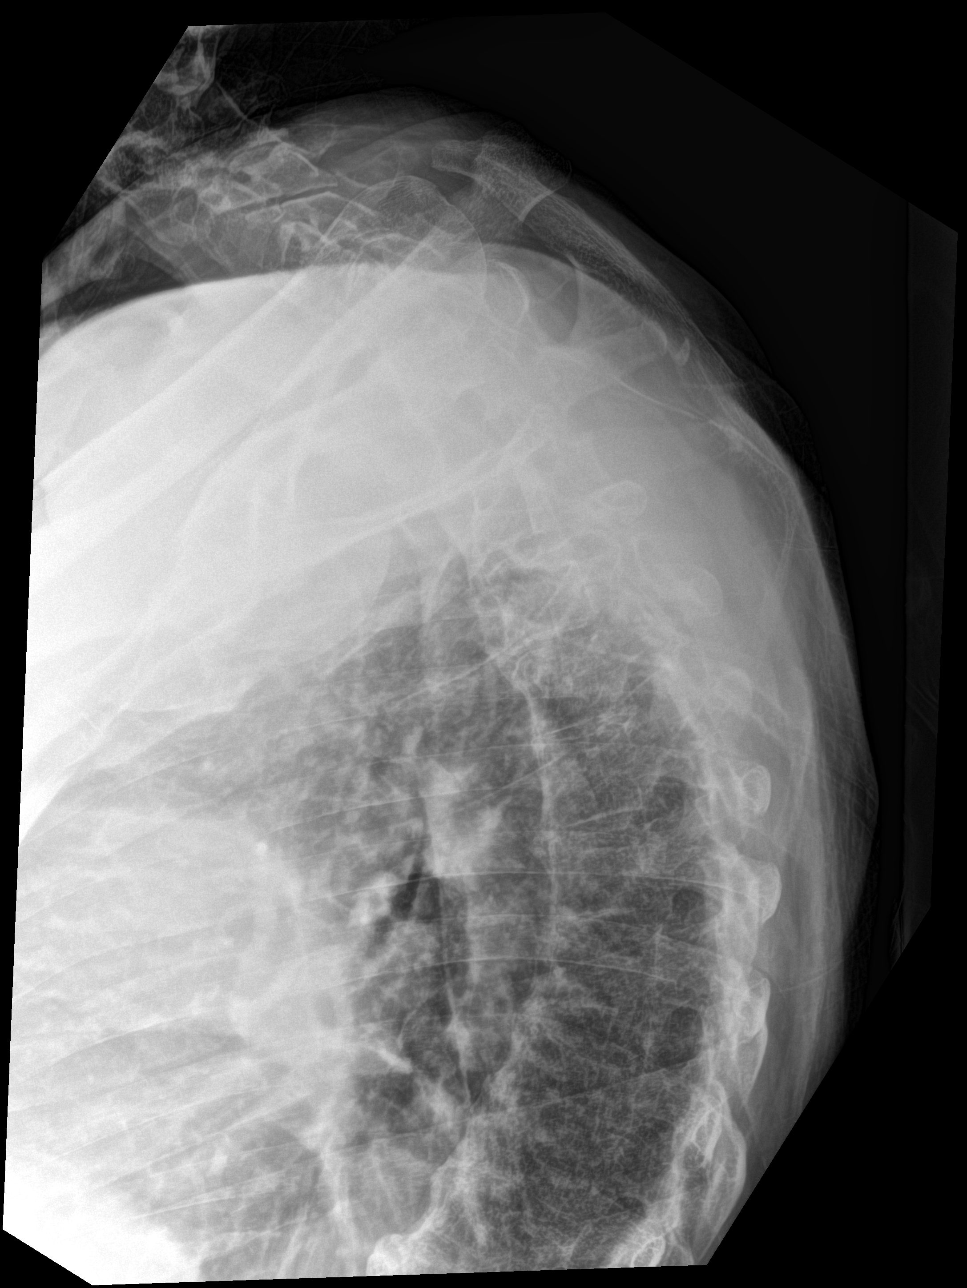

[t-spine ap (2 of 2)]
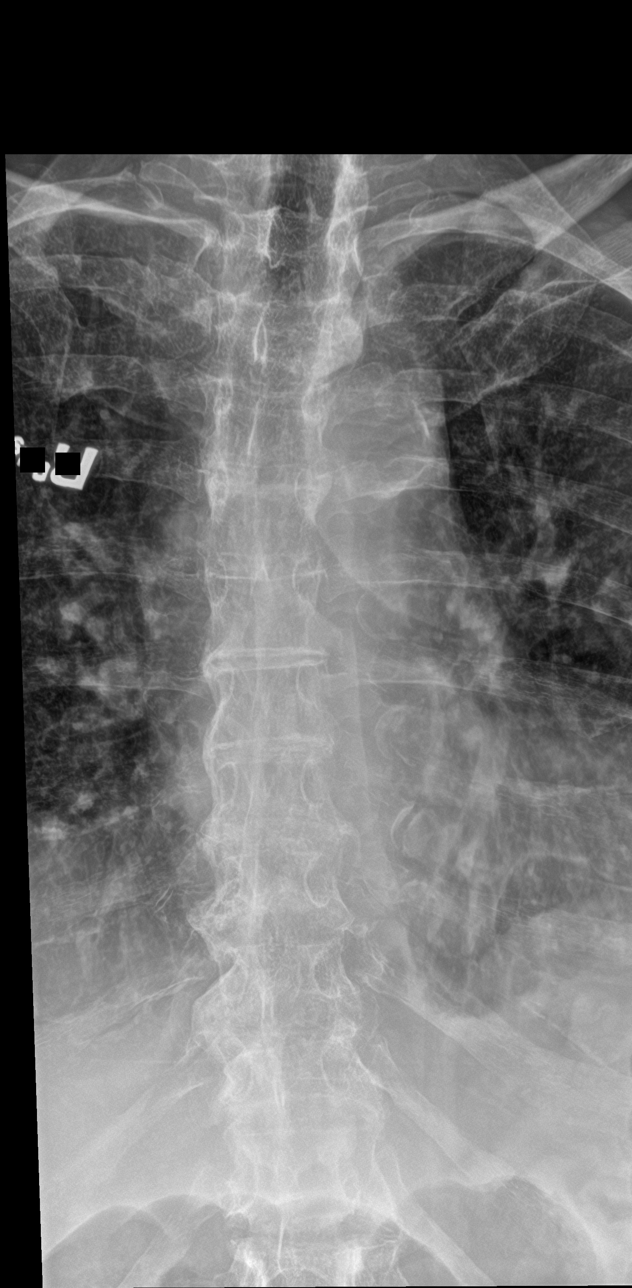

[t-spine lat]
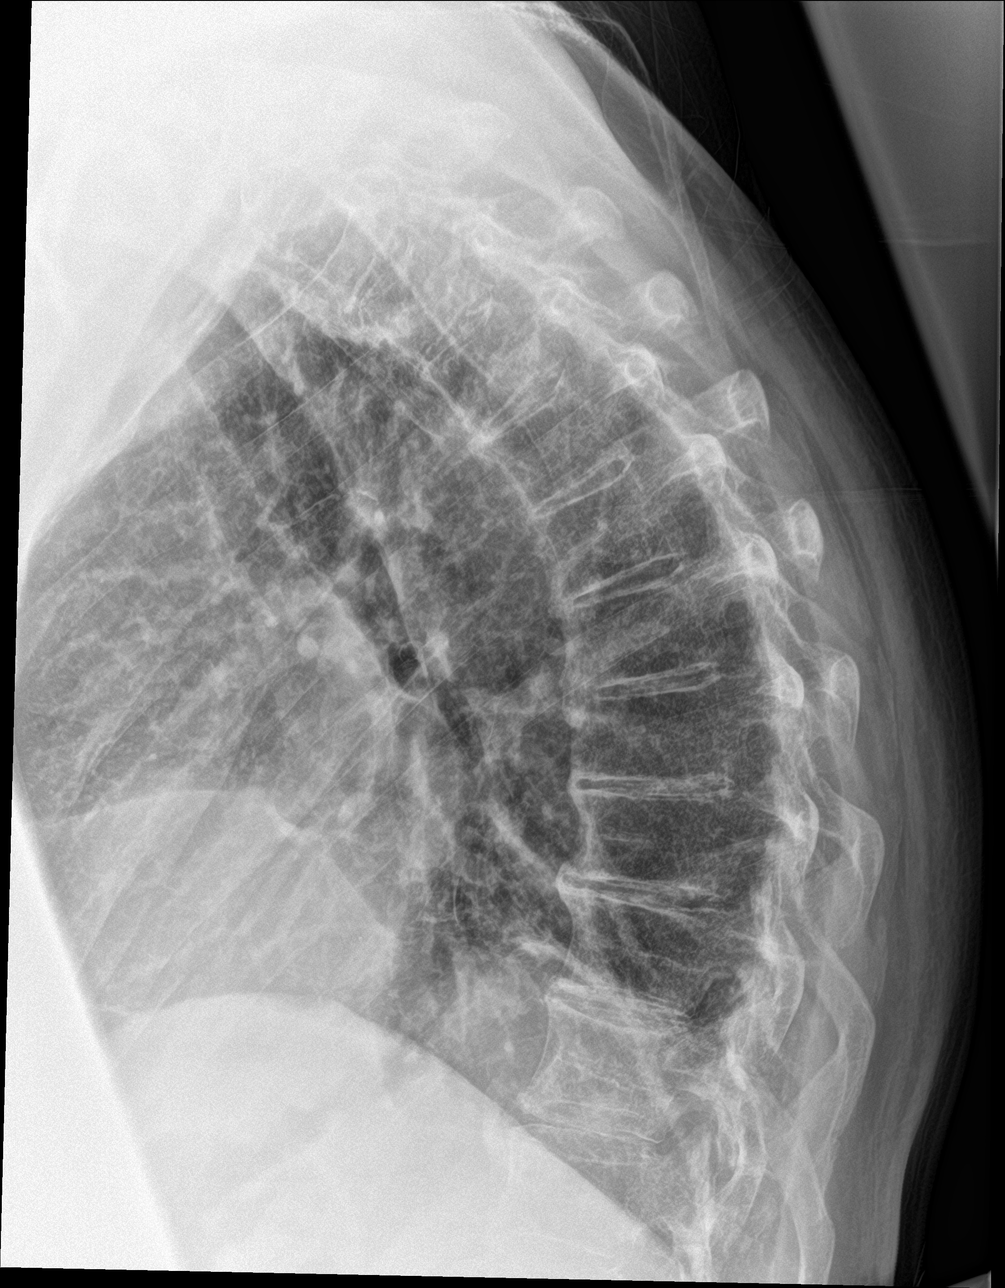

[4 of 4 positions shown; findings below may reference images not displayed]

FINDINGS: No fracture or bone lesion.  Osseous structures are demineralized.

No spondylolisthesis.

Loss of disc height with endplate osteophytes throughout the
thoracic spine.

Soft tissues are unremarkable.
IMPRESSION: 1. No fracture or acute finding.  No spondylolisthesis.
2. Disc degenerative changes.

## 2021-05-06 IMAGING — CR DG CHEST 2V
1 series · 2 of 2 positions shown · non-contrast
Comparison: [DATE]

CLINICAL DATA: Pt c/o weakness since last night. EMS reports
bilateral edema and warmth in his legs. Pt is supposed to be taking
amoxicillin but pt stopped taking them for diarrhea

EXAM:
CHEST - 2 VIEW

[Series 1: dg chest 2 view · 0.14mm/px · 2 of 2 slices shown]
[im 1/2]
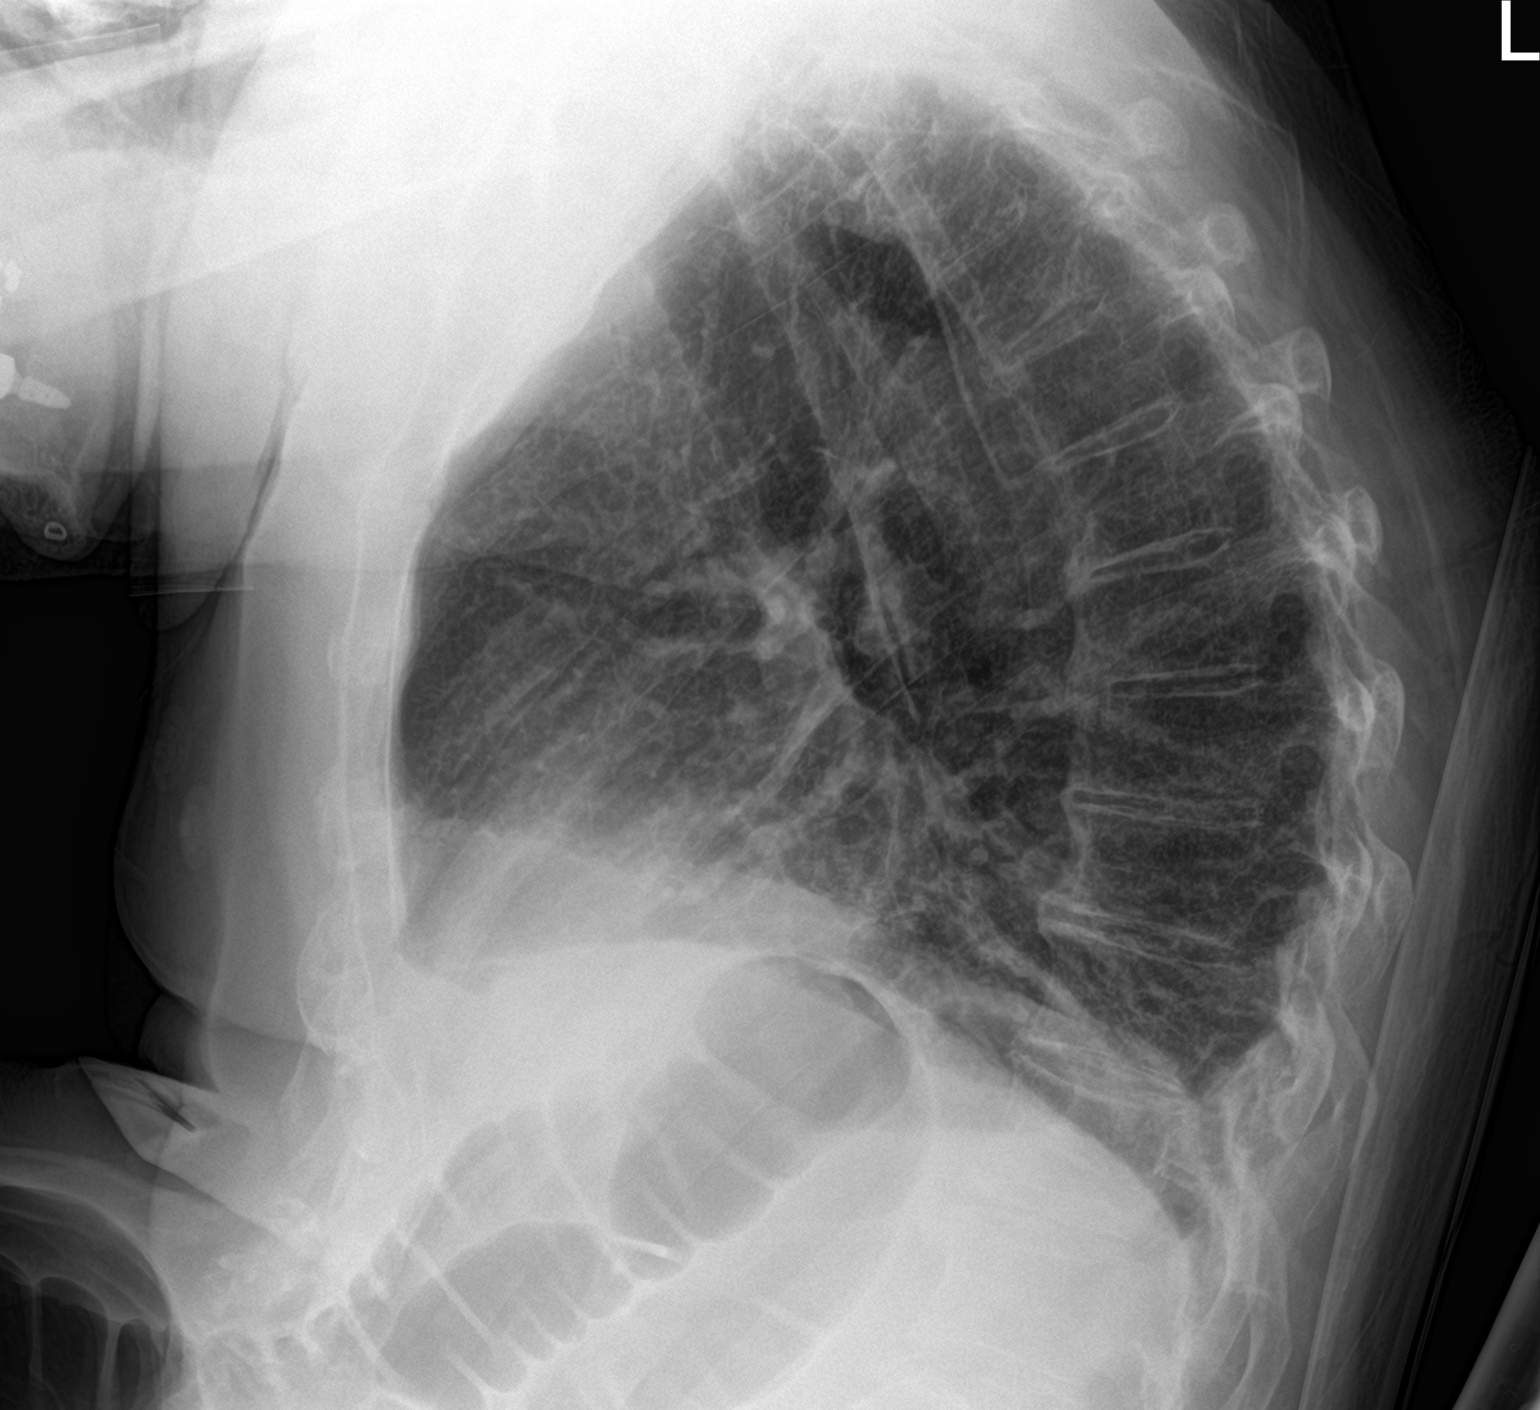
[im 2/2]
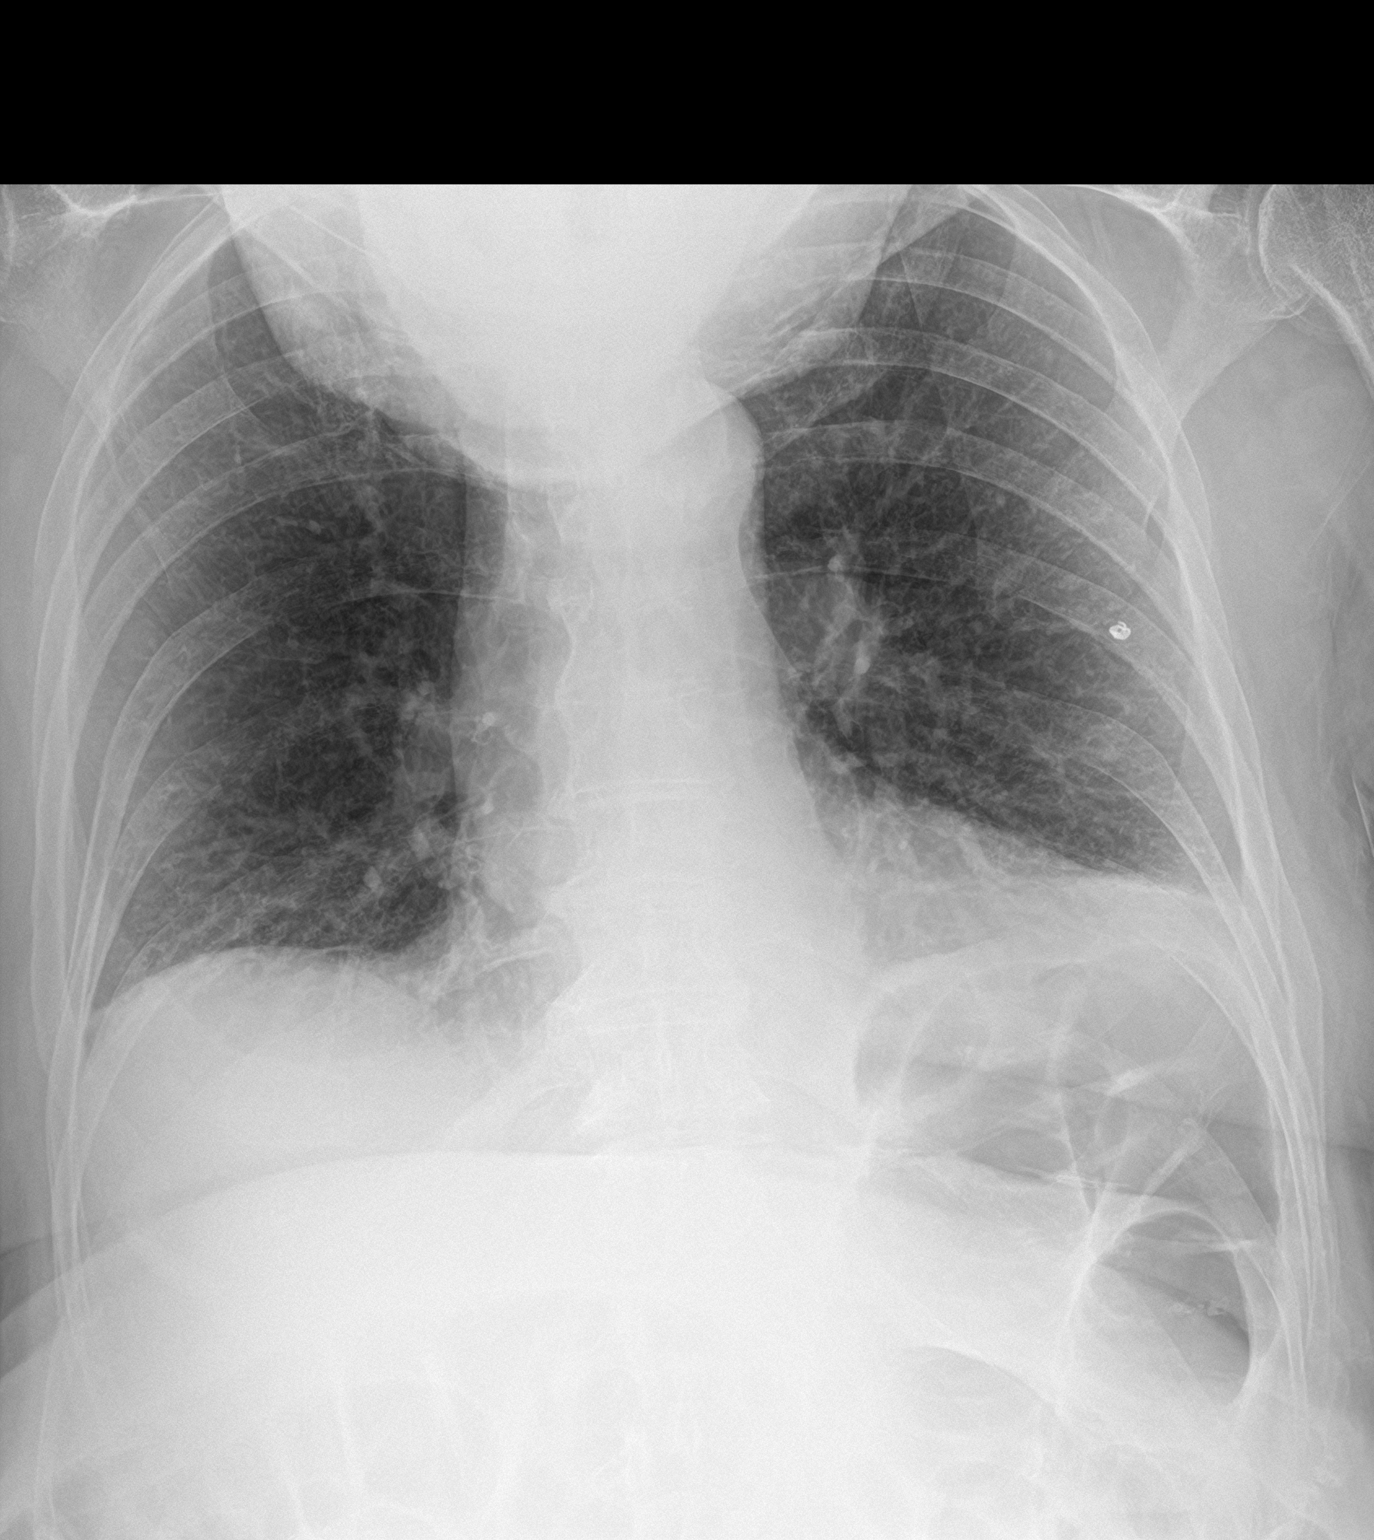

[2 of 2 positions shown; findings below may reference images not displayed]

FINDINGS: Cardiac silhouette is normal in size. No mediastinal or hilar
masses. No evidence of adenopathy.

Mild, left greater than right, linear lung base opacities consistent
with atelectasis. Remainder of the lungs is clear. No pleural
effusion or pneumothorax.

Skeletal structures are grossly intact.
IMPRESSION: No active cardiopulmonary disease.

## 2021-05-06 MED ORDER — AMLODIPINE BESYLATE 5 MG PO TABS
5.0000 mg | ORAL_TABLET | Freq: Every day | ORAL | Status: DC
  Administered 2021-05-07 – 2021-05-09 (×3): 5 mg via ORAL
  Filled 2021-05-06 (×3): qty 1

## 2021-05-06 MED ORDER — CIPROFLOXACIN HCL 500 MG PO TABS: 500.0000 mg | ORAL_TABLET | Freq: Two times a day (BID) | ORAL | Status: DC

## 2021-05-06 MED ORDER — METHOCARBAMOL 500 MG PO TABS
500.0000 mg | ORAL_TABLET | Freq: Three times a day (TID) | ORAL | Status: DC | PRN
  Filled 2021-05-06: qty 1

## 2021-05-06 MED ORDER — VANCOMYCIN HCL IN DEXTROSE 1-5 GM/200ML-% IV SOLN
1000.0000 mg | Freq: Once | INTRAVENOUS | Status: AC
Start: 2021-05-06 — End: 2021-05-07
  Administered 2021-05-06: 1000 mg via INTRAVENOUS
  Filled 2021-05-06: qty 200

## 2021-05-06 MED ORDER — VANCOMYCIN HCL 1500 MG/300ML IV SOLN
1500.0000 mg | Freq: Two times a day (BID) | INTRAVENOUS | Status: DC
  Administered 2021-05-07 – 2021-05-09 (×5): 1500 mg via INTRAVENOUS
  Filled 2021-05-06 (×6): qty 300

## 2021-05-06 MED ORDER — HYDRALAZINE HCL 20 MG/ML IJ SOLN: 5.0000 mg | INTRAMUSCULAR | Status: DC | PRN

## 2021-05-06 MED ORDER — ATORVASTATIN CALCIUM 20 MG PO TABS
40.0000 mg | ORAL_TABLET | Freq: Every day | ORAL | Status: DC
  Administered 2021-05-07 – 2021-05-09 (×3): 40 mg via ORAL
  Filled 2021-05-06 (×3): qty 2

## 2021-05-06 MED ORDER — LIDOCAINE HCL URETHRAL/MUCOSAL 2 % EX GEL
1.0000 "application " | Freq: Once | CUTANEOUS | Status: AC
  Administered 2021-05-06: 1 via URETHRAL
  Filled 2021-05-06: qty 10

## 2021-05-06 MED ORDER — VANCOMYCIN HCL 1500 MG/300ML IV SOLN
1500.0000 mg | Freq: Once | INTRAVENOUS | Status: AC
  Administered 2021-05-06: 1500 mg via INTRAVENOUS
  Filled 2021-05-06: qty 300

## 2021-05-06 MED ORDER — ONDANSETRON HCL 4 MG/2ML IJ SOLN: 4.0000 mg | Freq: Three times a day (TID) | INTRAMUSCULAR | Status: DC | PRN

## 2021-05-06 MED ORDER — HEPARIN SODIUM (PORCINE) 5000 UNIT/ML IJ SOLN
5000.0000 [IU] | Freq: Three times a day (TID) | INTRAMUSCULAR | Status: DC
  Administered 2021-05-07 (×2): 5000 [IU] via SUBCUTANEOUS
  Filled 2021-05-06 (×2): qty 1

## 2021-05-06 MED ORDER — MORPHINE SULFATE (PF) 2 MG/ML IV SOLN
2.0000 mg | INTRAVENOUS | Status: DC | PRN
Start: 2021-05-06 — End: 2021-05-09

## 2021-05-06 MED ORDER — LIDOCAINE 5 % EX PTCH
1.0000 | MEDICATED_PATCH | CUTANEOUS | Status: DC
  Administered 2021-05-06 – 2021-05-08 (×2): 1 via TRANSDERMAL
  Filled 2021-05-06 (×3): qty 1

## 2021-05-06 MED ORDER — POTASSIUM CHLORIDE CRYS ER 20 MEQ PO TBCR
40.0000 meq | EXTENDED_RELEASE_TABLET | Freq: Once | ORAL | Status: AC
  Administered 2021-05-06: 40 meq via ORAL
  Filled 2021-05-06: qty 2

## 2021-05-06 MED ORDER — ACETAMINOPHEN 500 MG PO TABS
1000.0000 mg | ORAL_TABLET | Freq: Once | ORAL | Status: AC
  Administered 2021-05-06: 1000 mg via ORAL
  Filled 2021-05-06: qty 2

## 2021-05-06 MED ORDER — ACETAMINOPHEN 325 MG PO TABS: 650.0000 mg | ORAL_TABLET | Freq: Four times a day (QID) | ORAL | Status: DC | PRN

## 2021-05-06 MED ORDER — OXYCODONE-ACETAMINOPHEN 5-325 MG PO TABS
1.0000 | ORAL_TABLET | ORAL | Status: DC | PRN
  Administered 2021-05-07 – 2021-05-09 (×4): 1 via ORAL
  Filled 2021-05-06 (×4): qty 1

## 2021-05-06 MED ORDER — ASPIRIN 81 MG PO CHEW
81.0000 mg | CHEWABLE_TABLET | Freq: Every day | ORAL | Status: DC
Start: 2021-05-06 — End: 2021-05-09
  Administered 2021-05-07 – 2021-05-09 (×3): 81 mg via ORAL
  Filled 2021-05-06 (×3): qty 1

## 2021-05-06 MED ORDER — NAPROXEN 500 MG PO TABS
500.0000 mg | ORAL_TABLET | Freq: Once | ORAL | Status: AC
  Administered 2021-05-06: 500 mg via ORAL
  Filled 2021-05-06: qty 1

## 2021-05-06 NOTE — ED Notes (Signed)
This RN made 2 unsuccessful attempts at blood specimen collection for a second set of blood cultures

## 2021-05-06 NOTE — H&P (Signed)
History and Physical    Brandon Hooper YEB:[id number] DOB: [birth year] DOA: 05/06/2021  Referring MD/NP/PA:   PCP: Derinda Late, MD   Patient coming from:  The patient is coming from home.  At baseline, pt is independent for most of ADL.        Chief Complaint: back pain  HPI: Brandon Hooper is a 76 y.o. male with medical history significant of hypertension, hyperlipidemia, stroke, chronic venous insufficiency of legs, hiatal hernia, chronic back pain, dCHF, who presents with back pain and bilateral leg swelling.  Patient states that he has chronic lower back pain. After he had stroke and fall on 02/12/22, his lower back pain has been worsening.  The back pain is constant, sharp, severe, nonradiating.  In the past several days, the pain is so severe, he cannot walk normally.  He has chronic venous insufficiency in legs.  He states that his leg swelling has worsened recently. He has ulcers in left leg.  Both legs are erythematous and painful.  Denies fever or chills.  Patient was given prescription of amoxicillin by his doctor.  He took 1 dose on Thursday, but developed diarrhea, therefore he stopped taking amoxicillin.  He states that he has had several episode of watery diarrhea.  No abdominal pain.  He has nausea, vomited once earlier today, which has resolved.  Currently patient does not feel nauseated, no vomiting.  No symptoms of UTI.  Patient developed urinary retention and Foley cath is placed in ED.  ED Course: pt was found to have WBC 12.4, troponin level 22 --> 14, negative urinalysis, pending COVID PCR, potassium 3.3, renal function okay, temperature normal, blood pressure 153/69, heart rate 94, RR 24, oxygen saturation 95% on room air.  Chest x-ray negative.  X-ray of T-spine and sacral/coccyx negative.  CT of lumbar spine showed L1 compression fracture. Patient is admitted to [facility] bed as inpatient.  Dr. Johnney Killian of neurosurgery is consulted.  EKG: I have personally reviewed.  Sinus  rhythm, QTC 452, early R wave progression  Review of Systems:   General: no fevers, chills, no body weight gain, has fatigue HEENT: no blurry vision, hearing changes or sore throat Respiratory: no dyspnea, coughing, wheezing CV: no chest pain, no palpitations GI: no nausea, vomiting, abdominal pain, has diarrhea, no  constipation GU: no dysuria, burning on urination, increased urinary frequency, hematuria  Ext: has leg edema Neuro: no unilateral weakness, numbness, or tingling, no vision change or hearing loss Skin: no rash, no skin tear. MSK: has lower back pain Heme: No easy bruising.  Travel history: No recent long distant travel.   Allergy:  Allergies  Allergen Reactions   Erythromycin Hives   Ofloxacin     Other reaction(s): Other (See Comments) "Arthritic pain". The patient reported 5 years of left hip pain when he last took ofloxacin. Likely could try another fluoroquinolone.    Cephalexin Rash    The patient previously tolerated cephalexin in 2013 and reported in 2019 that he developed redness and itching of his knees while on cephalexin. Not entirely clear it was a true allergic reaction.    Clindamycin Hcl Rash   Doxycycline Calcium Rash   Sulfamethoxazole-Trimethoprim Rash   Tetracycline Rash    Past Medical History:  Diagnosis Date   Arthritis    Bone loss    Hypertension     Past Surgical History:  Procedure Laterality Date   EYE SURGERY Bilateral    Feb 2006 and then in Jan 2009  Social History:  reports that he has never smoked. He has never used smokeless tobacco. He reports that he does not currently use alcohol. No history on file for drug use.  Family History:  Family History  Problem Relation Age of Onset   Diabetes Father      Prior to Admission medications   Medication Sig Start Date End Date Taking? Authorizing Provider  aspirin 81 MG chewable tablet Chew 1 tablet (81 mg total) by mouth daily. 02/14/21   Sharen Hones, MD  atorvastatin  (LIPITOR) 40 MG tablet Take 1 tablet (40 mg total) by mouth daily. 02/14/21   Sharen Hones, MD  oxyCODONE-acetaminophen (PERCOCET/ROXICET) 5-325 MG tablet Take 1 tablet by mouth every 4 (four) hours as needed for moderate pain. 02/14/21   Sharen Hones, MD    Physical Exam: Vitals:   05/06/21 1630 05/06/21 1700 05/06/21 1730 05/06/21 1829  BP: (!) 154/79 (!) 147/69 (!) 144/67 (!) 154/75  Pulse: 87 86 86 88  Resp: (!) 24 (!) 24 16   Temp: 98 F (36.7 C)   97.6 F (36.4 C)  TempSrc:      SpO2: 100% 100% 99% 99%  Weight:      Height:       General: Not in acute distress HEENT:       Eyes: PERRL, EOMI, no scleral icterus.       ENT: No discharge from the ears and nose, no pharynx injection, no tonsillar enlargement.        Neck: No JVD, no bruit, no mass felt. Heme: No neck lymph node enlargement. Cardiac: S1/S2, RRR, No murmurs, No gallops or rubs. Respiratory: No rales, wheezing, rhonchi or rubs. GI: Soft, nondistended, nontender, no rebound pain, no organomegaly, BS present. GU: No hematuria Ext: Has chronic venous insufficiency change in both legs, has 3+ bilateral leg edema, has skin ulcers in left leg, with erythema and tenderness in both legs.      Musculoskeletal: has tenderness in the midline of lower back. Skin: No rashes.  Neuro: Alert, oriented X3, cranial nerves II-XII grossly intact, moves all extremities normally.  Psych: Patient is not psychotic, no suicidal or hemocidal ideation.  Labs on Admission: I have personally reviewed following labs and imaging studies  CBC: Recent Labs  Lab 05/06/21 0728  WBC 12.4*  HGB 12.3*  HCT 37.6*  MCV 91.5  PLT 628   Basic Metabolic Panel: Recent Labs  Lab 05/06/21 0728  NA 136  K 3.3*  CL 101  CO2 23  GLUCOSE 95  BUN 14  CREATININE 0.81  CALCIUM 8.6*  MG 2.2   GFR: Estimated Creatinine Clearance: 100.4 mL/min (by C-G formula based on SCr of 0.81 mg/dL). Liver Function Tests: Recent Labs  Lab  05/06/21 0728  AST 68*  ALT 22  ALKPHOS 96  BILITOT 1.7*  PROT 7.6  ALBUMIN 3.3*   Recent Labs  Lab 05/06/21 0728  LIPASE 22   No results for input(s): AMMONIA in the last 168 hours. Coagulation Profile: No results for input(s): INR, PROTIME in the last 168 hours. Cardiac Enzymes: No results for input(s): CKTOTAL, CKMB, CKMBINDEX, TROPONINI in the last 168 hours. BNP (last 3 results) No results for input(s): PROBNP in the last 8760 hours. HbA1C: No results for input(s): HGBA1C in the last 72 hours. CBG: No results for input(s): GLUCAP in the last 168 hours. Lipid Profile: No results for input(s): CHOL, HDL, LDLCALC, TRIG, CHOLHDL, LDLDIRECT in the last 72 hours. Thyroid Function Tests: No results  for input(s): TSH, T4TOTAL, FREET4, T3FREE, THYROIDAB in the last 72 hours. Anemia Panel: No results for input(s): VITAMINB12, FOLATE, FERRITIN, TIBC, IRON, RETICCTPCT in the last 72 hours. Urine analysis:    Component Value Date/Time   COLORURINE YELLOW 05/06/2021 1526   APPEARANCEUR CLEAR 05/06/2021 1526   LABSPEC 1.020 05/06/2021 1526   PHURINE 5.5 05/06/2021 1526   GLUCOSEU NEGATIVE 05/06/2021 1526   HGBUR NEGATIVE 05/06/2021 1526   BILIRUBINUR SMALL (A) 05/06/2021 1526   KETONESUR 15 (A) 05/06/2021 1526   PROTEINUR 100 (A) 05/06/2021 1526   NITRITE NEGATIVE 05/06/2021 1526   LEUKOCYTESUR NEGATIVE 05/06/2021 1526   Sepsis Labs: '@LABRCNTIP' (procalcitonin:4,lacticidven:4) ) Recent Results (from the past 240 hour(s))  Resp Panel by RT-PCR (Flu A&B, Covid) Nasopharyngeal Swab     Status: None   Collection Time: 05/06/21  5:05 PM   Specimen: Nasopharyngeal Swab; Nasopharyngeal(NP) swabs in vial transport medium  Result Value Ref Range Status   SARS Coronavirus 2 by RT PCR NEGATIVE NEGATIVE Final    Comment: (NOTE) SARS-CoV-2 target nucleic acids are NOT DETECTED.  The SARS-CoV-2 RNA is generally detectable in upper respiratory specimens during the acute phase of  infection. The lowest concentration of SARS-CoV-2 viral copies this assay can detect is 138 copies/mL. A negative result does not preclude SARS-Cov-2 infection and should not be used as the sole basis for treatment or other patient management decisions. A negative result may occur with  improper specimen collection/handling, submission of specimen other than nasopharyngeal swab, presence of viral mutation(s) within the areas targeted by this assay, and inadequate number of viral copies(<138 copies/mL). A negative result must be combined with clinical observations, patient history, and epidemiological information. The expected result is Negative.  Fact Sheet for Patients:  EntrepreneurPulse.com.au  Fact Sheet for Healthcare Providers:  IncredibleEmployment.be  This test is no t yet approved or cleared by the [place] FDA and  has been authorized for detection and/or diagnosis of SARS-CoV-2 by FDA under an Emergency Use Authorization (EUA). This EUA will remain  in effect (meaning this test can be used) for the duration of the COVID-19 declaration under Section 564(b)(1) of the Act, 21 U.S.C.section 360bbb-3(b)(1), unless the authorization is terminated  or revoked sooner.       Influenza A by PCR NEGATIVE NEGATIVE Final   Influenza B by PCR NEGATIVE NEGATIVE Final    Comment: (NOTE) The Xpert Xpress SARS-CoV-2/FLU/RSV plus assay is intended as an aid in the diagnosis of influenza from Nasopharyngeal swab specimens and should not be used as a sole basis for treatment. Nasal washings and aspirates are unacceptable for Xpert Xpress SARS-CoV-2/FLU/RSV testing.  Fact Sheet for Patients: EntrepreneurPulse.com.au  Fact Sheet for Healthcare Providers: IncredibleEmployment.be  This test is not yet approved or cleared by the [place] FDA and has been authorized for detection and/or diagnosis of SARS-CoV-2  by FDA under an Emergency Use Authorization (EUA). This EUA will remain in effect (meaning this test can be used) for the duration of the COVID-19 declaration under Section 564(b)(1) of the Act, 21 U.S.C. section 360bbb-3(b)(1), unless the authorization is terminated or revoked.  Performed at [facility], [address], [place], [place] [address]      Radiological Exams on Admission: DG Chest 2 View  Result Date: 05/06/2021 CLINICAL DATA:  Pt c/o weakness since last night. EMS reports bilateral edema and warmth in his legs. Pt is supposed to be taking amoxicillin but pt stopped taking them for diarrhea EXAM: CHEST - 2 VIEW COMPARISON:  02/12/2021 FINDINGS: Cardiac silhouette is normal in size. No mediastinal or hilar masses. No evidence of adenopathy. Mild, left greater than right, linear lung base opacities consistent with atelectasis. Remainder of the lungs is clear. No pleural effusion or pneumothorax. Skeletal structures are grossly intact. IMPRESSION: No active cardiopulmonary disease. Electronically Signed   By: Lajean Manes M.D.   On: 05/06/2021 12:27   DG Thoracic Spine 2 View  Result Date: 05/06/2021 CLINICAL DATA:  Fall in late December.  Continued back pain. EXAM: THORACIC SPINE 2 VIEWS COMPARISON:  None. FINDINGS: No fracture or bone lesion.  Osseous structures are demineralized. No spondylolisthesis. Loss of disc height with endplate osteophytes throughout the thoracic spine. Soft tissues are unremarkable. IMPRESSION: 1. No fracture or acute finding.  No spondylolisthesis. 2. Disc degenerative changes. Electronically Signed   By: Lajean Manes M.D.   On: 05/06/2021 15:14   DG Lumbar Spine Complete  Result Date: 05/06/2021 CLINICAL DATA:  Fall in late December.  Continued low back pain. EXAM: LUMBAR SPINE - COMPLETE 4+ VIEW COMPARISON:  02/12/2021. FINDINGS: Fracture of L1, with approximately 30% decrease in the anterior and central vertebral body height, posterior  vertebral body height maintained. This is new compared to the prior radiographs. No other fractures.  No spondylolisthesis. Mild loss of disc height at L4-L5. Remaining lumbar discs are well preserved in height. Small endplate osteophytes noted from L2 through L5. IMPRESSION: 1. Mild to moderate compression fracture of L1, which is new since the prior radiographs, and consistent with a fracture from the fall in late December 2022. 2. No other fractures and no other change. Electronically Signed   By: Lajean Manes M.D.   On: 05/06/2021 15:17   DG Sacrum/Coccyx  Result Date: 05/06/2021 CLINICAL DATA:  Fall in late December 2022. Continued low back pain. EXAM: SACRUM AND COCCYX - 2+ VIEW COMPARISON:  None. FINDINGS: No fracture or bone lesion. SI joints normally spaced and aligned. Soft tissues are unremarkable. IMPRESSION: No fracture. Electronically Signed   By: Lajean Manes M.D.   On: 05/06/2021 15:18   CT Lumbar Spine Wo Contrast  Result Date: 05/06/2021 CLINICAL DATA:  Compression fracture, back pain EXAM: CT LUMBAR SPINE WITHOUT CONTRAST TECHNIQUE: Multidetector CT imaging of the lumbar spine was performed without intravenous contrast administration. Multiplanar CT image reconstructions were also generated. RADIATION DOSE REDUCTION: This exam was performed according to the departmental dose-optimization program which includes automated exposure control, adjustment of the mA and/or kV according to patient size and/or use of iterative reconstruction technique. COMPARISON:  Lumbar spine radiographs 02/12/2021, 05/06/2021 FINDINGS: Segmentation: Standard; the lowest formed disc space is designated L5-S1 Alignment: Normal. Vertebrae: There is a transverse fracture through the superior aspect of the L1 vertebral body with involvement of the anterior and superior endplates. There is no extension to the posterior elements. There is mild bony retropulsion and mild loss of vertebral body height. These findings  are new since 02/12/2021. The other vertebral body heights are preserved. There is no suspicious osseous abnormality. Paraspinal and other soft tissues: The paraspinal soft tissues are unremarkable. There is calcified atherosclerotic plaque in the infrarenal abdominal aorta. There is a moderate stool burden in the rectum. Disc levels: The disc heights are overall preserved. There is mild degenerative endplate change in the lumbar spine. There is mild multilevel facet arthropathy, most advanced at L5-S1. There is mild spinal canal stenosis at T12-L1 due to the bony retropulsion. Otherwise, there is no significant spinal canal or neural foraminal stenosis. IMPRESSION: 1.  Acute appearing fracture of the L1 vertebral body involving the anterior and superior endplates with mild bony retropulsion and mild loss of vertebral body height. The retropulsion results in mild spinal canal stenosis. There is no evidence of extension into the posterior elements. 2. Moderate stool burden in the rectum. 3.  Aortic Atherosclerosis (ICD10-I70.0). Electronically Signed   By: Valetta Mole M.D.   On: 05/06/2021 16:06      Assessment/Plan Principal Problem:   Closed compression fracture of body of L1 vertebra (HCC) Active Problems:   Sepsis (HCC)   Bilateral lower leg cellulitis   Chronic diastolic CHF (congestive heart failure) (HCC)   Diarrhea   Essential hypertension   HLD (hyperlipidemia)   Hypokalemia   Stroke (HCC)   Closed compression fracture of body of L1 vertebra ([place]): Consulted Dr. Johnney Killian of neurosurgery --> recommended TLSO. I also consulted IR, Dr. Maryelizabeth Kaufmann for possible kyphoplasty procedure. He will kindly set up following up in his office.  Please give referral to Dr. Maryelizabeth Kaufmann at discharge.  -admit to [facility] bed as inpatient -As needed Percocet, morphine, Robaxin, Tylenol -Follow-up with Dr. Johnney Killian of neurosurgery follow-up -Follow-up Dr. Maryelizabeth Kaufmann of IR  Sepsis due to bilateral lower leg cellulitis:  Patient did not tolerate amoxicillin due to diarrhea.  Patient meets criteria for sepsis with WBC 12.4 and tachypnea with RR 24.  Pending lactic acid level - Blood cultures x 2  - ESR and CRP - wound care consult - IV vancomycin - f/u LE venous Doppler to rule out  DVT -will get Procalcitonin and trend lactic acid levels per sepsis protocol. -IVF: will not give IVF unless lactic acid is elevated due to hx of CHF and severe bilateral leg edema  Chronic diastolic CHF (congestive heart failure) ([place]): 2D echo on 02/12/2021 showed EF 60-65% with grade 1 diastolic dysfunction.  Patient has 3+ leg edema, but no JVD, no shortness of breath or oxygen desaturation.  Does not seem to have CHF exacerbation.  His severe leg edema is likely due to venous insufficiency. -Will not start diuretics now due to sepsis -need to start diuretics when sepsis is controlled  Diarrhea -Check C. Difficile  Essential hypertension: Blood pressure 153/69.  Patient is not taking medications -Start amlodipine 5 mg daily -IV hydralazine as needed  HLD (hyperlipidemia) -Lipitor  Hypokalemia: Potassium 3.3, magnesium 2.2 -Repleted potassium  History of stroke ([place]): -Aspirin, Lipitor     DVT ppx: SQ Heparin    Code Status: Full code Family Communication:  Yes, patient's  wife by phone Disposition Plan:  Anticipate discharge back to previous environment Consults called:  Dr. Johnney Killian of neurosurgery  Also consulted Dr. Maryelizabeth Kaufmann of IR Michaelle Birks, MD [facility]   Pager. [phone] Clinic. [phone]  Admission status and Level of care: Telemetry Medical:   as inpt     Status is: Inpatient  Remains inpatient appropriate because: Patient has multiple comorbidities, now presents with compression fracture of L1 vertebral antibody, patient has significant pain, cannot walk normally.  Patient also has sepsis due to bilateral lower leg cellulitis.  His  presentation is highly complicated.  Patient is at high risk of deteriorating we need to be treated in hospital for at least 2 days.     Date of Service 05/06/2021    [facility]   If 7PM-7AM, please contact night-coverage [email or web address] 05/06/2021, 6:47 PM

## 2021-05-06 NOTE — Progress Notes (Signed)
Neurosurgery brief note  Was contacted by the emergency room regarding this patient.  He is a 76 year old with a history of chronic low back pain hypertension arthritis previous stroke venous insufficiency with bilateral lower edema recently prescribed antibiotics for concern for possible lower extremity ulcers in the setting of venous stasis who presents to the emergency room with back pain. Lumbar spine plain film imaging was obtained which on lateral view and demonstrated a compression fracture of L1 of approximately 20 to 25% loss of height no retropulsion into the canal otherwise stable alignment.  The patient underwent a CT scan of the lumbar spine at my request to better evaluate this area.  The CT scan of the lumbar spine with radiology read pending on my review demonstrates again approximately 20 to 25% loss of height L1 compression fracture along the superior endplate.  There is no retropulsion of fragments into the canal and otherwise the alignment is stable. By report from the emergency room the patient is neurologically intact in his lower extremities with no bowel or bladder difficulty.  Preliminary recommendations to be pain control as needed and TLSO brace as needed for comfort.  Should the patient be discharged from the emergency room after pain control is achieved the patient can follow-up with the neurosurgery clinic as an outpatient.  Peter Garter. Madaline Brilliant, MD Neurosurgery

## 2021-05-06 NOTE — ED Triage Notes (Signed)
Pt via EMS from home. Pt c/o weakness since last night. EMS reports bilateral edema and warmth in his legs. Pt is supposed to be taking amoxicillin but pt stopped taking them for diarrhea.  Pt is A&OX4 and NAD Pt also c/o back pain but it is chronic in nature.

## 2021-05-06 NOTE — ED Provider Notes (Signed)
[facility] Provider Note    Event Date/Time   First MD Initiated Contact with Patient 05/06/21 1333     (approximate)   History   Back Pain and Weakness   HPI  Brandon Hooper is a 76 y.o. 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  Patient does not recall any injuries or falls and states it felt acutely worse today.  He is usually able to get out of the chair without too much difficulty but is able to do so today.  He denies any new headache, earache, sore throat, vomiting, chest pain, fevers, upper back pain or neck pain, rash, or any new pain in his legs which he states are always sore and swollen.  He has not had any incontinence or burning with urination.  Denies any stool incontinence.  He is not on steroids and has never had cancer.      Physical Exam  Triage Vital Signs: ED Triage Vitals  Enc Vitals Group     BP 05/06/21 0722 (!) 166/72     Pulse Rate 05/06/21 0722 94     Resp 05/06/21 0722 (!) 22     Temp 05/06/21 0722 98.1 F (36.7 C)     Temp Source 05/06/21 0722 Oral     SpO2 05/06/21 0722 100 %     Weight 05/06/21 0723 225 lb (102.1 kg)     Height 05/06/21 0723 6' (1.829 m)     Head Circumference --      Peak Flow --      Pain Score 05/06/21 0723 8     Pain Loc --      Pain Edu? --      Excl. in GC? --     Most recent vital signs: Vitals:   05/06/21 1047 05/06/21 1345  BP: (!) 141/73 (!) 153/69  Pulse: 94 94  Resp: 20 (!) 24  Temp:    SpO2: 95% 100%    General: Awake, appears moderately uncomfortable. CV:  Good peripheral  perfusion.  2+ radial pulses. Resp:  Normal effort.  Clear bilaterally.  Slightly tachypneic. Abd:  Distended with easily reducible umbilical hernia.  Soft throughout Other:  Bilateral lower extremity edema with some ulcerations and a little bit of green discharge from these ulcerations.  There is some erythema around this.  No significant streaking or bleeding.  Patient is able to flex and extend the bilateral hips and knees with symmetric strength.  Sensation is intact light touch throughout the bilateral lower extremities.   ED Results / Procedures / Treatments  Labs (all labs ordered are listed, but only abnormal results are displayed) Labs Reviewed  BASIC METABOLIC PANEL - Abnormal; Notable for the following components:      Result Value   Potassium 3.3 (*)    Calcium 8.6 (*)    All other components within normal limits  CBC - Abnormal; Notable for the following components:   WBC 12.4 (*)    RBC 4.11 (*)    Hemoglobin 12.3 (*)    HCT 37.6 (*)    All other components within normal limits  HEPATIC  FUNCTION PANEL - Abnormal; Notable for the following components:   Albumin 3.3 (*)    AST 68 (*)    Total Bilirubin 1.7 (*)    Bilirubin, Direct 0.4 (*)    Indirect Bilirubin 1.3 (*)    All other components within normal limits  TROPONIN I (HIGH SENSITIVITY) - Abnormal; Notable for the following components:   Troponin I (High Sensitivity) 22 (*)    All other components within normal limits  AEROBIC/ANAEROBIC CULTURE W GRAM STAIN (SURGICAL/DEEP WOUND)  LIPASE, BLOOD  MAGNESIUM  URINALYSIS, ROUTINE W REFLEX MICROSCOPIC  PROCALCITONIN  TROPONIN I (HIGH SENSITIVITY)     EKG  ECG is remarkable sinus rhythm with a ventricular rate of 92, normal axis, unremarkable intervals without evidence of acute ischemia or significant arrhythmia.   RADIOLOGY  Chest reviewed by myself shows no focal consoidation, effusion, edema, pneumothorax or other clear acute thoracic process. I also  reviewed radiology interpretation and agree with findings described.  X-ray of the C-spine reviewed by myself shows no acute fracture or spondylolisthesis.  There is some degenerative changes noted.  Also reviewed radiologist interpretation and agree with the findings.  X-ray L-spine shows compression fracture of L1 that appears new when compared to prior.  No other acute traumatic process noted.  This was reviewed interpreted by myself.  Also reviewed radiology's findings  Plain film of  sacrum and coccyx ordered and reviewed by myself shows no acute fracture.  Also reviewed radiology's interpretation and agree with their findings.   PROCEDURES:  Critical Care performed: No  Procedures   MEDICATIONS ORDERED IN ED: Medications  lidocaine (LIDODERM) 5 % 1 patch (1 patch Transdermal Patch Applied 05/06/21 1510)  ciprofloxacin (CIPRO) tablet 500 mg (has no administration in time range)  potassium chloride SA (KLOR-CON M) CR tablet 40 mEq (40 mEq Oral Given 05/06/21 1520)  acetaminophen (TYLENOL) tablet 1,000 mg (1,000 mg Oral Given 05/06/21 1520)  naproxen (NAPROSYN) tablet 500 mg (500 mg Oral Given 05/06/21 1520)     IMPRESSION / MDM / ASSESSMENT AND PLAN / ED COURSE  I reviewed the triage vital signs and the nursing notes.                              Differential diagnosis includes, but is not limited to possible atypical presentation for ACS, acute on chronic low back pain related to herniated disc, stenosis or possible pathologic fracture.  ECG is remarkable sinus rhythm with a ventricular rate of 92, normal axis, unremarkable intervals without evidence of acute ischemia or significant arrhythmia.  Troponin slightly elevated 22 possibly related to demand in the setting of fairly acute pain patient is denying any chest pain and has a relatively reassuring EKG.  I will plan to obtain a delta troponin to assess for stability.  Lipase not consistent with pancreatitis.  Hepatic function  panel shows low albumin and slightly elevated AST but no other significant acute derangements.  BMP shows a K of 3.3 without any other significant electrolyte or metabolic derangements.  CBC shows WBC count of 12.4 possibly reactive in the setting of acute pain or trauma to both L-spine patient is denying any infectious symptoms.  Hemoglobin is 12.3.  Chest reviewed by myself shows no focal consoidation, effusion, edema, pneumothorax or other clear acute thoracic process. I also reviewed radiology interpretation and agree with findings described.  X-ray of the C-spine reviewed by myself shows no acute fracture or spondylolisthesis.  There is some degenerative changes noted.  Also reviewed radiologist interpretation and agree with the findings.  X-ray L-spine shows compression fracture of L1 that appears new when compared to prior.  No other acute traumatic process noted.  This was reviewed interpreted by myself.  Also reviewed radiology's findings  Plain film of  sacrum and coccyx ordered and reviewed by myself shows no acute fracture.  Also reviewed radiology's interpretation and agree with their findings.  Given concern for lower extremity cellulitis in the setting of chronic edema with some greenish discharge concerning for Pseudomonas we will start patient on ciprofloxacin.  Given concern for an acute L-spine fracture I did discuss patient with on-call neurosurgeon Dr. Madaline Brilliantodd who recommended obtaining a CT L-spine and following this likely placing patient in an LSO brace.  Patient will subsequently be evaluated for pain control if he is unable to stand or ambulate and has not had adequate pain control likely be admitted for PT OT and pain control.  Care patient signed over to assuming provider at approximately 1530 with plan to follow-up CT and follow-up with neurosurgery regarding results of CT as well as reassess patient.      FINAL CLINICAL IMPRESSION(S) / ED DIAGNOSES   Final diagnoses:   Hypokalemia  Acute midline low back pain without sciatica  Cellulitis of lower extremity, unspecified laterality  Compression fracture of L1 vertebra, initial encounter (HCC)  Lower extremity edema     Rx / DC Orders   ED Discharge Orders     None        Note:  This document was prepared using Dragon voice recognition software and may include unintentional dictation errors.   Gilles ChiquitoSmith, Gloriana Piltz P, MD 05/06/21 1534

## 2021-05-06 NOTE — Progress Notes (Signed)
Orthopedic Tech Progress Note Patient Details:  Brandon Hooper 11-17-1945 [id number]  Patient ID: [name], male   DOB: [birth year], 76 y.o.   MRN: [id number] Called order into [facility] 05/06/2021, 8:26 PM

## 2021-05-06 NOTE — ED Notes (Signed)
Bilateral leg dressings removed per MD Katrinka Blazing

## 2021-05-06 NOTE — ED Notes (Signed)
Called lab to request phlebotomist to collect second blood culture. Lab technician informed patient currently being transported to the floor.

## 2021-05-06 NOTE — ED Provider Notes (Signed)
----------------------------------------- °  3:12 PM on 05/06/2021 -----------------------------------------  Blood pressure (!) 153/69, pulse 94, temperature 98.1 F (36.7 C), temperature source Oral, resp. rate (!) 24, height 6' (1.829 m), weight 102.1 kg, SpO2 100 %.  Assuming care from Dr. Tamala Julian.  In short, Brandon Hooper is a 76 y.o. male with a chief complaint of Back Pain and Weakness .  Refer to the original H&P for additional details.  The current plan of care is to follow-up imaging for low back pain and generalized weakness.  ----------------------------------------- 4:38 PM on 05/06/2021 ----------------------------------------- CT scan of lumbar spine shows acute compression fracture of L1 vertebra with mild retropulsion and mild canal stenosis.  Dr. Johnney Killian of neurosurgery was consulted by Dr. Tamala Julian and we will place patient in TLSO brace.  He will require admission due to difficulty ambulating secondary to pain.  Patient also noted to have difficulty urinating with greater than 1 L found in bladder on bladder scan.  Foley catheter was placed with improvement.  Case discussed with hospitalist for admission.    Blake Divine, MD 05/06/21 1640

## 2021-05-06 NOTE — Progress Notes (Signed)
Pharmacy Antibiotic Note  Brandon Hooper is a 76 y.o. male admitted on 05/06/2021. Pharmacy has been consulted for vancomycin dosing for cellulitis.  Plan: Vancomycin 2500 mg IV x 1 loading dose followed by 1500 mg IV q12h  Est AUC: 520 Used: Scr 0.81, IBW, Vd 0.72 Obtain vanc level around 4th or 5th dose if continued Monitor renal function and adjust dose as clinically indicated   Height: 6' (182.9 cm) Weight: 102.1 kg (225 lb) IBW/kg (Calculated) : 77.6  Temp (24hrs), Avg:98.1 F (36.7 C), Min:98 F (36.7 C), Max:98.1 F (36.7 C)  Recent Labs  Lab 05/06/21 0728  WBC 12.4*  CREATININE 0.81    Estimated Creatinine Clearance: 100.4 mL/min (by C-G formula based on SCr of 0.81 mg/dL).    Allergies  Allergen Reactions   Erythromycin Hives   Ofloxacin     Other reaction(s): Other (See Comments) "Arthritic pain". The patient reported 5 years of left hip pain when he last took ofloxacin. Likely could try another fluoroquinolone.    Cephalexin Rash    The patient previously tolerated cephalexin in 2013 and reported in 2019 that he developed redness and itching of his knees while on cephalexin. Not entirely clear it was a true allergic reaction.    Clindamycin Hcl Rash   Doxycycline Calcium Rash   Sulfamethoxazole-Trimethoprim Rash   Tetracycline Rash    Antimicrobials this admission: 1/28 vancomycin >>    Thank you for allowing pharmacy to be a part of this patients care.  Forde Dandy Rizwan Kuyper 05/06/2021 5:45 PM

## 2021-05-07 ENCOUNTER — Inpatient Hospital Stay: Payer: Medicare Other

## 2021-05-07 LAB — CBC
HCT: 34.7 % — ABNORMAL LOW (ref 39.0–52.0)
Hemoglobin: 11.6 g/dL — ABNORMAL LOW (ref 13.0–17.0)
MCH: 30 pg (ref 26.0–34.0)
MCHC: 33.4 g/dL (ref 30.0–36.0)
MCV: 89.7 fL (ref 80.0–100.0)
Platelets: 285 10*3/uL (ref 150–400)
RBC: 3.87 MIL/uL — ABNORMAL LOW (ref 4.22–5.81)
RDW: 14.2 % (ref 11.5–15.5)
WBC: 10.3 10*3/uL (ref 4.0–10.5)
nRBC: 0 % (ref 0.0–0.2)

## 2021-05-07 LAB — C-REACTIVE PROTEIN: CRP: 7.6 mg/dL — ABNORMAL HIGH (ref <1.0)

## 2021-05-07 LAB — BASIC METABOLIC PANEL
Anion gap: 7 (ref 5–15)
BUN: 14 mg/dL (ref 8–23)
CO2: 22 mmol/L (ref 22–32)
Calcium: 8 mg/dL — ABNORMAL LOW (ref 8.9–10.3)
Chloride: 104 mmol/L (ref 98–111)
Creatinine, Ser: 0.53 mg/dL — ABNORMAL LOW (ref 0.61–1.24)
GFR, Estimated: >60 mL/min (ref >60)
Glucose, Bld: 97 mg/dL (ref 70–99)
Potassium: 3.5 mmol/L (ref 3.5–5.1)
Sodium: 133 mmol/L — ABNORMAL LOW (ref 135–145)

## 2021-05-07 IMAGING — CR DG ABDOMEN ACUTE W/ 1V CHEST
1 series · 5 of 5 positions shown · non-contrast
Comparison: None.

CLINICAL DATA: Abdominal pain and distention.

EXAM:
DG ABDOMEN ACUTE WITH 1 VIEW CHEST

[Series 1: dg abd acute 2+v w 1v chest · 0.14mm/px · 5 of 5 slices shown]
[im 1/5]
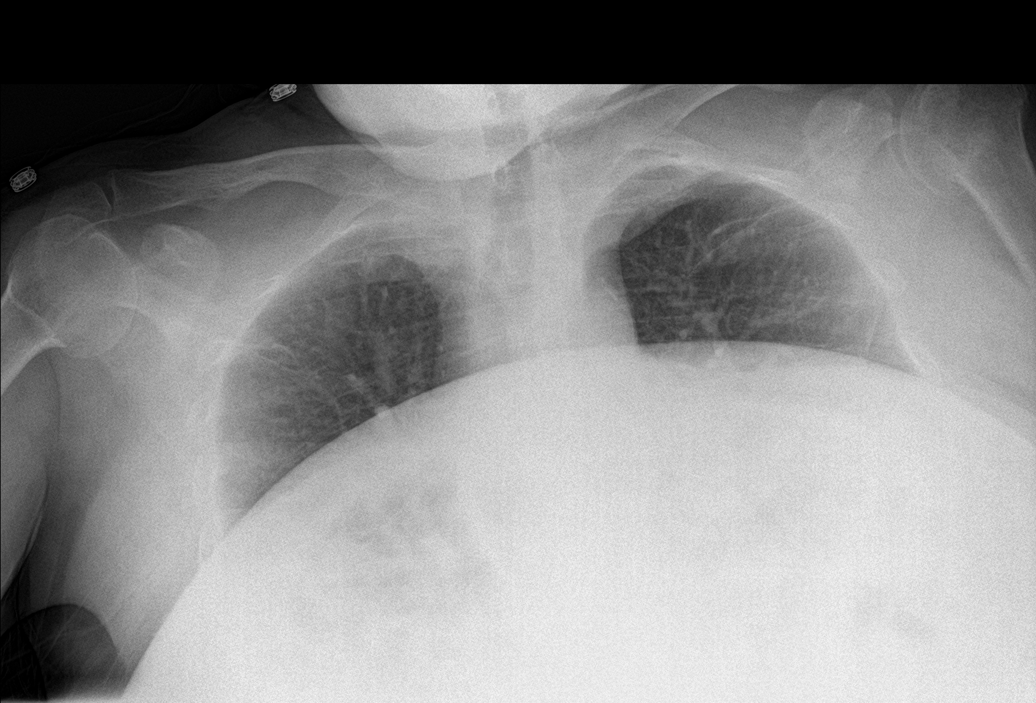
[im 2/5]
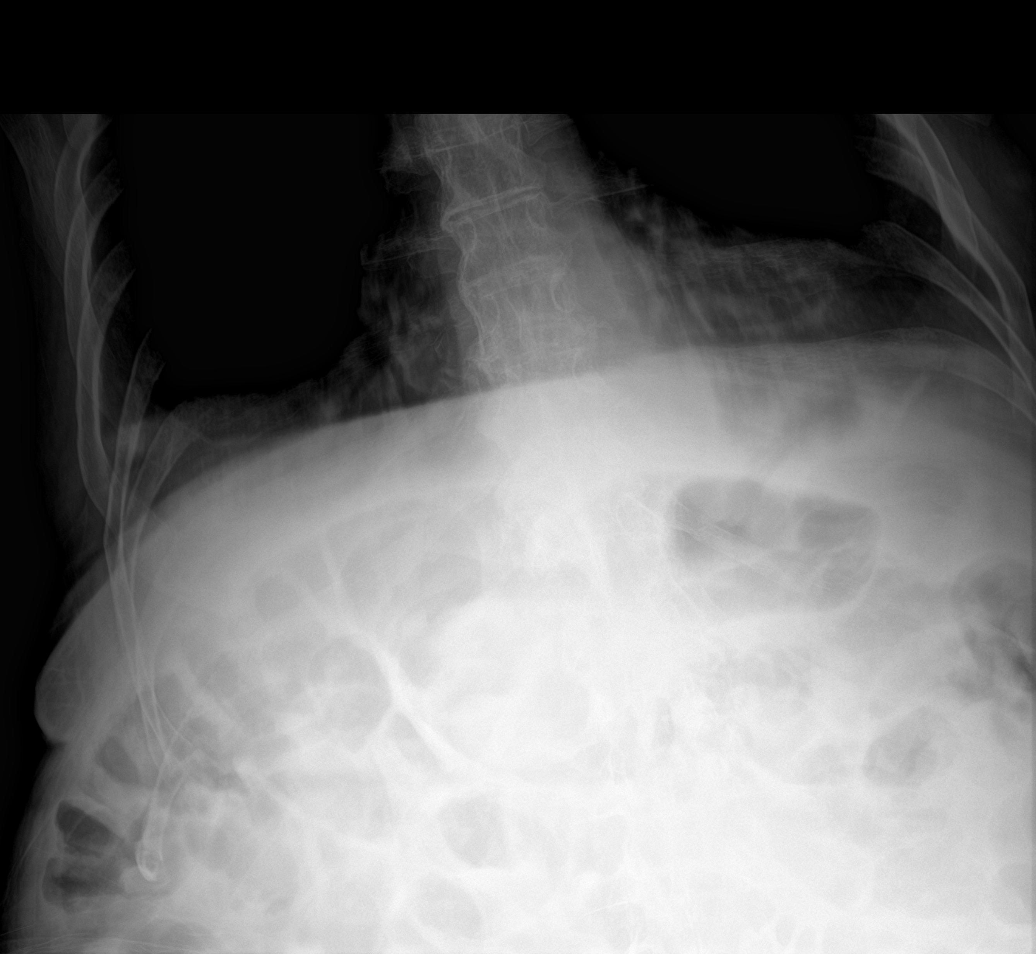
[im 3/5]
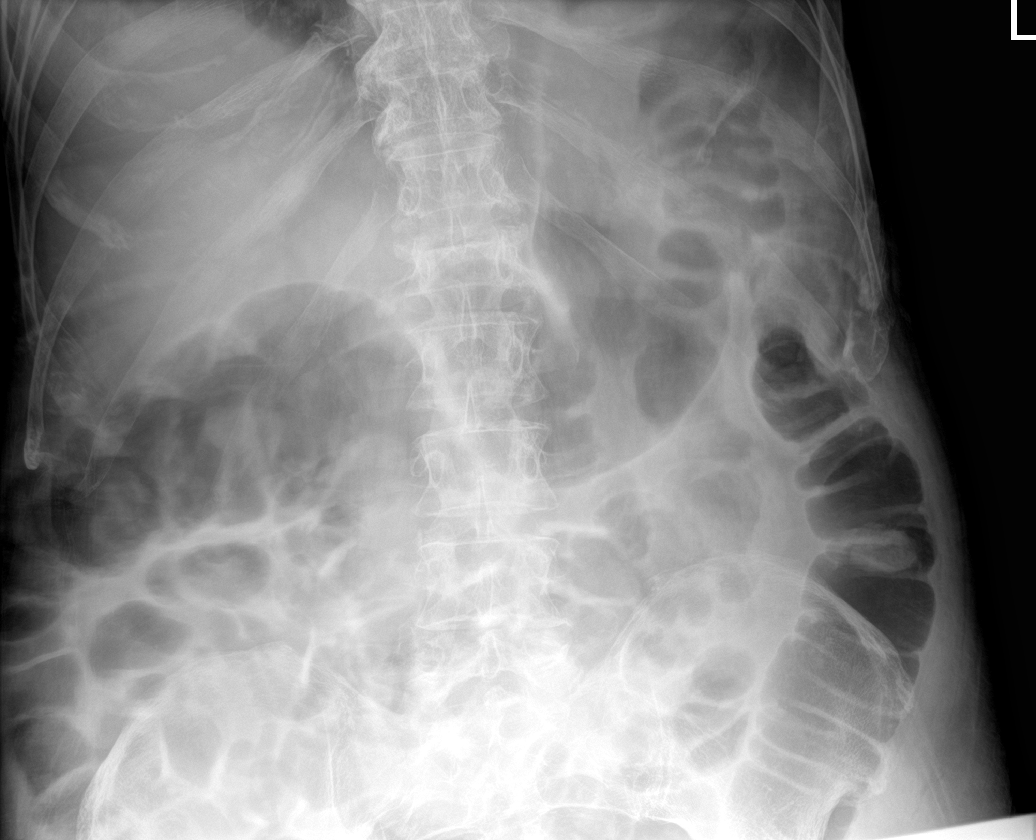
[im 4/5]
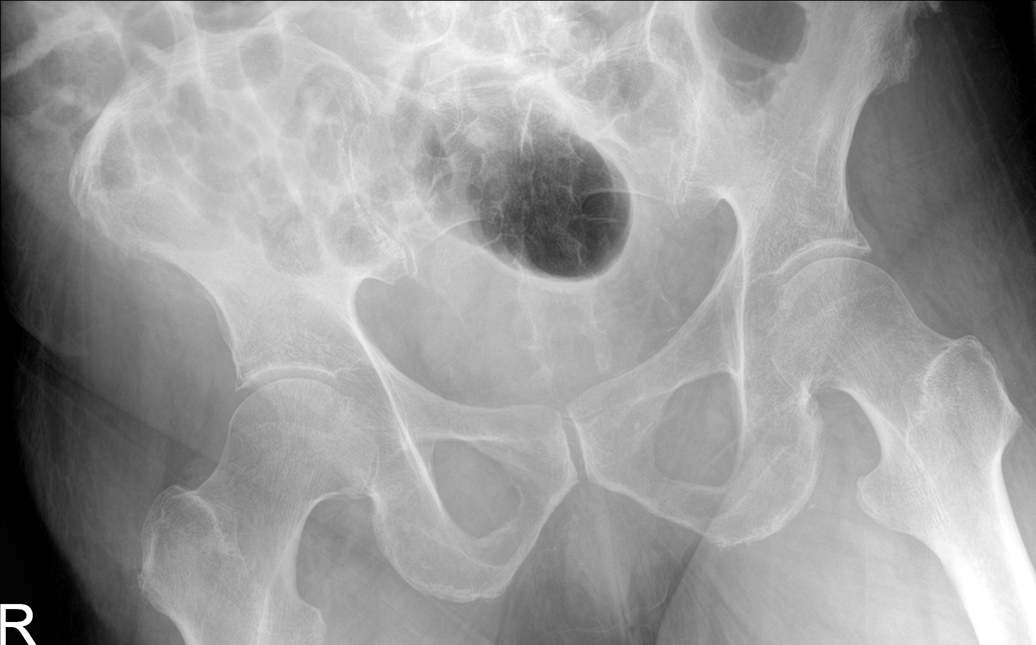
[im 5/5]
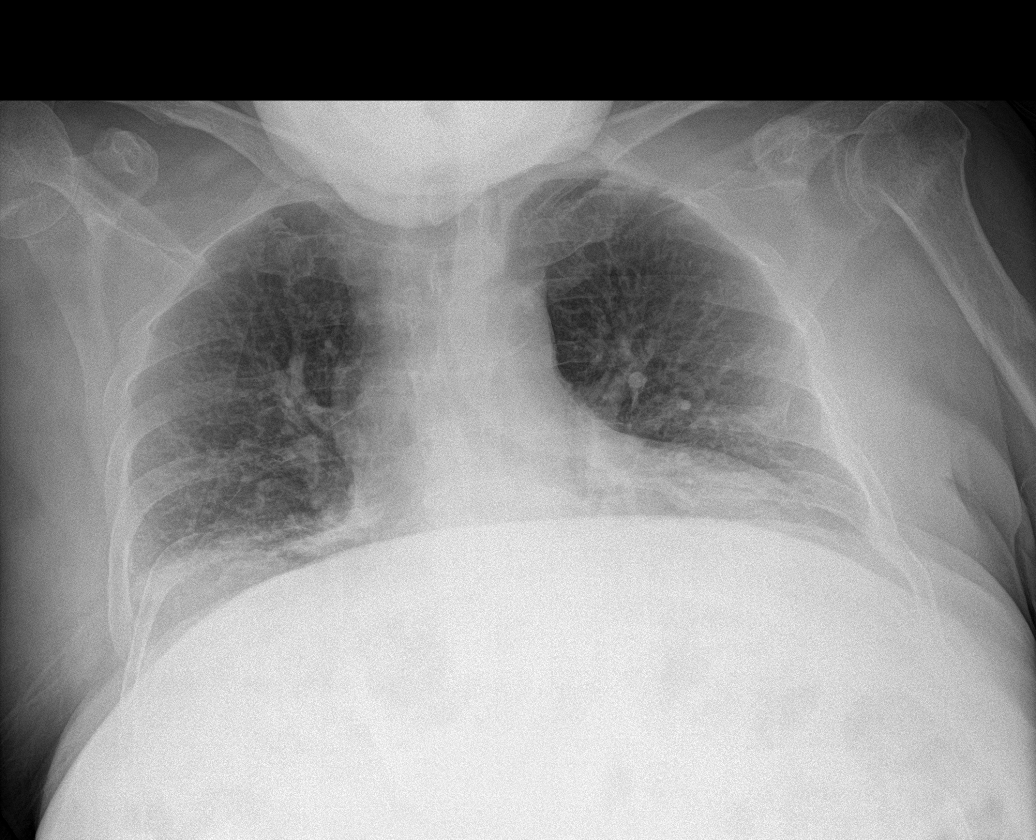

[5 of 5 positions shown; findings below may reference images not displayed]

FINDINGS: There is no evidence of dilated bowel loops or free intraperitoneal
air. No radiopaque calculi or other significant radiographic
abnormality is seen. Heart size and mediastinal contours are within
normal limits. Both lungs are clear.
IMPRESSION: Negative abdominal radiographs.  No acute cardiopulmonary disease.

## 2021-05-07 IMAGING — US US EXTREM LOW VENOUS
1 series · 13 of 24 positions shown · non-contrast
Comparison: CT L-spine, [DATE]. LEFT lower extremity venous
duplex report, [DATE]

CLINICAL DATA: Bilateral lower extremity cellulitis



[Series 1: us venous img lower bilat (dvt) · portal-venous · 13 of 56 slices shown]
[im 1/56]
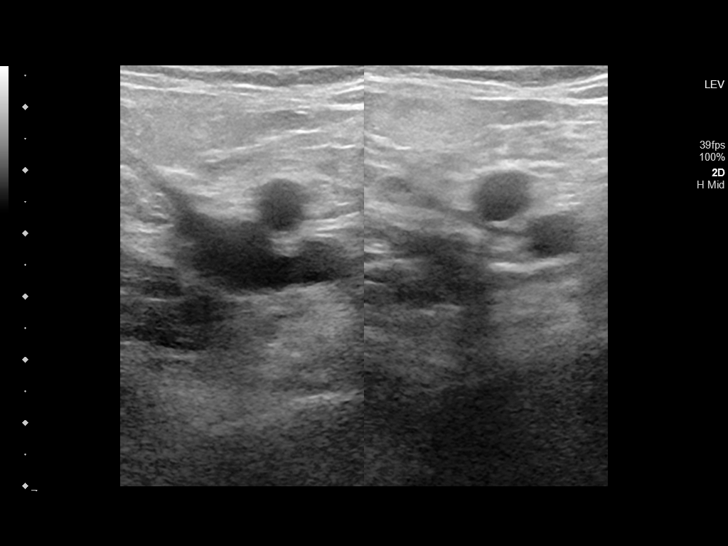
[im 5/56]
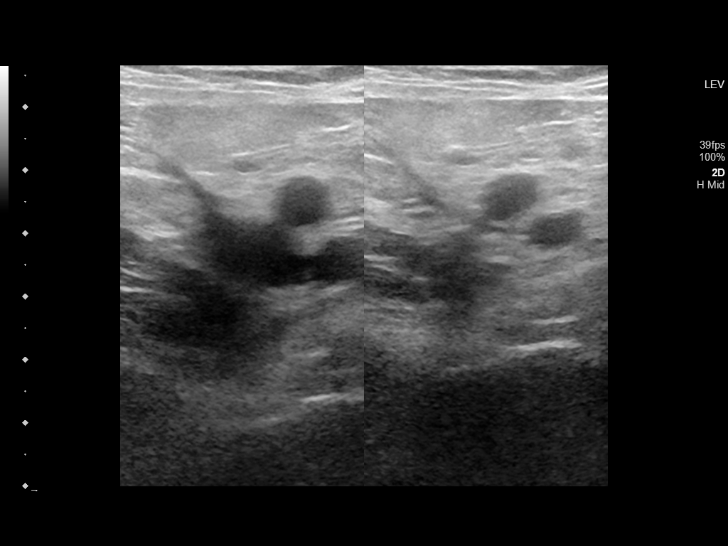
[im 10/56]
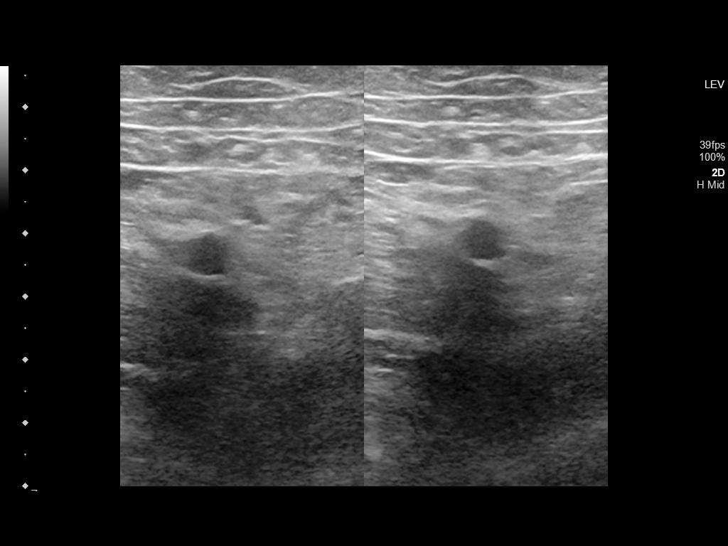
[im 15/56]
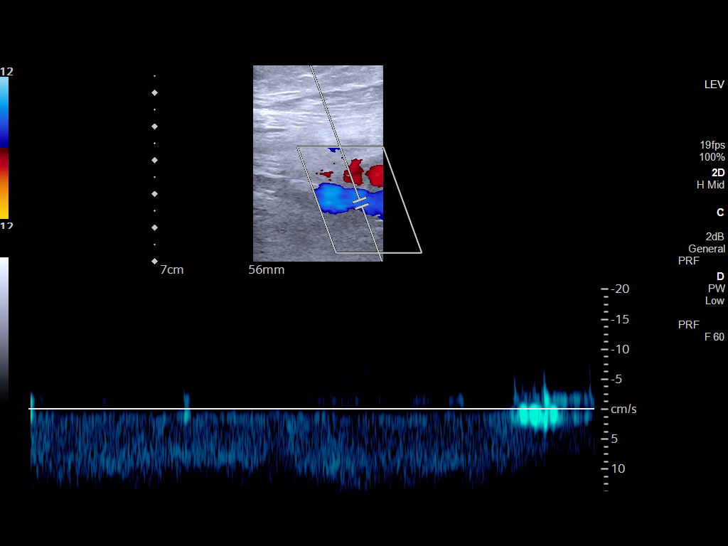
[im 20/56]
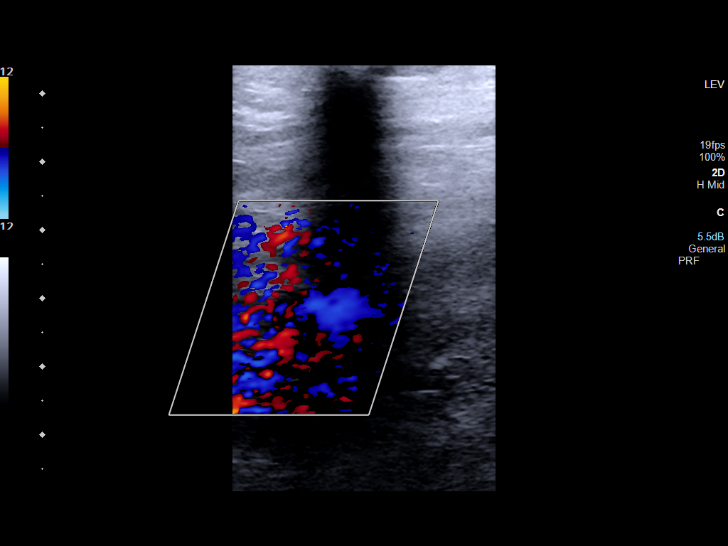
[im 24/56]
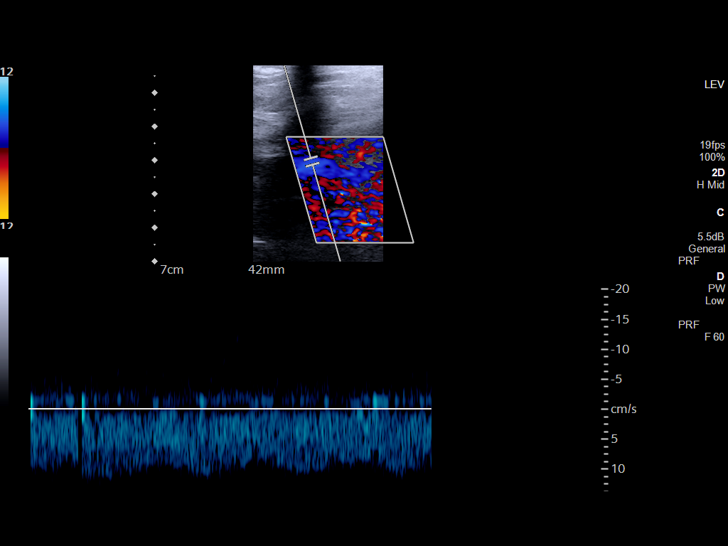
[im 29/56]
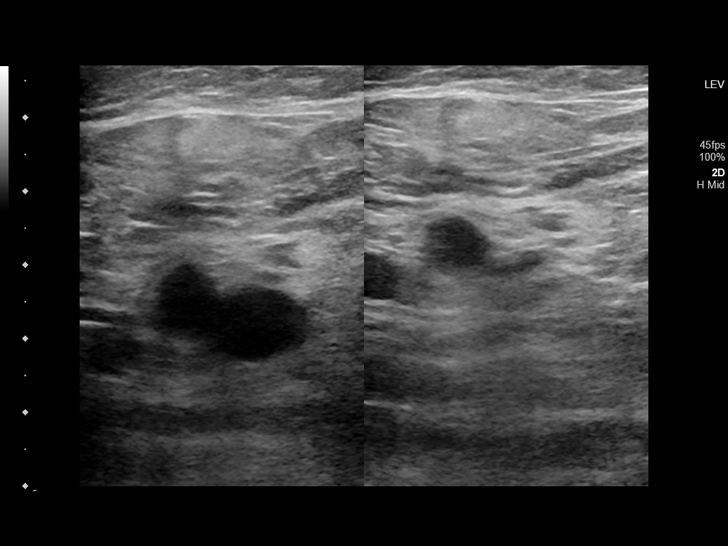
[im 32/56]
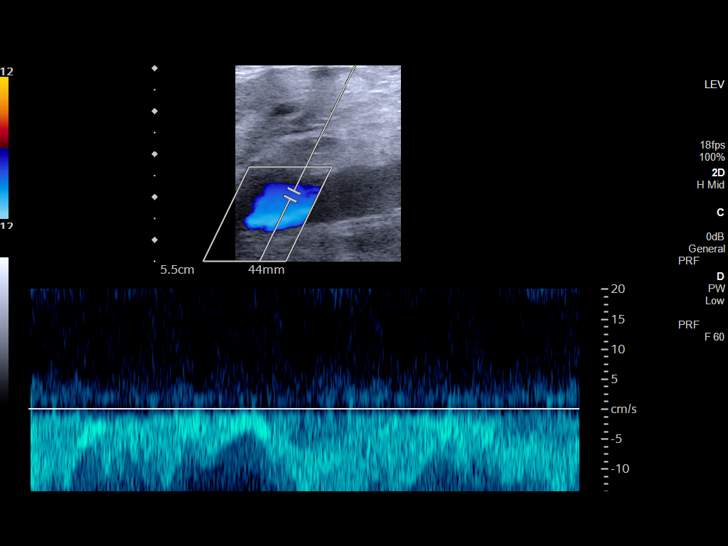
[im 36/56]
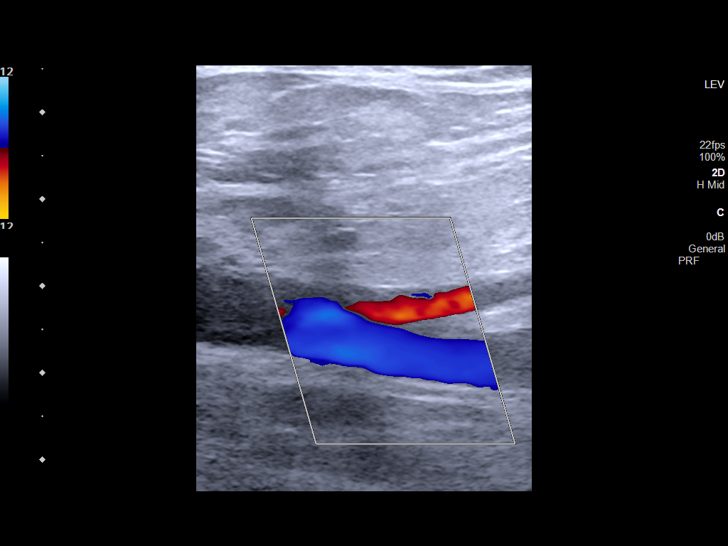
[im 41/56]
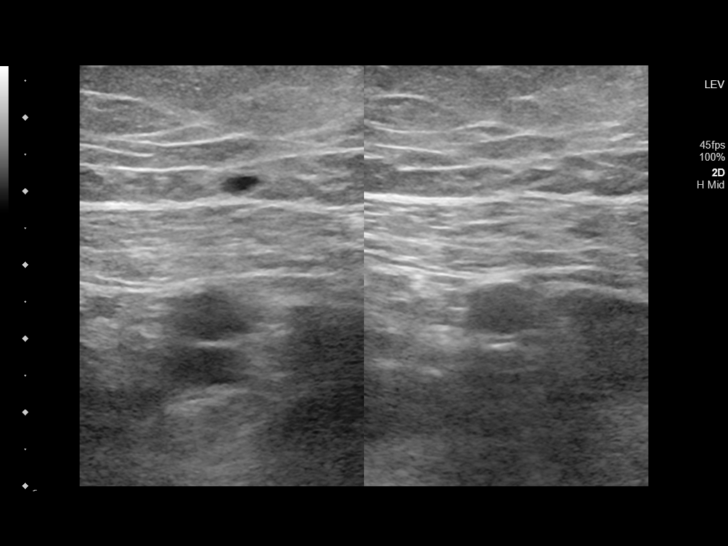
[im 46/56]
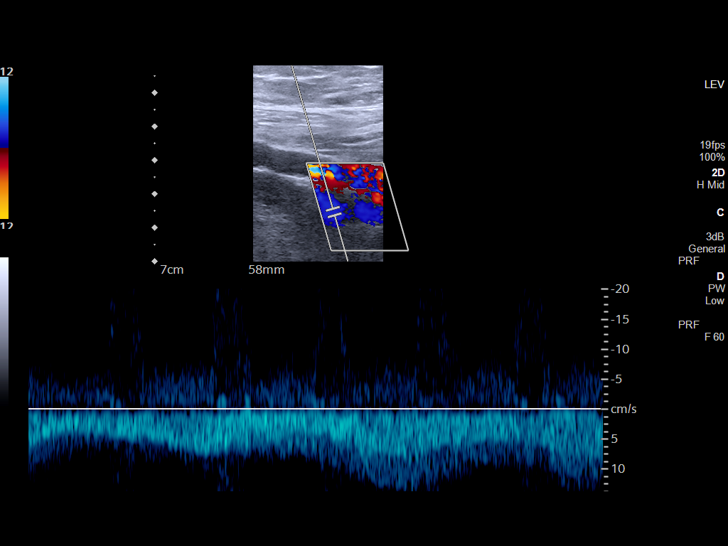
[im 51/56]
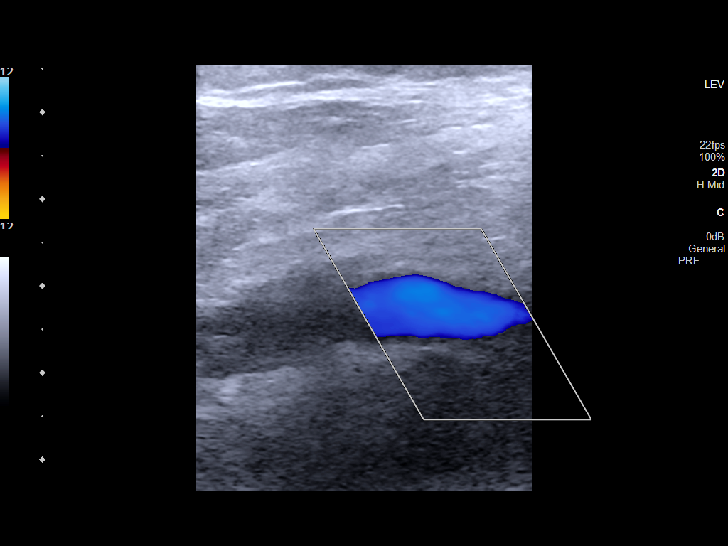
[im 56/56]
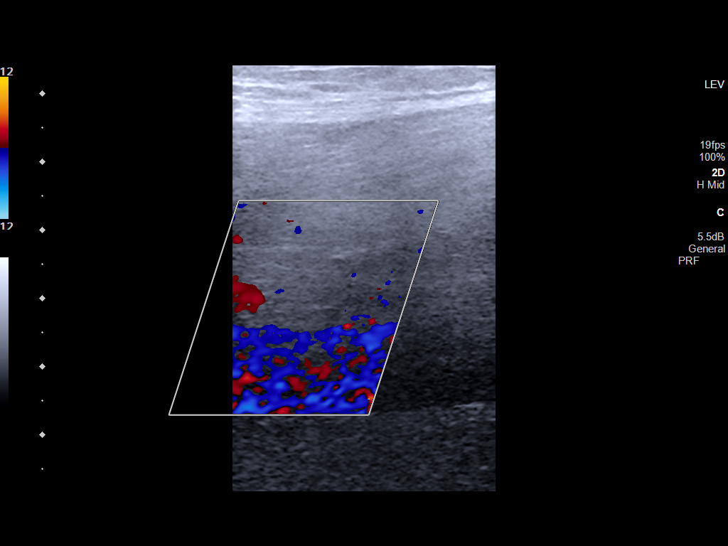

[13 of 24 positions shown; findings below may reference images not displayed]

FINDINGS: Suboptimal evaluation, with poor acoustic penetration secondary to
patient habitus.

RIGHT LOWER EXTREMITY

VENOUS

Normal compressibility of the RIGHT common femoral, superficial
femoral, and popliteal veins, as well as the visualized calf veins.
Visualized portions of profunda femoral vein and great saphenous
vein unremarkable. No filling defects to suggest DVT on grayscale or
color Doppler imaging. Doppler waveforms show normal direction of
venous flow, normal respiratory plasticity and response to
augmentation.

OTHER

No evidence of superficial thrombophlebitis or abnormal fluid
collection.

Limitations: none

LEFT LOWER EXTREMITY

VENOUS

Normal compressibility of the LEFT common femoral, superficial
femoral, and popliteal veins, as well as the visualized calf veins.
Visualized portions of profunda femoral vein and great saphenous
vein unremarkable. No filling defects to suggest DVT on grayscale or
color Doppler imaging. Doppler waveforms show normal direction of
venous flow, normal respiratory plasticity and response to
augmentation.

OTHER

No evidence of superficial thrombophlebitis or abnormal fluid
collection.

Limitations: none
IMPRESSION: Suboptimal evaluation, within these constraints;

No evidence of femoropopliteal DVT within either lower extremity.

## 2021-05-07 IMAGING — MR MR LUMBAR SPINE WO/W CM
6 of 7 series · 31 of 48 positions shown · IV contrast (gadavist)
Comparison: Lumbar spine CT [DATE]

CLINICAL DATA: L1 fracture

EXAM:
MRI LUMBAR SPINE WITHOUT AND WITH CONTRAST
TECHNIQUE: Multiplanar and multiecho pulse sequences of the lumbar spine were
obtained without and with intravenous contrast.
CONTRAST:  10mL GADAVIST GADOBUTROL 1 MMOL/ML IV SOLN

[Series 5: T2 · sagittal · 4.0mm · 0.81mm/px · 5 of 17 slices shown (1 of 2)]
[im 1/17]
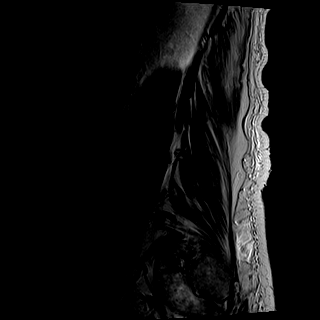
[im 5/17]
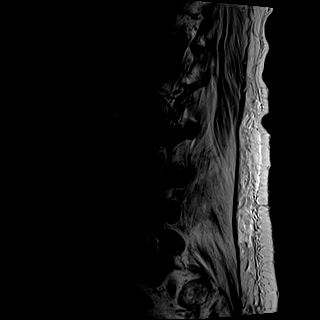
[im 9/17]
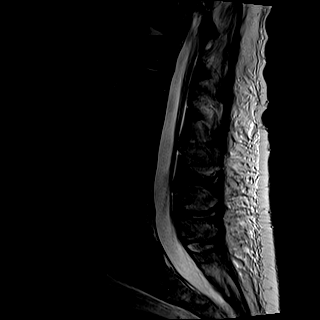
[im 13/17]
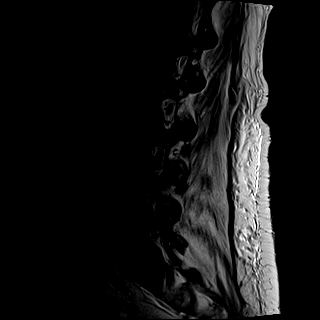
[im 17/17]
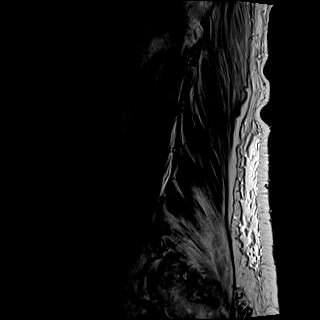

[Series 6: T1 · sagittal · 4.0mm · 0.81mm/px · 5 of 17 slices shown (1 of 2)]
[im 1/17]
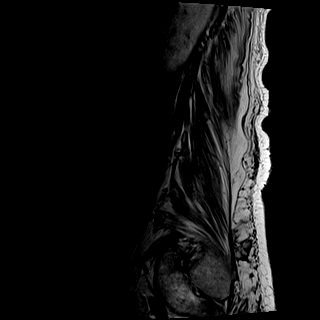
[im 5/17]
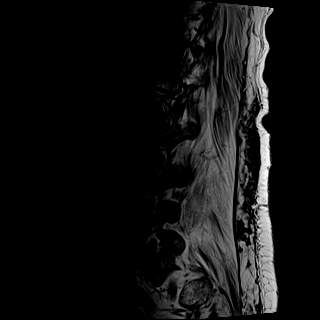
[im 9/17]
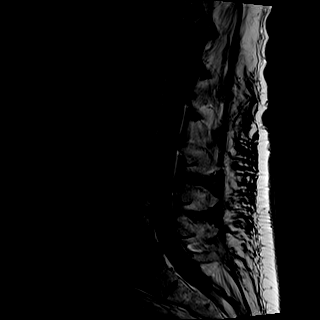
[im 13/17]
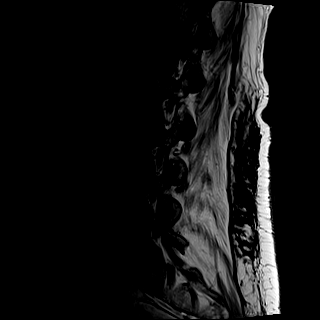
[im 17/17]
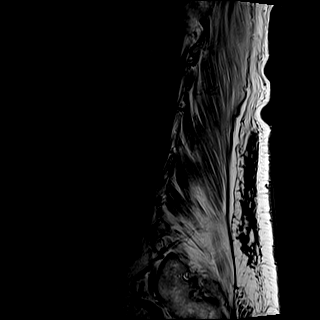

[Series 7: STIR · sagittal · 4.0mm · 0.41mm/px · 1 of 17 slices shown]
[im 1/17]
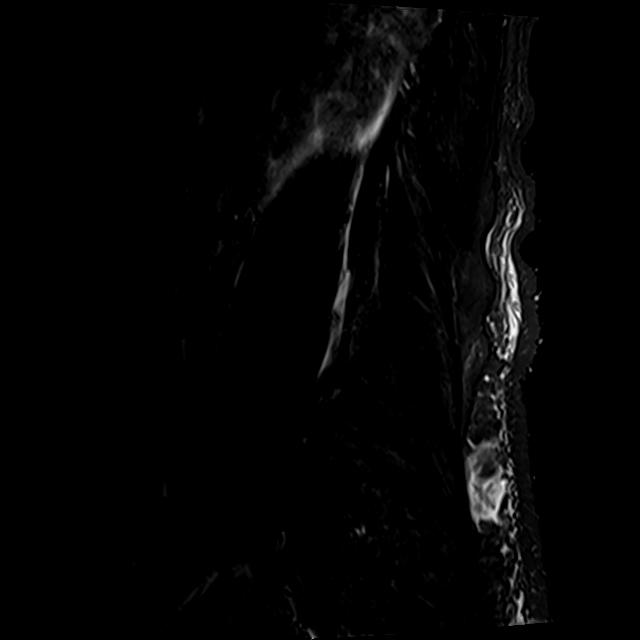

[Series 8: T2 · axial · 4.0mm · 0.78mm/px · z∈[-43,+197]mm · 8 of 40 slices shown (2 of 2)]
[im 1/40]
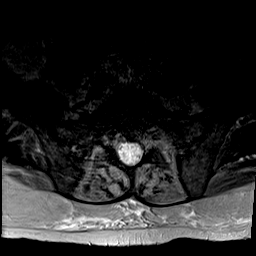
[im 5/40]
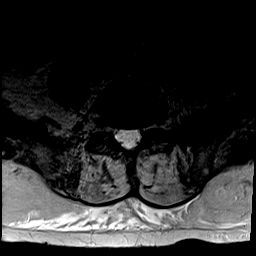
[im 14/40]
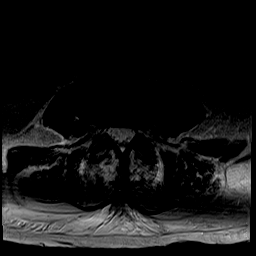
[im 18/40]
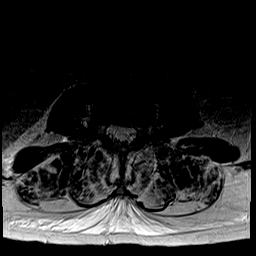
[im 22/40]
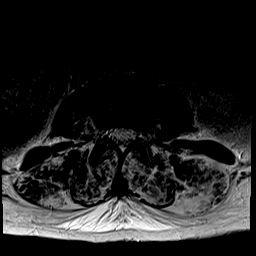
[im 27/40]
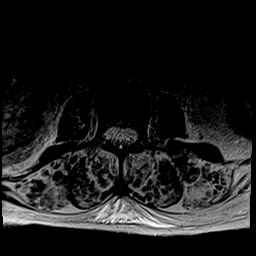
[im 35/40]
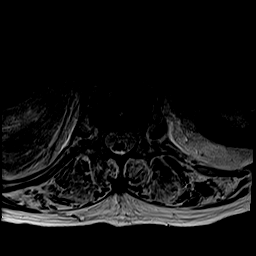
[im 40/40]
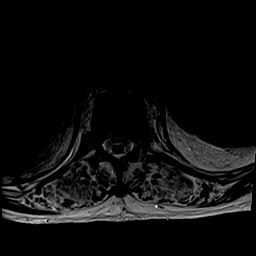

[Series 9: T1 · axial · 4.0mm · 0.39mm/px · z∈[-43,+197]mm · 8 of 40 slices shown (2 of 2)]
[im 1/40]
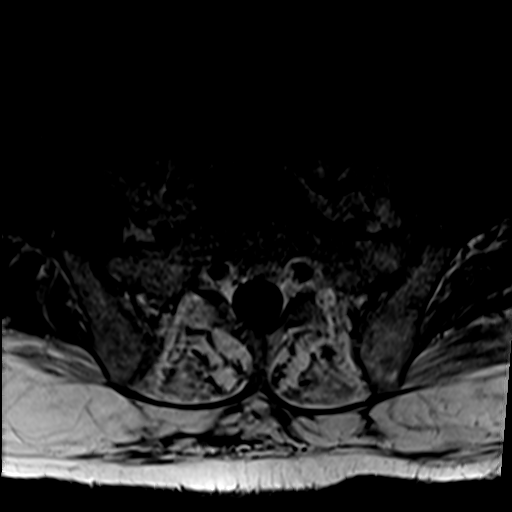
[im 5/40]
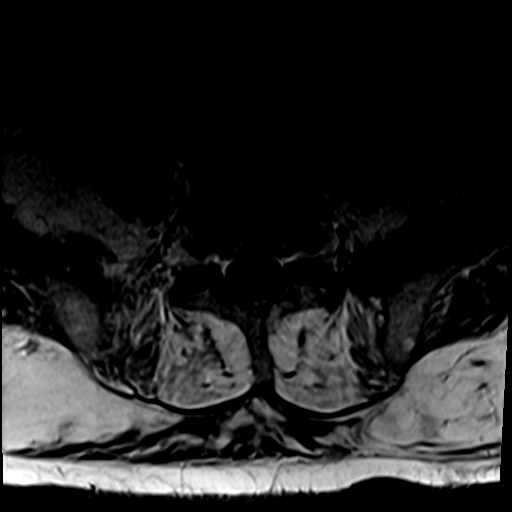
[im 14/40]
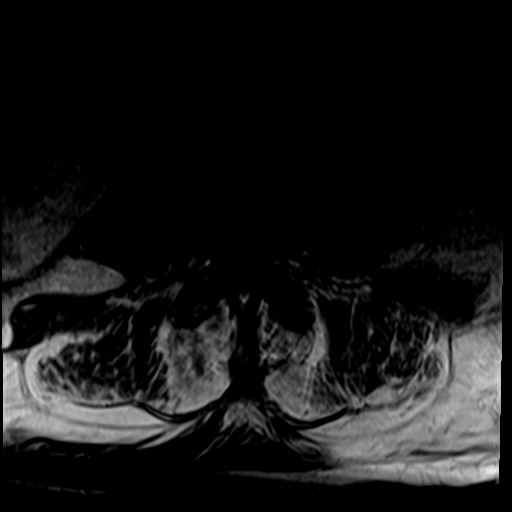
[im 18/40]
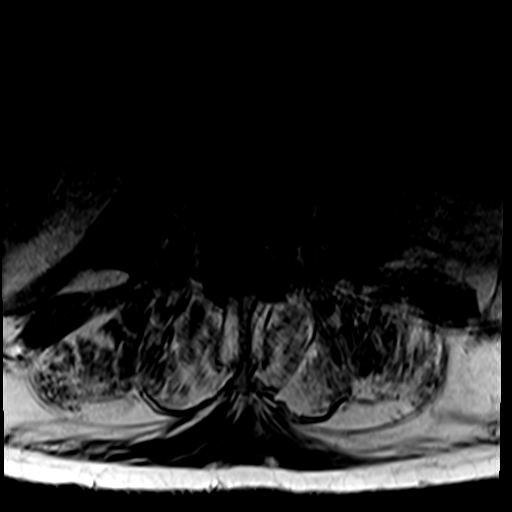
[im 22/40]
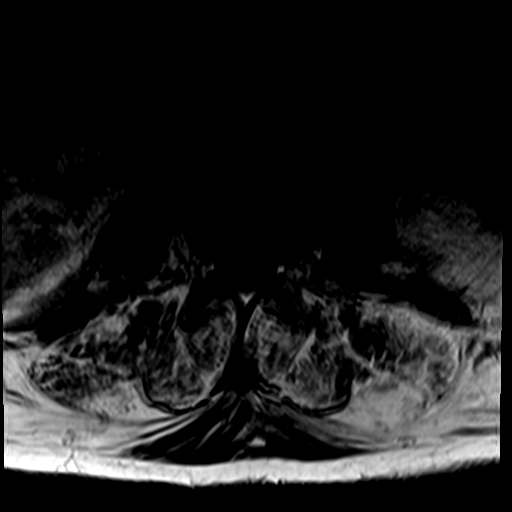
[im 27/40]
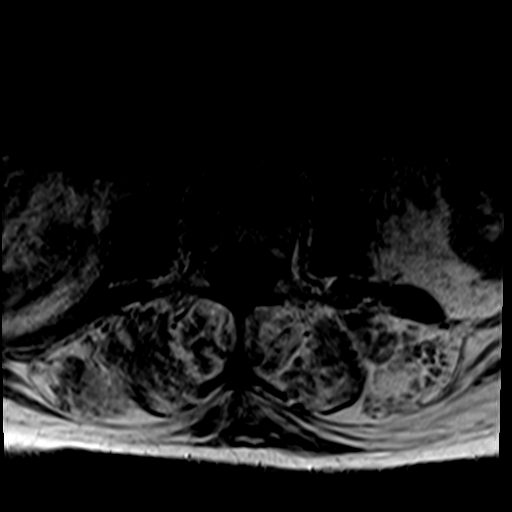
[im 35/40]
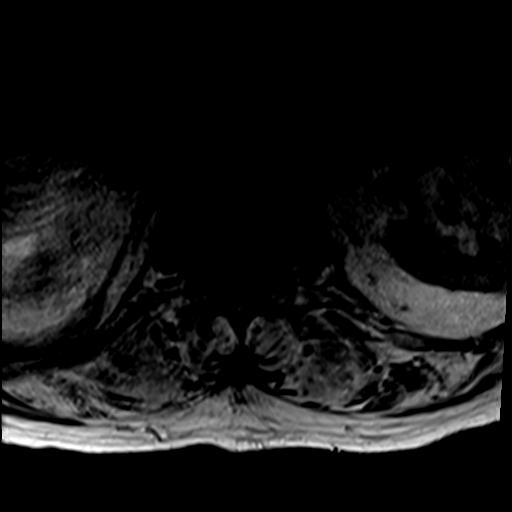
[im 40/40]
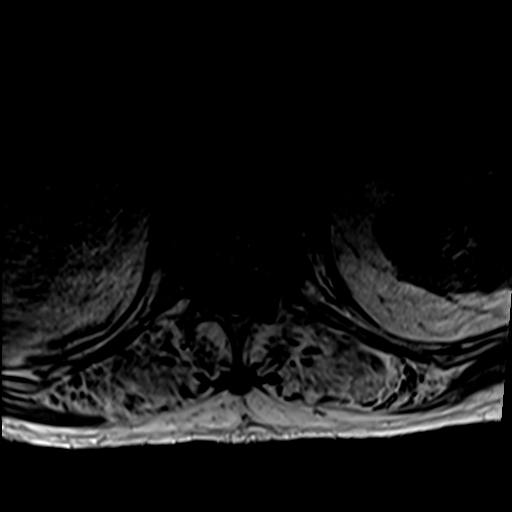

[Series 10: T1 fat-sat post-contrast · sagittal · 4.0mm · 0.81mm/px · 4 of 17 slices shown]
[im 1/17]
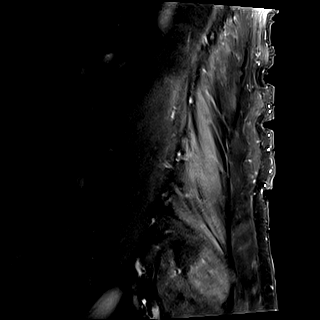
[im 6/17]
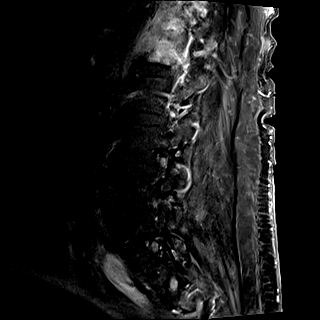
[im 11/17]
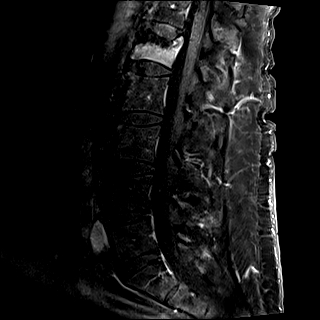
[im 17/17]
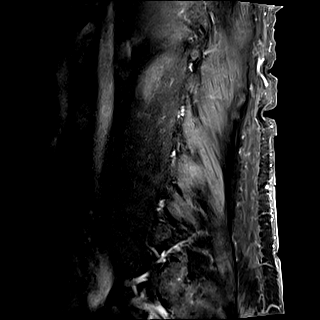

[31 of 48 positions shown; findings below may reference images not displayed]

FINDINGS: Segmentation:  Standard

Alignment:  Normal

Vertebrae: Mild wedge compression fracture of L1 with less than 25%
anterior height loss. Contrast enhancement within the vertebral
bodies likely reactive. There is hyperintense T2-weighted signal
underlying the superior endplate suggesting subacute timeline.

Conus medullaris and cauda equina: Conus extends to the L1 level.
Conus and cauda equina appear normal.

Paraspinal and other soft tissues: Fatty atrophy of the paraspinous
muscles

Disc levels:

T12-L1: Minimal retropulsion of the L1 superior endplate fracture
without spinal canal or neural foraminal stenosis.

L1-L2: Normal disc space and facet joints. No spinal canal stenosis.
No neural foraminal stenosis.

L2-L3: Normal disc space and facet joints. No spinal canal stenosis.
No neural foraminal stenosis.

L3-L4: Normal disc space and facet joints. No spinal canal stenosis.
No neural foraminal stenosis.

L4-L5: Small left asymmetric disc bulge. No spinal canal stenosis.
No neural foraminal stenosis.

L5-S1: Disc desiccation without herniation. No spinal canal
stenosis. No neural foraminal stenosis.

Visualized sacrum: Normal.
IMPRESSION: 1. Subacute wedge compression fracture of the L1 superior endplate
with less than 25% anterior height loss and minimal retropulsion. No
spinal canal or neural foraminal stenosis.
2. Mild L4-L5 and L5-S1 degenerative disc disease without spinal
canal or neural foraminal stenosis.

## 2021-05-07 MED ORDER — GADOBUTROL 1 MMOL/ML IV SOLN
10.0000 mL | Freq: Once | INTRAVENOUS | Status: AC | PRN
  Administered 2021-05-07: 10 mL via INTRAVENOUS

## 2021-05-07 MED ORDER — SIMETHICONE 80 MG PO CHEW
80.0000 mg | CHEWABLE_TABLET | Freq: Four times a day (QID) | ORAL | Status: DC | PRN
  Filled 2021-05-07: qty 1

## 2021-05-07 MED ORDER — CHLORHEXIDINE GLUCONATE CLOTH 2 % EX PADS
6.0000 | MEDICATED_PAD | Freq: Every day | CUTANEOUS | Status: DC
  Administered 2021-05-07 – 2021-05-09 (×3): 6 via TOPICAL

## 2021-05-07 MED ORDER — ENOXAPARIN SODIUM 60 MG/0.6ML IJ SOSY
50.0000 mg | PREFILLED_SYRINGE | Freq: Every day | INTRAMUSCULAR | Status: DC
  Administered 2021-05-07 – 2021-05-09 (×3): 50 mg via SUBCUTANEOUS
  Filled 2021-05-07 (×3): qty 0.6

## 2021-05-07 MED ORDER — POTASSIUM CHLORIDE 20 MEQ PO PACK
40.0000 meq | PACK | Freq: Once | ORAL | Status: AC
  Administered 2021-05-07: 40 meq via ORAL
  Filled 2021-05-07: qty 2

## 2021-05-07 NOTE — Progress Notes (Signed)
PROGRESS NOTE    Brandon Hooper  UJW:[id number] DOB: 1945-05-16 DOA: 05/06/2021 PCP: Kandyce Rud, MD    Brief Narrative:   Brandon Hooper is a 76 y.o. male with medical history significant of hypertension, hyperlipidemia, stroke, chronic venous insufficiency of legs, hiatal hernia, chronic back pain, dCHF, who presents with back pain and bilateral leg swelling. Patient is diagnosed with cellulitis, was started on vancomycin for Patient also had L1 compression fracture on CT scan.  IR has already scheduled outpatient kyphoplasty.  Assessment & Plan:   Principal Problem:   Closed compression fracture of body of L1 vertebra (HCC) Active Problems:   Essential hypertension   Stroke (HCC)   Hypokalemia   Sepsis (HCC)   HLD (hyperlipidemia)   Chronic diastolic CHF (congestive heart failure) (HCC)   Diarrhea   Bilateral lower leg cellulitis   Urinary retention  L1 lumbar compression fracture. Will start a TLSO, radiology has reached out to me today, the will order outpatient kyphoplasty.  Patient does not need procedure inside the hospital. Continue as needed pain medicine.  Sepsis secondary to cellulitis. Bilateral lower leg cellulitis. Patient currently on vancomycin, leg redness is already improving.  We will continue another day of IV antibiotics.  Will change to oral antibiotics tomorrow and discharge.  Chronic diastolic congestive heart failure. Stable, no volume overload.  Diarrhea. Pending C. difficile toxin, diarrhea seems to be better today.  Essential hypertension Continue home medicines.  Hypokalemia. Mild hyponatremia. Repleted, potassium 3.5 today, will give another 40 mEq of oral potassium.    DVT prophylaxis: Lovenox Code Status: full Family Communication:  Disposition Plan:    Status is: Inpatient  Remains inpatient appropriate because: Severity of disease, IV antibiotics.        I/O last 3 completed shifts: In: 200 [IV  Piggyback:200] Out: 2500 [Urine:2500] No intake/output data recorded.     Consultants:    Procedures: None  Antimicrobials: Vancomycin  Subjective: Patient still complaining some back pain, controlled with pain medicine.  Leg edema is getting better. No fever chills He feels weak, but not dizziness.  Objective: Vitals:   05/06/21 2000 05/07/21 0100 05/07/21 0449 05/07/21 0945  BP: (!) 144/57 119/65 133/64 121/60  Pulse: 76 76 82 78  Resp: 15 15 15 16   Temp: 97.8 F (36.6 C) (!) 97.5 F (36.4 C) 97.9 F (36.6 C) 98.1 F (36.7 C)  TempSrc: Oral Oral Oral   SpO2: 100% 100% 97% 98%  Weight:      Height:        Intake/Output Summary (Last 24 hours) at 05/07/2021 1101 Last data filed at 05/07/2021 0305 Gross per 24 hour  Intake 200 ml  Output 2500 ml  Net -2300 ml   Filed Weights   05/06/21 0723  Weight: 102.1 kg    Examination:  General exam: Appears calm and comfortable  Respiratory system: Clear to auscultation. Respiratory effort normal. Cardiovascular system: S1 & S2 heard, RRR. No JVD, murmurs, rubs, gallops or clicks. No pedal edema. Gastrointestinal system: Abdomen is nondistended, soft and nontender. No organomegaly or masses felt. Normal bowel sounds heard. Central nervous system: Alert and oriented. No focal neurological deficits. Extremities: Bilateral lower extremity chronic lymphedema, bilateral ankles slightly red, no tenderness. Skin: No rashes, lesions or ulcers Psychiatry: Judgement and insight appear normal. Mood & affect appropriate.     Data Reviewed: I have personally reviewed following labs and imaging studies  CBC: Recent Labs  Lab 05/06/21 0728 05/07/21 0520  WBC 12.4* 10.3  HGB 12.3*  11.6*  HCT 37.6* 34.7*  MCV 91.5 89.7  PLT 306 285   Basic Metabolic Panel: Recent Labs  Lab 05/06/21 0728 05/07/21 0520  NA 136 133*  K 3.3* 3.5  CL 101 104  CO2 23 22  GLUCOSE 95 97  BUN 14 14  CREATININE 0.81 0.53*  CALCIUM 8.6*  8.0*  MG 2.2  --    GFR: Estimated Creatinine Clearance: 101.7 mL/min (A) (by C-G formula based on SCr of 0.53 mg/dL (L)). Liver Function Tests: Recent Labs  Lab 05/06/21 0728  AST 68*  ALT 22  ALKPHOS 96  BILITOT 1.7*  PROT 7.6  ALBUMIN 3.3*   Recent Labs  Lab 05/06/21 0728  LIPASE 22   No results for input(s): AMMONIA in the last 168 hours. Coagulation Profile: Recent Labs  Lab 05/06/21 2046  INR 1.0   Cardiac Enzymes: No results for input(s): CKTOTAL, CKMB, CKMBINDEX, TROPONINI in the last 168 hours. BNP (last 3 results) No results for input(s): PROBNP in the last 8760 hours. HbA1C: No results for input(s): HGBA1C in the last 72 hours. CBG: No results for input(s): GLUCAP in the last 168 hours. Lipid Profile: No results for input(s): CHOL, HDL, LDLCALC, TRIG, CHOLHDL, LDLDIRECT in the last 72 hours. Thyroid Function Tests: No results for input(s): TSH, T4TOTAL, FREET4, T3FREE, THYROIDAB in the last 72 hours. Anemia Panel: No results for input(s): VITAMINB12, FOLATE, FERRITIN, TIBC, IRON, RETICCTPCT in the last 72 hours. Sepsis Labs: Recent Labs  Lab 05/06/21 1526 05/06/21 1712 05/06/21 2046  PROCALCITON <0.10  --   --   LATICACIDVEN  --  1.4 2.4*    Recent Results (from the past 240 hour(s))  Aerobic/Anaerobic Culture w Gram Stain (surgical/deep wound)     Status: None (Preliminary result)   Collection Time: 05/06/21  3:42 PM   Specimen: Leg  Result Value Ref Range Status   Specimen Description   Final    LEG LEFT Performed at [facility] [address], [place], [place] [address]    Special Requests   Final    NONE Performed at [facility], [address] [place], [place] [address]    Gram Stain   Final    NO SQUAMOUS EPITHELIAL CELLS SEEN RARE WBC SEEN NO ORGANISMS SEEN    Culture   Final    TOO YOUNG TO READ Performed at [facility], [address], [place], [place] [address]    Report Status PENDING   Incomplete  Resp Panel by RT-PCR (Flu A&B, Covid) Nasopharyngeal Swab     Status: None   Collection Time: 05/06/21  5:05 PM   Specimen: Nasopharyngeal Swab; Nasopharyngeal(NP) swabs in vial transport medium  Result Value Ref Range Status   SARS Coronavirus 2 by RT PCR NEGATIVE NEGATIVE Final    Comment: (NOTE) SARS-CoV-2 target nucleic acids are NOT DETECTED.  The SARS-CoV-2 RNA is generally detectable in upper respiratory specimens during the acute phase of infection. The lowest concentration of SARS-CoV-2 viral copies this assay can detect is 138 copies/mL. A negative result does not preclude SARS-Cov-2 infection and should not be used as the sole basis for treatment or other patient management decisions. A negative result may occur with  improper specimen collection/handling, submission of specimen other than nasopharyngeal swab, presence of viral mutation(s) within the areas targeted by this assay, and inadequate number of viral copies(<138 copies/mL). A negative result must be combined with clinical observations, patient history, and epidemiological information. The expected result is Negative.  Fact Sheet for Patients:  BloggerCourse.com  Fact Sheet for Healthcare Providers:  SeriousBroker.it  This test is no t yet approved or cleared by the [place] FDA and  has been authorized for detection and/or diagnosis of SARS-CoV-2 by FDA under an Emergency Use Authorization (EUA). This EUA will remain  in effect (meaning this test can be used) for the duration of the COVID-19 declaration under Section 564(b)(1) of the Act, 21 U.S.C.section 360bbb-3(b)(1), unless the authorization is terminated  or revoked sooner.       Influenza A by PCR NEGATIVE NEGATIVE Final   Influenza B by PCR NEGATIVE NEGATIVE Final    Comment: (NOTE) The Xpert Xpress SARS-CoV-2/FLU/RSV plus assay is intended as an aid in the diagnosis of influenza  from Nasopharyngeal swab specimens and should not be used as a sole basis for treatment. Nasal washings and aspirates are unacceptable for Xpert Xpress SARS-CoV-2/FLU/RSV testing.  Fact Sheet for Patients: BloggerCourse.com  Fact Sheet for Healthcare Providers: SeriousBroker.it  This test is not yet approved or cleared by the [place] FDA and has been authorized for detection and/or diagnosis of SARS-CoV-2 by FDA under an Emergency Use Authorization (EUA). This EUA will remain in effect (meaning this test can be used) for the duration of the COVID-19 declaration under Section 564(b)(1) of the Act, 21 U.S.C. section 360bbb-3(b)(1), unless the authorization is terminated or revoked.  Performed at [facility], [address] [place], [place] [address]   Culture, blood (x 2)     Status: None (Preliminary result)   Collection Time: 05/06/21  5:12 PM   Specimen: BLOOD  Result Value Ref Range Status   Specimen Description BLOOD RIGHT ANTECUBITAL  Final   Special Requests   Final    BOTTLES DRAWN AEROBIC AND ANAEROBIC Blood Culture results may not be optimal due to an excessive volume of blood received in culture bottles   Culture   Final    NO GROWTH < 12 HOURS Performed at [facility], [address] [place], [place] [address]    Report Status PENDING  Incomplete  Culture, blood (x 2)     Status: None (Preliminary result)   Collection Time: 05/06/21  8:46 PM   Specimen: BLOOD  Result Value Ref Range Status   Specimen Description BLOOD BLOOD LEFT WRIST  Final   Special Requests   Final    BOTTLES DRAWN AEROBIC AND ANAEROBIC Blood Culture adequate volume   Culture   Final    NO GROWTH < 12 HOURS Performed at [facility], [address] [place], [place] [address]    Report Status PENDING  Incomplete         Radiology Studies: DG Chest 2 View  Result Date: 05/06/2021 CLINICAL DATA:   Pt c/o weakness since last night. EMS reports bilateral edema and warmth in his legs. Pt is supposed to be taking amoxicillin but pt stopped taking them for diarrhea EXAM: CHEST - 2 VIEW COMPARISON:  02/12/2021 FINDINGS: Cardiac silhouette is normal in size. No mediastinal or hilar masses. No evidence of adenopathy. Mild, left greater than right, linear lung base opacities consistent with atelectasis. Remainder of the lungs is clear. No pleural effusion or pneumothorax. Skeletal structures are grossly intact. IMPRESSION: No active cardiopulmonary disease. Electronically Signed   By: Amie Portland M.D.   On: 05/06/2021 12:27   DG Thoracic Spine 2 View  Result Date: 05/06/2021 CLINICAL DATA:  Fall in late December.  Continued back pain. EXAM: THORACIC SPINE 2  VIEWS COMPARISON:  None. FINDINGS: No fracture or bone lesion.  Osseous structures are demineralized. No spondylolisthesis. Loss of disc height with endplate osteophytes throughout the thoracic spine. Soft tissues are unremarkable. IMPRESSION: 1. No fracture or acute finding.  No spondylolisthesis. 2. Disc degenerative changes. Electronically Signed   By: Amie Portlandavid  Ormond M.D.   On: 05/06/2021 15:14   DG Lumbar Spine Complete  Result Date: 05/06/2021 CLINICAL DATA:  Fall in late December.  Continued low back pain. EXAM: LUMBAR SPINE - COMPLETE 4+ VIEW COMPARISON:  02/12/2021. FINDINGS: Fracture of L1, with approximately 30% decrease in the anterior and central vertebral body height, posterior vertebral body height maintained. This is new compared to the prior radiographs. No other fractures.  No spondylolisthesis. Mild loss of disc height at L4-L5. Remaining lumbar discs are well preserved in height. Small endplate osteophytes noted from L2 through L5. IMPRESSION: 1. Mild to moderate compression fracture of L1, which is new since the prior radiographs, and consistent with a fracture from the fall in late December 2022. 2. No other fractures and no other  change. Electronically Signed   By: Amie Portlandavid  Ormond M.D.   On: 05/06/2021 15:17   DG Sacrum/Coccyx  Result Date: 05/06/2021 CLINICAL DATA:  Fall in late December 2022. Continued low back pain. EXAM: SACRUM AND COCCYX - 2+ VIEW COMPARISON:  None. FINDINGS: No fracture or bone lesion. SI joints normally spaced and aligned. Soft tissues are unremarkable. IMPRESSION: No fracture. Electronically Signed   By: Amie Portlandavid  Ormond M.D.   On: 05/06/2021 15:18   CT Lumbar Spine Wo Contrast  Result Date: 05/06/2021 CLINICAL DATA:  Compression fracture, back pain EXAM: CT LUMBAR SPINE WITHOUT CONTRAST TECHNIQUE: Multidetector CT imaging of the lumbar spine was performed without intravenous contrast administration. Multiplanar CT image reconstructions were also generated. RADIATION DOSE REDUCTION: This exam was performed according to the departmental dose-optimization program which includes automated exposure control, adjustment of the mA and/or kV according to patient size and/or use of iterative reconstruction technique. COMPARISON:  Lumbar spine radiographs 02/12/2021, 05/06/2021 FINDINGS: Segmentation: Standard; the lowest formed disc space is designated L5-S1 Alignment: Normal. Vertebrae: There is a transverse fracture through the superior aspect of the L1 vertebral body with involvement of the anterior and superior endplates. There is no extension to the posterior elements. There is mild bony retropulsion and mild loss of vertebral body height. These findings are new since 02/12/2021. The other vertebral body heights are preserved. There is no suspicious osseous abnormality. Paraspinal and other soft tissues: The paraspinal soft tissues are unremarkable. There is calcified atherosclerotic plaque in the infrarenal abdominal aorta. There is a moderate stool burden in the rectum. Disc levels: The disc heights are overall preserved. There is mild degenerative endplate change in the lumbar spine. There is mild multilevel facet  arthropathy, most advanced at L5-S1. There is mild spinal canal stenosis at T12-L1 due to the bony retropulsion. Otherwise, there is no significant spinal canal or neural foraminal stenosis. IMPRESSION: 1. Acute appearing fracture of the L1 vertebral body involving the anterior and superior endplates with mild bony retropulsion and mild loss of vertebral body height. The retropulsion results in mild spinal canal stenosis. There is no evidence of extension into the posterior elements. 2. Moderate stool burden in the rectum. 3.  Aortic Atherosclerosis (ICD10-I70.0). Electronically Signed   By: Lesia HausenPeter  Noone M.D.   On: 05/06/2021 16:06   US Venous Img Lower Bilateral (DVT)  Result Date: 05/07/2021 CLINICAL DATA:  Bilateral lower extremity cellulitis EXAM: BILATERAL  LOWER EXTREMITY VENOUS DOPPLER ULTRASOUND TECHNIQUE: Gray-scale sonography with graded compression, as well as color Doppler and duplex ultrasound were performed to evaluate the lower extremity deep venous systems from the level of the common femoral vein and including the common femoral, femoral, profunda femoral, popliteal and calf veins including the posterior tibial, peroneal and gastrocnemius veins when visible. The superficial great saphenous vein was also interrogated. Spectral Doppler was utilized to evaluate flow at rest and with distal augmentation maneuvers in the common femoral, femoral and popliteal veins. COMPARISON:  CT L-spine, 05/06/2021. LEFT lower extremity venous duplex report, 10/08/2011 FINDINGS: Suboptimal evaluation, with poor acoustic penetration secondary to patient habitus. RIGHT LOWER EXTREMITY VENOUS Normal compressibility of the RIGHT common femoral, superficial femoral, and popliteal veins, as well as the visualized calf veins. Visualized portions of profunda femoral vein and great saphenous vein unremarkable. No filling defects to suggest DVT on grayscale or color Doppler imaging. Doppler waveforms show normal direction of  venous flow, normal respiratory plasticity and response to augmentation. OTHER No evidence of superficial thrombophlebitis or abnormal fluid collection. Limitations: none LEFT LOWER EXTREMITY VENOUS Normal compressibility of the LEFT common femoral, superficial femoral, and popliteal veins, as well as the visualized calf veins. Visualized portions of profunda femoral vein and great saphenous vein unremarkable. No filling defects to suggest DVT on grayscale or color Doppler imaging. Doppler waveforms show normal direction of venous flow, normal respiratory plasticity and response to augmentation. OTHER No evidence of superficial thrombophlebitis or abnormal fluid collection. Limitations: none IMPRESSION: Suboptimal evaluation, within these constraints; No evidence of femoropopliteal DVT within either lower extremity. Roanna BanningJon Mugweru, MD [facility] Electronically Signed   By: Roanna BanningJon  Mugweru M.D.   On: 05/07/2021 07:57        Scheduled Meds:  amLODipine  5 mg Oral Daily   aspirin  81 mg Oral Daily   atorvastatin  40 mg Oral Daily   heparin  5,000 Units Subcutaneous Q8H   lidocaine  1 patch Transdermal Q24H   potassium chloride  40 mEq Oral Once   Continuous Infusions:  vancomycin 1,500 mg (05/07/21 1014)     LOS: 1 day    Time spent: 28 minutes    Marrion Coyekui Xaidyn Kepner, MD [facility]   To contact the attending provider between 7A-7P or the covering provider during after hours 7P-7A, please log into the web site [email or web address] and access using universal [place] password for that web site. If you do not have the password, please call the hospital operator.  05/07/2021, 11:01 AM

## 2021-05-07 NOTE — Evaluation (Signed)
Occupational Therapy Evaluation Patient Details Name: Brandon Hooper MRN: [id number] DOB: May 11, 1945 Today's Date: 05/07/2021   History of Present Illness 76 y.o. male with medical history significant of hypertension, hyperlipidemia, stroke (Nov 2022), chronic venous insufficiency of legs, hiatal hernia, chronic back pain, dCHF, who presents with back pain and bilateral leg swelling.  Patient was found to have sepsis secondary to bilateral lower leg cellulitis. Patient also found to have L1 compression fracture on CT scan (IR scheduled outpatient kyphoplasty; pt to wear back brace for comfort when ambulating)   Clinical Impression   Pt seen for OT evaluation this date. Since stroke (Nov 2022), pt has been living with wife in an independent living facility, receiving MIN physical assist from wife when walking with quad cane, and receiving physical assist from wife for LB ADLs. Pt endorsed 2 falls within the past 6 months. Pt currently presents with back pain, decreased strength, decreased balance, and decreased activity tolerance. Due to these functional impairments, pt requires MIN A for bed mobility, MAX A for donning TLSO while seated EOB, and MAX A for donning socks while seated EOB. Pt educated in functional application of back precautions for pain management (e.g., log roll technique, AE/DME for bathing/dressing/toileting, and home/routines modifications); handout provided. OOB mobility portion of session coordinated with PT to maximize safety with functional mobility (see PT note). This date, pt endorsed requiring more physical assistance for functional mobility compared to baseline and pt verbalized wanting "at least one more day" to practice education discussed this session. Pt would benefit from additional skilled OT services to maximize return to PLOF and minimize risk of future falls, injury, caregiver burden, and readmission. Upon discharge, recommend SNF.   Recommendations for follow up  therapy are one component of a multi-disciplinary discharge planning process, led by the attending physician.  Recommendations may be updated based on patient status, additional functional criteria and insurance authorization.   Follow Up Recommendations  Skilled nursing-short term rehab (<3 hours/day)    Assistance Recommended at Discharge Frequent or constant Supervision/Assistance  Patient can return home with the following Two people to help with walking and/or transfers;A lot of help with bathing/dressing/bathroom    Functional Status Assessment  Patient has had a recent decline in their functional status and demonstrates the ability to make significant improvements in function in a reasonable and predictable amount of time.  Equipment Recommendations  Other (comment) (defer to next venue of care)       Precautions / Restrictions Precautions Precautions: Fall Required Braces or Orthoses: Spinal Brace Spinal Brace: Thoracolumbosacral orthotic;Lumbar corset;Applied in sitting position (Pt provided with TLSO, but unable to breathe with cervical collar. MD okay'd pt to wear lumbar corset instead) Restrictions Weight Bearing Restrictions: No Other Position/Activity Restrictions: To wear back brace for comfort when walking      Mobility Bed Mobility Overal bed mobility: Needs Assistance Bed Mobility: Supine to Sit     Supine to sit: Min assist     General bed mobility comments: MIN A for bring trunk upright during log roll    Transfers Overall transfer level: Needs assistance Equipment used: 2 person hand held assist Transfers: Sit to/from Stand Sit to Stand: Mod assist, +2 physical assistance                  Balance Overall balance assessment: Needs assistance Sitting-balance support: Single extremity supported, Feet supported Sitting balance-Leahy Scale: Fair Sitting balance - Comments: Requires SUPERVISION for sitting EOB during seated UB dressing   Standing  balance support: Bilateral upper extremity supported, During functional activity Standing balance-Leahy Scale: Poor Standing balance comment: Requires MOD Ax2 for taking lateral steps                           ADL either performed or assessed with clinical judgement   ADL Overall ADL's : Needs assistance/impaired                 Upper Body Dressing : Maximal assistance;Sitting Upper Body Dressing Details (indicate cue type and reason): to don TLSO while sitting EOB Lower Body Dressing: Maximal assistance;Sitting/lateral leans Lower Body Dressing Details (indicate cue type and reason): to don socks while sitting EOB                     Vision Patient Visual Report: No change from baseline              Pertinent Vitals/Pain Pain Assessment Pain Assessment: 0-10 Pain Score: 6  Pain Location: lower back Pain Descriptors / Indicators: Aching Pain Intervention(s): Limited activity within patient's tolerance, Monitored during session        Extremity/Trunk Assessment Upper Extremity Assessment Upper Extremity Assessment: Generalized weakness   Lower Extremity Assessment Lower Extremity Assessment: Generalized weakness   Cervical / Trunk Assessment Cervical / Trunk Assessment: Kyphotic   Communication Communication Communication: No difficulties   Cognition Arousal/Alertness: Awake/alert Behavior During Therapy: WFL for tasks assessed/performed Overall Cognitive Status: Within Functional Limits for tasks assessed                                 General Comments: Pt A&Ox4. Pleasant and agreeable throughout                Home Living Family/patient expects to be discharged to:: Private residence Living Arrangements: Spouse/significant other Available Help at Discharge: Family;Available PRN/intermittently Type of Home: Independent living facility Home Access: Level entry     Home Layout: One level     Bathroom Shower/Tub:  Walk-in shower;Sponge bathes at baseline   Bathroom Toilet: Handicapped height     Home Equipment: Shower seat - built in;Grab bars - tub/shower;Cane - single point          Prior Functioning/Environment Prior Level of Function : Needs assist             Mobility Comments: Since stroke (Nov 2022), pt was using quad cane for functional mobility, receiving MIN A for steadying from wife. Pt endorses 2 falls within the past 6 months ADLs Comments: Since stroke (Nov 2022), pt was receiving assistance from wife for LB ADLs. Pt spongebathes at baseline        OT Problem List: Decreased strength;Decreased activity tolerance;Impaired balance (sitting and/or standing);Decreased knowledge of precautions;Decreased knowledge of use of DME or AE;Pain      OT Treatment/Interventions: Self-care/ADL training;Therapeutic exercise;DME and/or AE instruction;Therapeutic activities;Patient/family education;Balance training    OT Goals(Current goals can be found in the care plan section) Acute Rehab OT Goals Patient Stated Goal: to go home after getting more practice putting on back brace OT Goal Formulation: With patient Time For Goal Achievement: 05/21/21 Potential to Achieve Goals: Good ADL Goals Pt Will Perform Grooming: with min assist;standing Pt Will Perform Upper Body Dressing: with min assist;sitting (to don back brace) Pt Will Transfer to Toilet: with min assist;stand pivot transfer  OT Frequency: Min 2X/week  AM-PAC OT "6 Clicks" Daily Activity     Outcome Measure Help from another person eating meals?: None Help from another person taking care of personal grooming?: A Little Help from another person toileting, which includes using toliet, bedpan, or urinal?: A Lot Help from another person bathing (including washing, rinsing, drying)?: A Lot Help from another person to put on and taking off regular upper body clothing?: A Lot Help from another person to put on and taking off  regular lower body clothing?: A Lot 6 Click Score: 15   End of Session Equipment Utilized During Treatment: Back brace Nurse Communication: Mobility status  Activity Tolerance: Patient tolerated treatment well Patient left: in bed;Other (comment) (with PT)  OT Visit Diagnosis: Unsteadiness on feet (R26.81);Muscle weakness (generalized) (M62.81);Pain Pain - part of body:  (back)                Time: 1130-1158 OT Time Calculation (min): 28 min Charges:  OT General Charges $OT Visit: 1 Visit OT Evaluation $OT Eval Moderate Complexity: 1 Mod OT Treatments $Self Care/Home Management : 8-22 mins  Matthew FolksLucy D Adie Vilar, OTR/L [facility] [phone]

## 2021-05-07 NOTE — Progress Notes (Signed)
Wound care completed to BLE per order.

## 2021-05-07 NOTE — Evaluation (Signed)
Physical Therapy Evaluation Patient Details Name: Brandon Hooper MRN: [id number] DOB: October 03, 1945 Today's Date: 05/07/2021  History of Present Illness  76 y.o. male with medical history significant of hypertension, hyperlipidemia, stroke (Nov 2022), chronic venous insufficiency of legs, hiatal hernia, chronic back pain, dCHF, who presents with back pain and bilateral leg swelling.  Patient was found to have sepsis secondary to bilateral lower leg cellulitis. Patient also found to have L1 compression fracture on CT scan (IR scheduled outpatient kyphoplasty; pt to wear back brace for comfort when ambulating)   Clinical Impression  Pt seated EOB upon arrival to room with OT assisting.  Pt able to perform seated exercises with therapist and then progressed toward transferring to the chair and back to bed.  Pt did not sit in chair due to X-Ray needing him in the bed.  Pt with good transfer skills, however requires verbal cuing and elevated hospital bed to perform.  Pt needing 2 person HHA for transfers/ambulation within the room.  Pt then requiring modA for navigating LE's in the bed while performing log roll technique to get back in bed.  Pt left with all needs met and call bell within place.  Pt will benefit from skilled PT intervention to increase independence and safety with basic mobility in preparation for discharge to the venue listed below.  ,.       Recommendations for follow up therapy are one component of a multi-disciplinary discharge planning process, led by the attending physician.  Recommendations may be updated based on patient status, additional functional criteria and insurance authorization.  Follow Up Recommendations Skilled nursing-short term rehab (<3 hours/day)    Assistance Recommended at Discharge Frequent or constant Supervision/Assistance  Patient can return home with the following  Two people to help with walking and/or transfers;A lot of help with  bathing/dressing/bathroom;Assistance with cooking/housework;Help with stairs or ramp for entrance    Equipment Recommendations Rolling walker (2 wheels)  Recommendations for Other Services       Functional Status Assessment Patient has had a recent decline in their functional status and demonstrates the ability to make significant improvements in function in a reasonable and predictable amount of time.     Precautions / Restrictions Precautions Precautions: Fall Required Braces or Orthoses: Spinal Brace Spinal Brace: Thoracolumbosacral orthotic;Lumbar corset;Applied in sitting position (Pt provided with TLSO, but unable to breathe with cervical collar. MD okay'd pt to wear lumbar corset instead) Restrictions Weight Bearing Restrictions: No Other Position/Activity Restrictions: To wear back brace for comfort when walking      Mobility  Bed Mobility Overal bed mobility: Needs Assistance Bed Mobility: Sit to Supine       Sit to supine: Mod assist   General bed mobility comments: modA to bring LE's upto bed during transfer.    Transfers Overall transfer level: Needs assistance Equipment used: 2 person hand held assist Transfers: Sit to/from Stand Sit to Stand: Mod assist, +2 physical assistance                Ambulation/Gait Ambulation/Gait assistance: Mod assist, +2 physical assistance Gait Distance (Feet): 4 Feet Assistive device: 2 person hand held assist Gait Pattern/deviations: Step-to pattern, Decreased step length - right, Decreased step length - left       General Gait Details: Pt takes small steady steps when transferring to the chair and back to the bed.  Stairs            Wheelchair Mobility    Modified Rankin (Stroke Patients Only)  Balance                                             Pertinent Vitals/Pain Pain Assessment Pain Assessment: 0-10 Pain Score: 6  Pain Location: lower back Pain Descriptors /  Indicators: Aching Pain Intervention(s): Limited activity within patient's tolerance, Monitored during session    Home Living Family/patient expects to be discharged to:: Private residence Living Arrangements: Spouse/significant other Available Help at Discharge: Family;Available PRN/intermittently Type of Home: Independent living facility Home Access: Level entry       Home Layout: One level Home Equipment: Shower seat - built in;Grab bars - tub/shower;Kasandra Knudsen - single point      Prior Function Prior Level of Function : Needs assist             Mobility Comments: Since stroke (Nov 2022), pt was using quad cane for functional mobility, receiving MIN A for steadying from wife. Pt endorses 2 falls within the past 6 months ADLs Comments: Since stroke (Nov 2022), pt was receiving assistance from wife for LB ADLs. Pt spongebathes at baseline     Hand Dominance        Extremity/Trunk Assessment   Upper Extremity Assessment Upper Extremity Assessment: Generalized weakness    Lower Extremity Assessment Lower Extremity Assessment: Generalized weakness    Cervical / Trunk Assessment Cervical / Trunk Assessment: Kyphotic  Communication   Communication: No difficulties  Cognition Arousal/Alertness: Awake/alert Behavior During Therapy: WFL for tasks assessed/performed Overall Cognitive Status: Within Functional Limits for tasks assessed                                 General Comments: Pt A&Ox4. Pleasant and agreeable throughout        General Comments      Exercises Total Joint Exercises Ankle Circles/Pumps: AROM, Strengthening, Both, 10 reps, Seated Gluteal Sets: AROM, Strengthening, Both, 10 reps, Seated Heel Slides: AROM, Strengthening, Both, 10 reps, Seated Long Arc Quad: AROM, Strengthening, Both, 10 reps, Seated Marching in Standing: AROM, Strengthening, Both, 10 reps, Seated Other Exercises Other Exercises: Pt educated on role of PT and services  provided during hospital stay.  Pt also educated on the importance of mobility in order to prevent muscle atrophy.   Assessment/Plan    PT Assessment Patient needs continued PT services  PT Problem List Decreased strength;Decreased activity tolerance;Decreased balance;Decreased mobility;Decreased knowledge of use of DME;Decreased safety awareness       PT Treatment Interventions DME instruction;Gait training;Functional mobility training;Therapeutic activities;Therapeutic exercise;Balance training;Neuromuscular re-education    PT Goals (Current goals can be found in the Care Plan section)  Acute Rehab PT Goals Patient Stated Goal: to be more mobile. PT Goal Formulation: With patient Time For Goal Achievement: 05/21/21 Potential to Achieve Goals: Fair    Frequency Min 2X/week     Co-evaluation               AM-PAC PT "6 Clicks" Mobility  Outcome Measure Help needed turning from your back to your side while in a flat bed without using bedrails?: A Little Help needed moving from lying on your back to sitting on the side of a flat bed without using bedrails?: A Little Help needed moving to and from a bed to a chair (including a wheelchair)?: A Lot Help needed standing up from a  chair using your arms (e.g., wheelchair or bedside chair)?: A Lot Help needed to walk in hospital room?: A Lot Help needed climbing 3-5 steps with a railing? : Total 6 Click Score: 13    End of Session Equipment Utilized During Treatment: Back brace Activity Tolerance: Patient tolerated treatment well Patient left: in bed;with call bell/phone within reach;with bed alarm set Nurse Communication: Mobility status PT Visit Diagnosis: Unsteadiness on feet (R26.81);Other abnormalities of gait and mobility (R26.89);Muscle weakness (generalized) (M62.81);Difficulty in walking, not elsewhere classified (R26.2)    Time: 8938-1017 PT Time Calculation (min) (ACUTE ONLY): 20 min   Charges:   PT  Evaluation $PT Eval Moderate Complexity: 1 Mod PT Treatments $Therapeutic Exercise: 8-22 mins        Gwenlyn Saran, PT, DPT 05/07/21, 2:33 PM   Christie Nottingham 05/07/2021, 2:30 PM

## 2021-05-07 NOTE — Consult Note (Signed)
WOC Nurse Consult Note: Patient receiving care in [facility]. Consult completed remotely. Reason for Consult: BLE ulcers Wound type: BLE with changes consistent with venous insufficiency. See photos. Pressure Injury POA: Yes/No/NA Measurement: Wound bed: ruddy red Drainage (amount, consistency, odor) To be provided by the bedside RN in the flowsheet section  Periwound: hemosiderin staining, dry/flaking skin, edema, erythema Dressing procedure/placement/frequency: Wash legs with soap and water. Pat dry. Apply Sween Moisturizing Ointment. Place Xeroform gauzes over and open wounds, cover with ABD pads. Beginning behind the toes and going to just below the knees, spiral wrap kerlix, then 4 inch ace wrap. Perform daily and prn soilage.   Monitor the wound area(s) for worsening of condition such as: Signs/symptoms of infection,  Increase in size,  Development of or worsening of odor, Development of pain, or increased pain at the affected locations.  Notify the medical team if any of these develop.  Thank you for the consult. WOC nurse will not follow at this time.  Please re-consult the WOC team if needed.  Helmut Muster, RN, MSN, CWOCN, CNS-BC, pager [phone]

## 2021-05-07 NOTE — Consult Note (Signed)
Neurosurgery-New Consultation Evaluation 05/07/2021 Brandon Hooper [id number]  Identifying Statement: Brandon Hooper is a 76 y.o. male from [place] [id number] with L1 superior endplate compression fracture  Physician Requesting Consultation: Dr. Roosevelt Locks   History of Present Illness: The patient is a 76 year old male with past medical history significant significant for hypertension hyperlipidemia stroke in November 2022 chronic venous insufficiency of the legs and CHF who had presented to the hospital with low back pain as well as bilateral lower extremity swelling and question of lower extremity cellulitis in the setting of venous insufficiency.  The patient had been started on antibiotics for this.  CT scan imaging that was performed been performed on arrival and demonstrated a superior endplate fracture of the L1 vertebrae with minimal retropulsion of bone fragments open spinal canal and neurologically intact on presentation.  Neurosurgery was consulted for further evaluation.  The patient is already been arranged for outpatient follow-up for kyphoplasty of this with the IR service.  The patient notes that the low back pain began in November 22 when he suffered a stroke during which time he had lost strength in his legs acutely and had fallen.  He had also suffered an additional fall approximately a week ago.  He denies any bowel or bladder incontinence.  He denies any radicular pain or numbness or loss of sensation in his lower extremities.   while in Past Medical History:  Past Medical History:  Diagnosis Date   Arthritis    Bone loss    Hypertension     Social History: Social History   Socioeconomic History   Marital status: Married    Spouse name: Not on file   Number of children: Not on file   Years of education: Not on file   Highest education level: Not on file  Occupational History   Not on file  Tobacco Use   Smoking status: Never   Smokeless tobacco: Never   Substance and Sexual Activity   Alcohol use: Not Currently   Drug use: Not on file   Sexual activity: Not on file  Other Topics Concern   Not on file  Social History Narrative   Not on file   Social Determinants of Health   Financial Resource Strain: Not on file  Food Insecurity: Not on file  Transportation Needs: Not on file  Physical Activity: Not on file  Stress: Not on file  Social Connections: Not on file  Intimate Partner Violence: Not on file   Living arrangements (living alone, with partner): Lives in an apartment  Family History: Family History  Problem Relation Age of Onset   Diabetes Father     Review of Systems:  Review of Systems - General ROS: Negative Psychological ROS: Negative Ophthalmic ROS: Negative ENT ROS: Negative Hematological and Lymphatic ROS: Negative  Endocrine ROS: Negative Respiratory ROS: Negative Cardiovascular ROS: Negative Gastrointestinal ROS: Negative Genito-Urinary ROS: Negative Musculoskeletal ROS: Negative Neurological ROS: Patient notes that he has low back pain ongoing with some difficulty ambulation as a result of it. Dermatological ROS: Negative  Physical Exam: BP 121/60 (BP Location: Left Arm)    Pulse 78    Temp 98.1 F (36.7 C)    Resp 16    Ht 6' (1.829 m)    Wt 102.1 kg    SpO2 98%    BMI 30.52 kg/m  Body mass index is 30.52 kg/m. Body surface area is 2.28 meters squared.   Neurologic exam:  Mental status: alertness: Somewhat slow to activate but overall  interactive appropriately, orientation: person, place, time, affect: normal Speech: fluent and clear Cranial nerves:  II: Visual fields are full by confrontation, no ptosis III/IV/VI: extra-ocular motions intact bilaterally V/VII:no evidence of facial droop or weakness and facial sensation intact VIII: hearing normal XI: trapezius strength symmetric,  sternocleidomastoid strength symmetric XII: tongue strength symmetric  Motor:strength symmetric 5/5, normal  muscle mass and tone in all extremities and no pronator drift Sensory: intact to light touch in all extremities Reflexes: 2+ and symmetric bilaterally for arms and legs Coordination: intact finger to nose Gait: Did not assess  Laboratory: Results for orders placed or performed during the hospital encounter of 05/06/21  Aerobic/Anaerobic Culture w Gram Stain (surgical/deep wound)   Specimen: Leg  Result Value Ref Range   Specimen Description      LEG LEFT Performed at [facility], [address], [place], [place] [address]    Special Requests      NONE Performed at [facility], [address] [place], [place] [address]    Gram Stain      NO SQUAMOUS EPITHELIAL CELLS SEEN RARE WBC SEEN NO ORGANISMS SEEN    Culture      TOO YOUNG TO READ Performed at [facility], [place] [address] [place], [place] [address]    Report Status PENDING   Resp Panel by RT-PCR (Flu A&B, Covid) Nasopharyngeal Swab   Specimen: Nasopharyngeal Swab; Nasopharyngeal(NP) swabs in vial transport medium  Result Value Ref Range   SARS Coronavirus 2 by RT PCR NEGATIVE NEGATIVE   Influenza A by PCR NEGATIVE NEGATIVE   Influenza B by PCR NEGATIVE NEGATIVE  Culture, blood (x 2)   Specimen: BLOOD  Result Value Ref Range   Specimen Description BLOOD RIGHT ANTECUBITAL    Special Requests      BOTTLES DRAWN AEROBIC AND ANAEROBIC Blood Culture results may not be optimal due to an excessive volume of blood received in culture bottles   Culture      NO GROWTH < 12 HOURS Performed at [facility], [address] [place], [place] [address]    Report Status PENDING   Culture, blood (x 2)   Specimen: BLOOD  Result Value Ref Range   Specimen Description BLOOD BLOOD LEFT WRIST    Special Requests      BOTTLES DRAWN AEROBIC AND ANAEROBIC Blood Culture adequate volume   Culture      NO GROWTH < 12 HOURS Performed at [facility], [place], [place]  [address]    Report Status PENDING   Basic metabolic panel  Result Value Ref Range   Sodium 136 135 - 145 mmol/L   Potassium 3.3 (L) 3.5 - 5.1 mmol/L   Chloride 101 98 - 111 mmol/L   CO2 23 22 - 32 mmol/L   Glucose, Bld 95 70 - 99 mg/dL   BUN 14 8 - 23 mg/dL   Creatinine, Ser 0.81 0.61 - 1.24 mg/dL   Calcium 8.6 (L) 8.9 - 10.3 mg/dL   GFR, Estimated >60 >60 mL/min   Anion gap 12 5 - 15  CBC  Result Value Ref Range   WBC 12.4 (H) 4.0 - 10.5 K/uL   RBC 4.11 (L) 4.22 - 5.81 MIL/uL   Hemoglobin 12.3 (L) 13.0 - 17.0 g/dL   HCT 37.6 (L) 39.0 - 52.0 %   MCV 91.5 80.0 - 100.0 fL   MCH 29.9 26.0 - 34.0 pg   MCHC 32.7 30.0 - 36.0 g/dL  RDW 14.5 11.5 - 15.5 %   Platelets 306 150 - 400 K/uL   nRBC 0.0 0.0 - 0.2 %  Urinalysis, Routine w reflex microscopic  Result Value Ref Range   Color, Urine YELLOW YELLOW   APPearance CLEAR CLEAR   Specific Gravity, Urine 1.020 1.005 - 1.030   pH 5.5 5.0 - 8.0   Glucose, UA NEGATIVE NEGATIVE mg/dL   Hgb urine dipstick NEGATIVE NEGATIVE   Bilirubin Urine SMALL (A) NEGATIVE   Ketones, ur 15 (A) NEGATIVE mg/dL   Protein, ur 100 (A) NEGATIVE mg/dL   Nitrite NEGATIVE NEGATIVE   Leukocytes,Ua NEGATIVE NEGATIVE   RBC / HPF 0-5 0 - 5 RBC/hpf   WBC, UA 0-5 0 - 5 WBC/hpf   Bacteria, UA NONE SEEN NONE SEEN   Squamous Epithelial / LPF NONE SEEN 0 - 5   Mucus PRESENT   Hepatic function panel  Result Value Ref Range   Total Protein 7.6 6.5 - 8.1 g/dL   Albumin 3.3 (L) 3.5 - 5.0 g/dL   AST 68 (H) 15 - 41 U/L   ALT 22 0 - 44 U/L   Alkaline Phosphatase 96 38 - 126 U/L   Total Bilirubin 1.7 (H) 0.3 - 1.2 mg/dL   Bilirubin, Direct 0.4 (H) 0.0 - 0.2 mg/dL   Indirect Bilirubin 1.3 (H) 0.3 - 0.9 mg/dL  Lipase, blood  Result Value Ref Range   Lipase 22 11 - 51 U/L  Magnesium  Result Value Ref Range   Magnesium 2.2 1.7 - 2.4 mg/dL  Procalcitonin - Baseline  Result Value Ref Range   Procalcitonin <0.10 ng/mL  Brain natriuretic peptide  Result Value Ref  Range   B Natriuretic Peptide 53.6 0.0 - 100.0 pg/mL  C-reactive protein  Result Value Ref Range   CRP 7.6 (H) <1.0 mg/dL  Sedimentation rate  Result Value Ref Range   Sed Rate 46 (H) 0 - 16 mm/hr  Lactic acid, plasma  Result Value Ref Range   Lactic Acid, Venous 1.4 0.5 - 1.9 mmol/L  Lactic acid, plasma  Result Value Ref Range   Lactic Acid, Venous 2.4 (HH) 0.5 - 1.9 mmol/L  Protime-INR  Result Value Ref Range   Prothrombin Time 13.2 11.4 - 15.2 seconds   INR 1.0 0.8 - 1.2  APTT  Result Value Ref Range   aPTT 23 (L) 24 - 36 seconds  Basic metabolic panel  Result Value Ref Range   Sodium 133 (L) 135 - 145 mmol/L   Potassium 3.5 3.5 - 5.1 mmol/L   Chloride 104 98 - 111 mmol/L   CO2 22 22 - 32 mmol/L   Glucose, Bld 97 70 - 99 mg/dL   BUN 14 8 - 23 mg/dL   Creatinine, Ser 0.53 (L) 0.61 - 1.24 mg/dL   Calcium 8.0 (L) 8.9 - 10.3 mg/dL   GFR, Estimated >60 >60 mL/min   Anion gap 7 5 - 15  CBC  Result Value Ref Range   WBC 10.3 4.0 - 10.5 K/uL   RBC 3.87 (L) 4.22 - 5.81 MIL/uL   Hemoglobin 11.6 (L) 13.0 - 17.0 g/dL   HCT 34.7 (L) 39.0 - 52.0 %   MCV 89.7 80.0 - 100.0 fL   MCH 30.0 26.0 - 34.0 pg   MCHC 33.4 30.0 - 36.0 g/dL   RDW 14.2 11.5 - 15.5 %   Platelets 285 150 - 400 K/uL   nRBC 0.0 0.0 - 0.2 %  Troponin I (High Sensitivity)  Result Value  Ref Range   Troponin I (High Sensitivity) 22 (H) <18 ng/L  Troponin I (High Sensitivity)  Result Value Ref Range   Troponin I (High Sensitivity) 14 <18 ng/L   I personally reviewed labs  Imaging:  CT scan imaging viewed of the lumbar spine from 05/06/2021  \IMPRESSION: 1. Acute appearing fracture of the L1 vertebral body involving the anterior and superior endplates with mild bony retropulsion and mild loss of vertebral body height. The retropulsion results in mild spinal canal stenosis. There is no evidence of extension into the posterior elements. 2. Moderate stool burden in the rectum. 3.  Aortic Atherosclerosis  (ICD10-I70.0).      I personally reviewed radiology studies to include:   Impression/Plan:  Overall the patient is a 76 year old male who presented with low back pain exacerbation several months in duration initiating around the time of his stroke in November 2022 as well as bilateral lower extremity edema and question of cellulitis found on CT scan imaging of the lumbar spine to have a superior endplate compression fracture approximately 25% loss of height with minimal retropulsion of bone fragments of the bony canal without any significant spinal canal compression and otherwise intact normal alignment.   The patient is already been provided at bedside an LSO brace for comfort as needed. The patient is already been arranged for outpatient follow-up for evaluation of kyphoplasty by the IR service.  And has ongoing pain control which is well controlled at present with the hospitalist service.  Overdose appears to be a stable fracture though ongoing healing is needed in the area. 1.  Diagnosis L1 superior endplate compression fracture  2.  Plan LSO brace as needed comfort Mobilize as tolerated Outpatient neurosurgery clinic follow-up will be arranged for ongoing monitoring of this area and to assess for ongoing healing. Pain control as you are doing PT and OT evaluations as you are doing  Please let us know should any new or concerning symptoms arise.  Please call with questions.  Thank you for this interesting consult  Luciano Cutter. Johnney Killian, M.D. Neurosurgery

## 2021-05-08 LAB — BASIC METABOLIC PANEL
Anion gap: 7 (ref 5–15)
BUN: 8 mg/dL (ref 8–23)
CO2: 26 mmol/L (ref 22–32)
Calcium: 8.3 mg/dL — ABNORMAL LOW (ref 8.9–10.3)
Chloride: 104 mmol/L (ref 98–111)
Creatinine, Ser: 0.64 mg/dL (ref 0.61–1.24)
GFR, Estimated: >60 mL/min (ref >60)
Glucose, Bld: 81 mg/dL (ref 70–99)
Potassium: 3.9 mmol/L (ref 3.5–5.1)
Sodium: 137 mmol/L (ref 135–145)

## 2021-05-08 LAB — C DIFFICILE QUICK SCREEN W PCR REFLEX
C Diff antigen: NEGATIVE
C Diff interpretation: NOT DETECTED
C Diff toxin: NEGATIVE

## 2021-05-08 NOTE — TOC Progression Note (Addendum)
Transition of Care [facility]) - Progression Note    Patient Details  Name: Brandon Hooper MRN: [id number] Date of Birth: 1945/06/17  Transition of Care [facility]) CM/SW Contact  Marlowe Sax, RN Phone Number: 05/08/2021, 2:49 PM  Clinical Narrative:   The patient has been in [facility] before We reviewed the bed offers They would like to accept the bed offer from [facility],  I notified Tammy and started Ins approval  Ins approved to go to [facility] tomorrow [phone]       Expected Discharge Plan and Services                                                 Social Determinants of Health (SDOH) Interventions    Readmission Risk Interventions No flowsheet data found.

## 2021-05-08 NOTE — NC FL2 (Signed)
[place] MEDICAID FL2 LEVEL OF CARE SCREENING TOOL     IDENTIFICATION  Patient Name: Brandon Hooper Birthdate: 1945/05/17 Sex: male Admission Date (Current Location): 05/06/2021  [facility] and [place] Number:  Chiropodist and Address:  [facility], [address] [place], [place] [address]      Provider Number: [id number]  Attending Physician Name and Address:  Marrion Coy, MD  Relative Name and Phone Number:  Elease Hashimoto spouse [phone]    Current Level of Care: Hospital Recommended Level of Care: Skilled Nursing Facility Prior Approval Number:    Date Approved/Denied:   PASRR Number: [id number]  Discharge Plan: SNF    Current Diagnoses: Patient Active Problem List   Diagnosis Date Noted   Closed compression fracture of body of L1 vertebra (HCC) 05/06/2021   Sepsis (HCC) 05/06/2021   HLD (hyperlipidemia) 05/06/2021   Chronic diastolic CHF (congestive heart failure) (HCC) 05/06/2021   Diarrhea 05/06/2021   Bilateral lower leg cellulitis 05/06/2021   Urinary retention    Acute stroke due to ischemia (HCC) 02/13/2021   Stroke (HCC) 02/12/2021   Fall at home, initial encounter 02/12/2021   Hypokalemia 02/12/2021   Leukocytosis 02/12/2021   Fx humeral neck, left, closed, initial encounter    Lymphedema 05/20/2018   Venous ulcer of ankle, left (HCC) 02/11/2018   Venous insufficiency 12/24/2017   Essential hypertension 12/24/2017   Umbilical hernia without obstruction and without gangrene 12/24/2017    Orientation RESPIRATION BLADDER Height & Weight     Self, Time, Situation, Place  Normal Continent Weight: 102.1 kg Height:  6' (182.9 cm)  BEHAVIORAL SYMPTOMS/MOOD NEUROLOGICAL BOWEL NUTRITION STATUS      Continent Diet (regular)  AMBULATORY STATUS COMMUNICATION OF NEEDS Skin   Extensive Assist Verbally Normal                       Personal Care Assistance Level of Assistance  Bathing, Feeding, Dressing Bathing  Assistance: Limited assistance Feeding assistance: Independent Dressing Assistance: Limited assistance     Functional Limitations Info             SPECIAL CARE FACTORS FREQUENCY  PT (By licensed PT), OT (By licensed OT)     PT Frequency: 5 times per week OT Frequency: 5 times per week            Contractures Contractures Info: Not present    Additional Factors Info  Code Status, Allergies Code Status Info: Full code Allergies Info: Erythromycin, Ofloxacin, Cephalexin, Clindamycin Hcl, Doxycycline Calcium, Sulfamethoxazole-trimethoprim, Tetracycline           Current Medications (05/08/2021):  This is the current hospital active medication list Current Facility-Administered Medications  Medication Dose Route Frequency Provider Last Rate Last Admin   acetaminophen (TYLENOL) tablet 650 mg  650 mg Oral Q6H PRN Lorretta Harp, MD       amLODipine (NORVASC) tablet 5 mg  5 mg Oral Daily Lorretta Harp, MD   5 mg at 05/08/21 4825   aspirin chewable tablet 81 mg  81 mg Oral Daily Lorretta Harp, MD   81 mg at 05/08/21 0037   atorvastatin (LIPITOR) tablet 40 mg  40 mg Oral Daily Lorretta Harp, MD   40 mg at 05/08/21 0488   Chlorhexidine Gluconate Cloth 2 % PADS 6 each  6 each Topical Daily Marrion Coy, MD   6 each at 05/07/21 1655   enoxaparin (LOVENOX) injection 50 mg  50 mg Subcutaneous Daily Marrion Coy, MD  50 mg at 05/08/21 0853   hydrALAZINE (APRESOLINE) injection 5 mg  5 mg Intravenous Q2H PRN Lorretta Harp, MD       lidocaine (LIDODERM) 5 % 1 patch  1 patch Transdermal Q24H Gilles Chiquito, MD   1 patch at 05/06/21 1510   methocarbamol (ROBAXIN) tablet 500 mg  500 mg Oral Q8H PRN Lorretta Harp, MD       morphine 2 MG/ML injection 2 mg  2 mg Intravenous Q4H PRN Lorretta Harp, MD       ondansetron [facility]) injection 4 mg  4 mg Intravenous Q8H PRN Lorretta Harp, MD       oxyCODONE-acetaminophen (PERCOCET/ROXICET) 5-325 MG per tablet 1 tablet  1 tablet Oral Q4H PRN Lorretta Harp, MD   1 tablet at  05/08/21 0610   simethicone (MYLICON) chewable tablet 80 mg  80 mg Oral QID PRN Marrion Coy, MD       vancomycin Luna Kitchens) IVPB 1500 mg/300 mL  1,500 mg Intravenous Q12H Rauer, Robyne Peers, RPH 150 mL/hr at 05/08/21 0858 1,500 mg at 05/08/21 2725     Discharge Medications: Please see discharge summary for a list of discharge medications.  Relevant Imaging Results:  Relevant Lab Results:   Additional Information SS# [id number]  Marlowe Sax, RN

## 2021-05-08 NOTE — TOC Progression Note (Signed)
Transition of Care [facility]) - Progression Note    Patient Details  Name: Brandon Hooper MRN: [id number] Date of Birth: 1946/03/06  Transition of Care [facility]) CM/SW Branson West, RN Phone Number: 05/08/2021, 10:04 AM  Clinical Narrative:    Met with the patient to discuss DC plana nd needs He stated that he is ok with doing a bed search but still prefers to go home with his wife.  He will discuss with wife once get bed options Bedsearch sent, Fl2 done, PASSR Obtained        Expected Discharge Plan and Services                                                 Social Determinants of Health (SDOH) Interventions    Readmission Risk Interventions No flowsheet data found.

## 2021-05-08 NOTE — Progress Notes (Signed)
Physical Therapy Treatment Patient Details Name: Brandon Hooper MRN: [id number] DOB: October 16, 1945 Today's Date: 05/08/2021   History of Present Illness Pt is a 76 y.o. male with medical history significant of hypertension, hyperlipidemia, stroke (Nov 2022), chronic venous insufficiency of legs, hiatal hernia, chronic back pain, dCHF, who presents with back pain and bilateral leg swelling.  Patient was found to have sepsis secondary to bilateral lower leg cellulitis. Patient also found to have L1 compression fracture on CT scan (IR scheduled outpatient kyphoplasty; pt to wear back brace for comfort when ambulating)    PT Comments    Pt was pleasant and motivated to participate during the session and put forth good effort throughout. Pt required extensive +2 assist with bed mobility training and transfer training.  Once in standing the pt was able to maintain balance and ambulate a short distance without physical assist but did need cuing on general sequencing for safety with the RW. Pt reported that his general back pain is somewhat better than yesterday. Pt will benefit from PT services in a SNF setting upon discharge to safely address deficits listed in patient problem list for decreased caregiver assistance and eventual return to PLOF.     Recommendations for follow up therapy are one component of a multi-disciplinary discharge planning process, led by the attending physician.  Recommendations may be updated based on patient status, additional functional criteria and insurance authorization.  Follow Up Recommendations  Skilled nursing-short term rehab (<3 hours/day)     Assistance Recommended at Discharge Frequent or constant Supervision/Assistance  Patient can return home with the following Two people to help with walking and/or transfers;A lot of help with bathing/dressing/bathroom;Assistance with cooking/housework;Help with stairs or ramp for entrance   Equipment Recommendations  Rolling  walker (2 wheels)    Recommendations for Other Services       Precautions / Restrictions Precautions Precautions: Fall Required Braces or Orthoses: Spinal Brace Spinal Brace: Lumbar corset Restrictions Weight Bearing Restrictions: No Other Position/Activity Restrictions: To wear lumbar corset for comfort when walking; pt unable to breathe with TLSO, ok for lumbar corset instead of TLSO per MD     Mobility  Bed Mobility Overal bed mobility: Needs Assistance Bed Mobility: Rolling, Sidelying to Sit Rolling: Min assist Sidelying to sit: Mod assist, Max assist, +2 for physical assistance       General bed mobility comments: +2 Mod to max A for BLE and trunk control during sidelying to sit    Transfers Overall transfer level: Needs assistance Equipment used: Rolling walker (2 wheels) Transfers: Sit to/from Stand Sit to Stand: Mod assist, +2 physical assistance, From elevated surface           General transfer comment: +2 mod A to stand with cues for sequencing    Ambulation/Gait Ambulation/Gait assistance: Min guard Gait Distance (Feet): 3 Feet Assistive device: Rolling walker (2 wheels) Gait Pattern/deviations: Step-to pattern, Decreased step length - right, Decreased step length - left Gait velocity: decreased     General Gait Details: Pt able to take several small steps at the EOB and then bed to chair with CGA only and cues for amb closer to the RW for improved UE support; very slow cadence with frequent short standing rest breaks   Stairs             Wheelchair Mobility    Modified Rankin (Stroke Patients Only)       Balance Overall balance assessment: Needs assistance Sitting-balance support: Single extremity supported, Feet supported Sitting balance-Leahy  Scale: Fair     Standing balance support: Bilateral upper extremity supported, During functional activity Standing balance-Leahy Scale: Fair                               Cognition Arousal/Alertness: Awake/alert Behavior During Therapy: WFL for tasks assessed/performed Overall Cognitive Status: Within Functional Limits for tasks assessed                                          Exercises Total Joint Exercises Marching in Standing: AROM, Strengthening, Both, Standing, 5 reps Other Exercises Other Exercises: Log roll training Other Exercises: Pt education on physiological benefits of activity and chair time    General Comments        Pertinent Vitals/Pain Pain Assessment Pain Assessment: No/denies pain Pain Score: 5  Pain Location: lower back Pain Descriptors / Indicators: Sore Pain Intervention(s): Repositioned, Premedicated before session, Monitored during session    Home Living                          Prior Function            PT Goals (current goals can now be found in the care plan section) Progress towards PT goals: Progressing toward goals    Frequency    Min 2X/week      PT Plan Current plan remains appropriate    Co-evaluation              AM-PAC PT "6 Clicks" Mobility   Outcome Measure  Help needed turning from your back to your side while in a flat bed without using bedrails?: A Little Help needed moving from lying on your back to sitting on the side of a flat bed without using bedrails?: A Lot Help needed moving to and from a bed to a chair (including a wheelchair)?: A Lot Help needed standing up from a chair using your arms (e.g., wheelchair or bedside chair)?: A Lot Help needed to walk in hospital room?: Total Help needed climbing 3-5 steps with a railing? : Total 6 Click Score: 11    End of Session Equipment Utilized During Treatment: Back brace;Gait belt Activity Tolerance: Patient tolerated treatment well Patient left: in chair;with call bell/phone within reach;with chair alarm set Nurse Communication: Mobility status PT Visit Diagnosis: Unsteadiness on feet  (R26.81);Other abnormalities of gait and mobility (R26.89);Muscle weakness (generalized) (M62.81);Difficulty in walking, not elsewhere classified (R26.2)     Time: 9147-8295 PT Time Calculation (min) (ACUTE ONLY): 25 min  Charges:  $Gait Training: 8-22 mins $Therapeutic Activity: 8-22 mins                     D. Scott Carmichael Burdette PT, DPT 05/08/21, 5:21 PM

## 2021-05-08 NOTE — Progress Notes (Signed)
PROGRESS NOTE    Brandon Hooper  UDT:[id number] DOB: March 09, 1946 DOA: 05/06/2021 PCP: Derinda Late, MD    Brief Narrative:  Brandon Hooper is a 76 y.o. male with medical history significant of hypertension, hyperlipidemia, stroke, chronic venous insufficiency of legs, hiatal hernia, chronic back pain, dCHF, who presents with back pain and bilateral leg swelling. Patient is diagnosed with cellulitis, was started on vancomycin for Patient also had L1 compression fracture on CT scan.  IR has already scheduled outpatient kyphoplasty.   Assessment & Plan:   Principal Problem:   Closed compression fracture of body of L1 vertebra (HCC) Active Problems:   Essential hypertension   Stroke (HCC)   Hypokalemia   Sepsis (HCC)   HLD (hyperlipidemia)   Chronic diastolic CHF (congestive heart failure) (HCC)   Diarrhea   Bilateral lower leg cellulitis   Urinary retention    L1 lumbar compression fracture. Patient is referred to follow-up outpatient for kyphoplasty.  Continue pain medicine.  Sepsis secondary to cellulitis. Bilateral lower leg cellulitis. Improving, continue vancomycin for another day.  Will change to oral antibiotics tomorrow.  Chronic diastolic congestive heart failure. Stable, no volume overload.  Diarrhea. Condition improved, C. difficile unlikely.  Essential hypertension Continue home medicines.   Hypokalemia. Mild hyponatremia. Pleated k       DVT prophylaxis: Lovenox Code Status: full Family Communication: Met the patient wife yesterday, updated. Disposition Plan: Pending nursing home placement.     Status is: Inpatient   Remains inpatient appropriate because: Severity of disease, IV antibiotics.       I/O last 3 completed shifts: In: 500 [IV Piggyback:500] Out: 4800 [Urine:4800] Total I/O In: -  Out: 600 [Urine:600]     Consultants:  IR  Procedures: None  Antimicrobials: Vancomycin  Subjective: Patient is doing much better.   Leg pain has essentially resolved. Still has some back pain which is controlled with pain medicine. No fever or chills. No short of breath or cough.  Objective: Vitals:   05/07/21 0945 05/07/21 2001 05/08/21 0602 05/08/21 0822  BP: 121/60 133/62 134/66 127/66  Pulse: 78 96 85 90  Resp: _0 Temp: 98.1 F (36.7 C) 98 F (36.7 C) 98.7 F (37.1 C) 97.6 F (36.4 C)  TempSrc:  Oral    SpO2: 98% 97% 95% 95%  Weight:      Height:        Intake/Output Summary (Last 24 hours) at 05/08/2021 1107 Last data filed at 05/08/2021 1025 Gross per 24 hour  Intake 300 ml  Output 4200 ml  Net -3900 ml   Filed Weights   05/06/21 0723  Weight: 102.1 kg    Examination:  General exam: Appears calm and comfortable  Respiratory system: Clear to auscultation. Respiratory effort normal. Cardiovascular system: S1 & S2 heard, RRR. No JVD, murmurs, rubs, gallops or clicks. No pedal edema. Gastrointestinal system: Abdomen is nondistended, soft and nontender. No organomegaly or masses felt. Normal bowel sounds heard. Central nervous system: Alert and oriented x3. No focal neurological deficits. Extremities: Symmetric 5 x 5 power. Skin: No rashes, lesions or ulcers Psychiatry: Judgement and insight appear normal. Mood & affect appropriate.     Data Reviewed: I have personally reviewed following labs and imaging studies  CBC: Recent Labs  Lab 05/06/21 0728 05/07/21 0520  WBC 12.4* 10.3  HGB 12.3* 11.6*  HCT 37.6* 34.7*  MCV 91.5 89.7  PLT 306 972   Basic Metabolic Panel: Recent Labs  Lab 05/06/21 0728 05/07/21 0520 05/08/21  0443  NA 136 133* 137  K 3.3* 3.5 3.9  CL 101 104 104  CO2 _0 GLUCOSE 95 97 81  BUN _1 CREATININE 0.81 0.53* 0.64  CALCIUM 8.6* 8.0* 8.3*  MG 2.2  --   --    GFR: Estimated Creatinine Clearance: 101.7 mL/min (by C-G formula based on SCr of 0.64 mg/dL). Liver Function Tests: Recent Labs  Lab 05/06/21 0728  AST 68*  ALT 22  ALKPHOS  96  BILITOT 1.7*  PROT 7.6  ALBUMIN 3.3*   Recent Labs  Lab 05/06/21 0728  LIPASE 22   No results for input(s): AMMONIA in the last 168 hours. Coagulation Profile: Recent Labs  Lab 05/06/21 2046  INR 1.0   Cardiac Enzymes: No results for input(s): CKTOTAL, CKMB, CKMBINDEX, TROPONINI in the last 168 hours. BNP (last 3 results) No results for input(s): PROBNP in the last 8760 hours. HbA1C: No results for input(s): HGBA1C in the last 72 hours. CBG: No results for input(s): GLUCAP in the last 168 hours. Lipid Profile: No results for input(s): CHOL, HDL, LDLCALC, TRIG, CHOLHDL, LDLDIRECT in the last 72 hours. Thyroid Function Tests: No results for input(s): TSH, T4TOTAL, FREET4, T3FREE, THYROIDAB in the last 72 hours. Anemia Panel: No results for input(s): VITAMINB12, FOLATE, FERRITIN, TIBC, IRON, RETICCTPCT in the last 72 hours. Sepsis Labs: Recent Labs  Lab 05/06/21 1526 05/06/21 1712 05/06/21 2046  PROCALCITON <0.10  --   --   LATICACIDVEN  --  1.4 2.4*    Recent Results (from the past 240 hour(s))  Aerobic/Anaerobic Culture w Gram Stain (surgical/deep wound)     Status: None (Preliminary result)   Collection Time: 05/06/21  3:42 PM   Specimen: Leg  Result Value Ref Range Status   Specimen Description   Final    LEG LEFT Performed at [facility], [address] [place], [place] [address]    Special Requests   Final    NONE Performed at [facility], [address] [place], [place] [address]    Gram Stain   Final    NO SQUAMOUS EPITHELIAL CELLS SEEN RARE WBC SEEN NO ORGANISMS SEEN Performed at [facility], [place] [address] [place],  [address]    Culture   Final    FEW STAPHYLOCOCCUS AUREUS SUSCEPTIBILITIES TO FOLLOW NO ANAEROBES ISOLATED; CULTURE IN PROGRESS FOR 5 DAYS    Report Status PENDING  Incomplete  Resp Panel by RT-PCR (Flu A&B, Covid) Nasopharyngeal Swab     Status: None   Collection Time: 05/06/21  5:05 PM    Specimen: Nasopharyngeal Swab; Nasopharyngeal(NP) swabs in vial transport medium  Result Value Ref Range Status   SARS Coronavirus 2 by RT PCR NEGATIVE NEGATIVE Final    Comment: (NOTE) SARS-CoV-2 target nucleic acids are NOT DETECTED.  The SARS-CoV-2 RNA is generally detectable in upper respiratory specimens during the acute phase of infection. The lowest concentration of SARS-CoV-2 viral copies this assay can detect is 138 copies/mL. A negative result does not preclude SARS-Cov-2 infection and should not be used as the sole basis for treatment or other patient management decisions. A negative result may occur with  improper specimen collection/handling, submission of specimen other than nasopharyngeal swab, presence of viral mutation(s) within the areas targeted by this assay, and inadequate number of viral copies(<138 copies/mL). A negative result must be combined with clinical observations, patient history, and epidemiological information. The expected result is Negative.  Fact Sheet for Patients:  EntrepreneurPulse.com.au  Fact Sheet for Healthcare Providers:  IncredibleEmployment.be  This test is no t yet approved or cleared by the [place] FDA and  has been authorized for detection and/or diagnosis of SARS-CoV-2 by FDA under an Emergency Use Authorization (EUA). This EUA will remain  in effect (meaning this test can be used) for the duration of the COVID-19 declaration under Section 564(b)(1) of the Act, 21 U.S.C.section 360bbb-3(b)(1), unless the authorization is terminated  or revoked sooner.       Influenza A by PCR NEGATIVE NEGATIVE Final   Influenza B by PCR NEGATIVE NEGATIVE Final    Comment: (NOTE) The Xpert Xpress SARS-CoV-2/FLU/RSV plus assay is intended as an aid in the diagnosis of influenza from Nasopharyngeal swab specimens and should not be used as a sole basis for treatment. Nasal washings and aspirates are  unacceptable for Xpert Xpress SARS-CoV-2/FLU/RSV testing.  Fact Sheet for Patients: EntrepreneurPulse.com.au  Fact Sheet for Healthcare Providers: IncredibleEmployment.be  This test is not yet approved or cleared by the [place] FDA and has been authorized for detection and/or diagnosis of SARS-CoV-2 by FDA under an Emergency Use Authorization (EUA). This EUA will remain in effect (meaning this test can be used) for the duration of the COVID-19 declaration under Section 564(b)(1) of the Act, 21 U.S.C. section 360bbb-3(b)(1), unless the authorization is terminated or revoked.  Performed at [facility], [address] [place], [place] [address]   Culture, blood (x 2)     Status: None (Preliminary result)   Collection Time: 05/06/21  5:12 PM   Specimen: BLOOD  Result Value Ref Range Status   Specimen Description BLOOD RIGHT ANTECUBITAL  Final   Special Requests   Final    BOTTLES DRAWN AEROBIC AND ANAEROBIC Blood Culture results may not be optimal due to an excessive volume of blood received in culture bottles   Culture   Final    NO GROWTH 2 DAYS Performed at [facility], [address], [place], [place] [address]    Report Status PENDING  Incomplete  Culture, blood (x 2)     Status: None (Preliminary result)   Collection Time: 05/06/21  8:46 PM   Specimen: BLOOD  Result Value Ref Range Status   Specimen Description BLOOD BLOOD LEFT WRIST  Final   Special Requests   Final    BOTTLES DRAWN AEROBIC AND ANAEROBIC Blood Culture adequate volume   Culture   Final    NO GROWTH 2 DAYS Performed at [facility], [address], [place], [place] [address]    Report Status PENDING  Incomplete         Radiology Studies: DG Chest 2 View  Result Date: 05/06/2021 CLINICAL DATA:  Pt c/o weakness since last night. EMS reports bilateral edema and warmth in his legs. Pt is supposed to be taking amoxicillin but pt  stopped taking them for diarrhea EXAM: CHEST - 2 VIEW COMPARISON:  02/12/2021 FINDINGS: Cardiac silhouette is normal in size. No mediastinal or hilar masses. No evidence of adenopathy. Mild, left greater than right, linear lung base opacities consistent with atelectasis. Remainder of the lungs is clear. No pleural effusion or pneumothorax. Skeletal structures are grossly intact. IMPRESSION: No active cardiopulmonary disease. Electronically Signed   By: Lajean Manes M.D.   On: 05/06/2021 12:27   DG Thoracic Spine 2 View  Result Date: 05/06/2021 CLINICAL DATA:  Fall in late December.  Continued back pain. EXAM: THORACIC SPINE 2 VIEWS COMPARISON:  None. FINDINGS: No fracture  or bone lesion.  Osseous structures are demineralized. No spondylolisthesis. Loss of disc height with endplate osteophytes throughout the thoracic spine. Soft tissues are unremarkable. IMPRESSION: 1. No fracture or acute finding.  No spondylolisthesis. 2. Disc degenerative changes. Electronically Signed   By: Lajean Manes M.D.   On: 05/06/2021 15:14   DG Lumbar Spine Complete  Result Date: 05/06/2021 CLINICAL DATA:  Fall in late December.  Continued low back pain. EXAM: LUMBAR SPINE - COMPLETE 4+ VIEW COMPARISON:  02/12/2021. FINDINGS: Fracture of L1, with approximately 30% decrease in the anterior and central vertebral body height, posterior vertebral body height maintained. This is new compared to the prior radiographs. No other fractures.  No spondylolisthesis. Mild loss of disc height at L4-L5. Remaining lumbar discs are well preserved in height. Small endplate osteophytes noted from L2 through L5. IMPRESSION: 1. Mild to moderate compression fracture of L1, which is new since the prior radiographs, and consistent with a fracture from the fall in late December 2022. 2. No other fractures and no other change. Electronically Signed   By: Lajean Manes M.D.   On: 05/06/2021 15:17   DG Sacrum/Coccyx  Result Date: 05/06/2021 CLINICAL  DATA:  Fall in late December 2022. Continued low back pain. EXAM: SACRUM AND COCCYX - 2+ VIEW COMPARISON:  None. FINDINGS: No fracture or bone lesion. SI joints normally spaced and aligned. Soft tissues are unremarkable. IMPRESSION: No fracture. Electronically Signed   By: Lajean Manes M.D.   On: 05/06/2021 15:18   CT Lumbar Spine Wo Contrast  Result Date: 05/06/2021 CLINICAL DATA:  Compression fracture, back pain EXAM: CT LUMBAR SPINE WITHOUT CONTRAST TECHNIQUE: Multidetector CT imaging of the lumbar spine was performed without intravenous contrast administration. Multiplanar CT image reconstructions were also generated. RADIATION DOSE REDUCTION: This exam was performed according to the departmental dose-optimization program which includes automated exposure control, adjustment of the mA and/or kV according to patient size and/or use of iterative reconstruction technique. COMPARISON:  Lumbar spine radiographs 02/12/2021, 05/06/2021 FINDINGS: Segmentation: Standard; the lowest formed disc space is designated L5-S1 Alignment: Normal. Vertebrae: There is a transverse fracture through the superior aspect of the L1 vertebral body with involvement of the anterior and superior endplates. There is no extension to the posterior elements. There is mild bony retropulsion and mild loss of vertebral body height. These findings are new since 02/12/2021. The other vertebral body heights are preserved. There is no suspicious osseous abnormality. Paraspinal and other soft tissues: The paraspinal soft tissues are unremarkable. There is calcified atherosclerotic plaque in the infrarenal abdominal aorta. There is a moderate stool burden in the rectum. Disc levels: The disc heights are overall preserved. There is mild degenerative endplate change in the lumbar spine. There is mild multilevel facet arthropathy, most advanced at L5-S1. There is mild spinal canal stenosis at T12-L1 due to the bony retropulsion. Otherwise, there is no  significant spinal canal or neural foraminal stenosis. IMPRESSION: 1. Acute appearing fracture of the L1 vertebral body involving the anterior and superior endplates with mild bony retropulsion and mild loss of vertebral body height. The retropulsion results in mild spinal canal stenosis. There is no evidence of extension into the posterior elements. 2. Moderate stool burden in the rectum. 3.  Aortic Atherosclerosis (ICD10-I70.0). Electronically Signed   By: Valetta Mole M.D.   On: 05/06/2021 16:06   MR Lumbar Spine W Wo Contrast  Result Date: 05/07/2021 CLINICAL DATA:  L1 fracture EXAM: MRI LUMBAR SPINE WITHOUT AND WITH CONTRAST TECHNIQUE: Multiplanar and  multiecho pulse sequences of the lumbar spine were obtained without and with intravenous contrast. CONTRAST:  66m GADAVIST GADOBUTROL 1 MMOL/ML IV SOLN COMPARISON:  Lumbar spine CT 05/06/2021 FINDINGS: Segmentation:  Standard Alignment:  Normal Vertebrae: Mild wedge compression fracture of L1 with less than 25% anterior height loss. Contrast enhancement within the vertebral bodies likely reactive. There is hyperintense T2-weighted signal underlying the superior endplate suggesting subacute timeline. Conus medullaris and cauda equina: Conus extends to the L1 level. Conus and cauda equina appear normal. Paraspinal and other soft tissues: Fatty atrophy of the paraspinous muscles Disc levels: T12-L1: Minimal retropulsion of the L1 superior endplate fracture without spinal canal or neural foraminal stenosis. L1-L2: Normal disc space and facet joints. No spinal canal stenosis. No neural foraminal stenosis. L2-L3: Normal disc space and facet joints. No spinal canal stenosis. No neural foraminal stenosis. L3-L4: Normal disc space and facet joints. No spinal canal stenosis. No neural foraminal stenosis. L4-L5: Small left asymmetric disc bulge. No spinal canal stenosis. No neural foraminal stenosis. L5-S1: Disc desiccation without herniation. No spinal canal stenosis.  No neural foraminal stenosis. Visualized sacrum: Normal. IMPRESSION: 1. Subacute wedge compression fracture of the L1 superior endplate with less than 25% anterior height loss and minimal retropulsion. No spinal canal or neural foraminal stenosis. 2. Mild L4-L5 and L5-S1 degenerative disc disease without spinal canal or neural foraminal stenosis. Electronically Signed   By: KUlyses JarredM.D.   On: 05/07/2021 19:37   UK[place]Venous Img Lower Bilateral (DVT)  Result Date: 05/07/2021 CLINICAL DATA:  Bilateral lower extremity cellulitis EXAM: BILATERAL LOWER EXTREMITY VENOUS DOPPLER ULTRASOUND TECHNIQUE: Gray-scale sonography with graded compression, as well as color Doppler and duplex ultrasound were performed to evaluate the lower extremity deep venous systems from the level of the common femoral vein and including the common femoral, femoral, profunda femoral, popliteal and calf veins including the posterior tibial, peroneal and gastrocnemius veins when visible. The superficial great saphenous vein was also interrogated. Spectral Doppler was utilized to evaluate flow at rest and with distal augmentation maneuvers in the common femoral, femoral and popliteal veins. COMPARISON:  CT L-spine, 05/06/2021. LEFT lower extremity venous duplex report, 10/08/2011 FINDINGS: Suboptimal evaluation, with poor acoustic penetration secondary to patient habitus. RIGHT LOWER EXTREMITY VENOUS Normal compressibility of the RIGHT common femoral, superficial femoral, and popliteal veins, as well as the visualized calf veins. Visualized portions of profunda femoral vein and great saphenous vein unremarkable. No filling defects to suggest DVT on grayscale or color Doppler imaging. Doppler waveforms show normal direction of venous flow, normal respiratory plasticity and response to augmentation. OTHER No evidence of superficial thrombophlebitis or abnormal fluid collection. Limitations: none LEFT LOWER EXTREMITY VENOUS Normal compressibility  of the LEFT common femoral, superficial femoral, and popliteal veins, as well as the visualized calf veins. Visualized portions of profunda femoral vein and great saphenous vein unremarkable. No filling defects to suggest DVT on grayscale or color Doppler imaging. Doppler waveforms show normal direction of venous flow, normal respiratory plasticity and response to augmentation. OTHER No evidence of superficial thrombophlebitis or abnormal fluid collection. Limitations: none IMPRESSION: Suboptimal evaluation, within these constraints; No evidence of femoropopliteal DVT within either lower extremity. JMichaelle Birks MD [facility] Electronically Signed   By: JMichaelle BirksM.D.   On: 05/07/2021 07:57   DG ABD ACUTE 2+V W 1V CHEST  Result Date: 05/07/2021 CLINICAL DATA:  Abdominal pain and distention. EXAM: DG ABDOMEN ACUTE WITH 1 VIEW CHEST COMPARISON:  None. FINDINGS:  There is no evidence of dilated bowel loops or free intraperitoneal air. No radiopaque calculi or other significant radiographic abnormality is seen. Heart size and mediastinal contours are within normal limits. Both lungs are clear. IMPRESSION: Negative abdominal radiographs.  No acute cardiopulmonary disease. Electronically Signed   By: Marijo Conception M.D.   On: 05/07/2021 17:11        Scheduled Meds:  amLODipine  5 mg Oral Daily   aspirin  81 mg Oral Daily   atorvastatin  40 mg Oral Daily   Chlorhexidine Gluconate Cloth  6 each Topical Daily   enoxaparin (LOVENOX) injection  50 mg Subcutaneous Daily   lidocaine  1 patch Transdermal Q24H   Continuous Infusions:  vancomycin 1,500 mg (05/08/21 0858)     LOS: 2 days    Time spent: 27 minutes    Sharen Hones, MD [facility]   To contact the attending provider between 7A-7P or the covering provider during after hours 7P-7A, please log into the web site [email or web address] and access using universal [place] password  for that web site. If you do not have the password, please call the hospital operator.  05/08/2021, 11:07 AM

## 2021-05-09 LAB — CBC WITH DIFFERENTIAL/PLATELET
Abs Immature Granulocytes: 0.02 10*3/uL (ref 0.00–0.07)
Basophils Absolute: 0 10*3/uL (ref 0.0–0.1)
Basophils Relative: 1 %
Eosinophils Absolute: 1.3 10*3/uL — ABNORMAL HIGH (ref 0.0–0.5)
Eosinophils Relative: 15 %
HCT: 35.8 % — ABNORMAL LOW (ref 39.0–52.0)
Hemoglobin: 11.8 g/dL — ABNORMAL LOW (ref 13.0–17.0)
Immature Granulocytes: 0 %
Lymphocytes Relative: 23 %
Lymphs Abs: 2 10*3/uL (ref 0.7–4.0)
MCH: 29.4 pg (ref 26.0–34.0)
MCHC: 33 g/dL (ref 30.0–36.0)
MCV: 89.3 fL (ref 80.0–100.0)
Monocytes Absolute: 0.8 10*3/uL (ref 0.1–1.0)
Monocytes Relative: 10 %
Neutro Abs: 4.3 10*3/uL (ref 1.7–7.7)
Neutrophils Relative %: 51 %
Platelets: 331 10*3/uL (ref 150–400)
RBC: 4.01 MIL/uL — ABNORMAL LOW (ref 4.22–5.81)
RDW: 14.3 % (ref 11.5–15.5)
WBC: 8.5 10*3/uL (ref 4.0–10.5)
nRBC: 0 % (ref 0.0–0.2)

## 2021-05-09 LAB — BASIC METABOLIC PANEL
Anion gap: 8 (ref 5–15)
BUN: 8 mg/dL (ref 8–23)
CO2: 24 mmol/L (ref 22–32)
Calcium: 8.1 mg/dL — ABNORMAL LOW (ref 8.9–10.3)
Chloride: 105 mmol/L (ref 98–111)
Creatinine, Ser: 0.66 mg/dL (ref 0.61–1.24)
GFR, Estimated: >60 mL/min (ref >60)
Glucose, Bld: 89 mg/dL (ref 70–99)
Potassium: 3.7 mmol/L (ref 3.5–5.1)
Sodium: 137 mmol/L (ref 135–145)

## 2021-05-09 LAB — MAGNESIUM: Magnesium: 2 mg/dL (ref 1.7–2.4)

## 2021-05-09 MED ORDER — TAMSULOSIN HCL 0.4 MG PO CAPS
0.4000 mg | ORAL_CAPSULE | Freq: Every day | ORAL | Status: DC
  Administered 2021-05-09: 0.4 mg via ORAL
  Filled 2021-05-09: qty 1

## 2021-05-09 MED ORDER — AMLODIPINE BESYLATE 5 MG PO TABS: 5.0000 mg | ORAL_TABLET | Freq: Every day | ORAL | Status: DC

## 2021-05-09 MED ORDER — TAMSULOSIN HCL 0.4 MG PO CAPS: 0.4000 mg | ORAL_CAPSULE | Freq: Every day | ORAL | Status: DC

## 2021-05-09 MED ORDER — OXYCODONE-ACETAMINOPHEN 5-325 MG PO TABS: 1.0000 | ORAL_TABLET | ORAL | 0 refills | Status: DC | PRN

## 2021-05-09 NOTE — Progress Notes (Signed)
Report called and given Victorino Dike, RN at [facility].

## 2021-05-09 NOTE — TOC Progression Note (Signed)
Transition of Care [facility]) - Progression Note    Patient Details  Name: Brandon Hooper MRN: [id number] Date of Birth: 04-16-1945  [facility] CM/SW Contact  Marlowe Sax, RN Phone Number: 05/09/2021, 11:12 AM  Clinical Narrative:   EMS called and asked to come to transport around 1 PM to [facility] room 804, The wife is aware         Expected Discharge Plan and Services           Expected Discharge Date: 05/09/21                                     Social Determinants of Health (SDOH) Interventions    Readmission Risk Interventions No flowsheet data found.

## 2021-05-09 NOTE — Care Management Important Message (Signed)
Important Message  Patient Details  Name: Brandon Hooper MRN: [id number] Date of Birth: 04/23/1945   Medicare Important Message Given:  N/A - LOS <3 / Initial given by admissions     Olegario Messier A Oval Cavazos 05/09/2021, 8:23 AM

## 2021-05-09 NOTE — Discharge Summary (Signed)
Physician Discharge Summary  Patient ID: Brandon Hooper MRN: [id number] DOB/AGE: [birth year] 74 y.o.  Admit date: 05/06/2021 Discharge date: 05/09/2021  Admission Diagnoses:  Discharge Diagnoses:  Principal Problem:   Closed compression fracture of body of L1 vertebra (HCC) Active Problems:   Essential hypertension   Stroke (HCC)   Hypokalemia   Sepsis (HCC)   HLD (hyperlipidemia)   Chronic diastolic CHF (congestive heart failure) (HCC)   Diarrhea   Bilateral lower leg cellulitis   Urinary retention   Discharged Condition: good  Hospital Course:  Brandon Hooper is a 76 y.o. male with medical history significant of hypertension, hyperlipidemia, stroke, chronic venous insufficiency of legs, hiatal hernia, chronic back pain, dCHF, who presents with back pain and bilateral leg swelling. Patient is diagnosed with cellulitis, was started on vancomycin for Patient also had L1 compression fracture on CT scan.  IR has already scheduled outpatient kyphoplasty.   L1 lumbar compression fracture. Patient is referred to follow-up outpatient for kyphoplasty.  Continue pain medicine.  Sepsis secondary to cellulitis. Bilateral lower leg cellulitis. Patient has been treated with vancomycin, condition totally resolved.  At this point, no additional antibiotics needed.  Chronic diastolic congestive heart failure. Stable, no volume overload.  Diarrhea. C. difficile toxin negative.  No additional diarrhea.  Essential hypertension Continue home medicines.   Hypokalemia. Mild hyponatremia. Normalized.  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 Patient had a Foley catheter performed in the emergency room due to urinary retention I will start Flomax, follow-up with urology as outpatient.  Continue Foley catheter.   Consults: IR  Significant Diagnostic Studies:   Treatments: symptomatic treatment  Discharge Exam: Blood pressure 137/79, pulse 83, temperature 98.7 F  (37.1 C), resp. rate 18, height 6' (1.829 m), weight 102.1 kg, SpO2 91 %. General appearance: alert and cooperative Resp: clear to auscultation bilaterally Cardio: regular rate and rhythm, S1, S2 normal, no murmur, click, rub or gallop GI: soft, non-tender; bowel sounds normal; no masses,  no organomegaly Extremities: Lateral lower extremity chronic lymphedema, redness has resolved.  Disposition: Discharge disposition: 03-Skilled Nursing Facility       Discharge Instructions     Diet - low sodium heart healthy   Complete by: As directed    Discharge wound care:   Complete by: As directed    Wash leg with saop and water, pat dry, apply Sween Moisturizing ointment. Place Xeroform gauze over the open wounds, cover with ABD pads. ACE wrap bilateral legs   Increase activity slowly   Complete by: As directed       Allergies as of 05/09/2021       Reactions   Erythromycin Hives   Ofloxacin    Other reaction(s): Other (See Comments) "Arthritic pain". The patient reported 5 years of left hip pain when he last took ofloxacin. Likely could try another fluoroquinolone.    Cephalexin Rash   The patient previously tolerated cephalexin in 2013 and reported in 2019 that he developed redness and itching of his knees while on cephalexin. Not entirely clear it was a true allergic reaction.    Clindamycin Hcl Rash   Doxycycline Calcium Rash   Sulfamethoxazole-trimethoprim Rash   Tetracycline Rash        Medication List     TAKE these medications    amLODipine 5 MG tablet Commonly known as: NORVASC Take 1 tablet (5 mg total) by mouth daily.   aspirin 81 MG chewable tablet Chew 1 tablet (81 mg total) by mouth daily.   atorvastatin 40  MG tablet Commonly known as: LIPITOR Take 1 tablet (40 mg total) by mouth daily.   chlorzoxazone 500 MG tablet Commonly known as: PARAFON Take 500 mg by mouth 4 (four) times daily as needed for muscle spasms (for 10 days).    oxyCODONE-acetaminophen 5-325 MG tablet Commonly known as: PERCOCET/ROXICET Take 1 tablet by mouth every 4 (four) hours as needed for moderate pain.   tamsulosin 0.4 MG Caps capsule Commonly known as: FLOMAX Take 1 capsule (0.4 mg total) by mouth daily.               Discharge Care Instructions  (From admission, onward)           Start     Ordered   05/09/21 0000  Discharge wound care:       Comments: Wash leg with saop and water, pat dry, apply Sween Moisturizing ointment. Place Xeroform gauze over the open wounds, cover with ABD pads. ACE wrap bilateral legs   05/09/21 0934            Contact information for follow-up providers     Roanna Banning, MD Follow up.   Specialties: Interventional Radiology, Diagnostic Radiology, Radiology Why: IR schedulers will call you with appointment date/time for consultation regarding L1 kyphoplasty. Please call with any questions or concerns prior to your appointment. Contact information: [address] [place] [address] [phone]         Sondra Come, MD Follow up in 1 week(s).   Specialty: Urology Why: urinary retention Contact information: [address] [place] [address] [phone]         Kandyce Rud, MD Follow up in 1 week(s).   Specialty: Family Medicine Contact information: [address] [facility] [place] [address] [phone]              Contact information for after-discharge care     Destination     [facility] Preferred SNF .   Service: Skilled Nursing Contact information: [address] [place] [address] [phone]                    35 minutes Signed: Marrion Coy 05/09/2021, 9:35 AM

## 2021-05-12 ENCOUNTER — Ambulatory Visit: Payer: Medicare Other | Admitting: Physician Assistant

## 2021-05-12 LAB — AEROBIC/ANAEROBIC CULTURE W GRAM STAIN (SURGICAL/DEEP WOUND): Gram Stain: NONE SEEN

## 2021-05-14 LAB — CULTURE, BLOOD (ROUTINE X 2)
Culture: NO GROWTH
Culture: NO GROWTH
Special Requests: ADEQUATE

## 2021-05-17 ENCOUNTER — Encounter: Payer: Self-pay | Admitting: *Deleted

## 2021-05-17 ENCOUNTER — Other Ambulatory Visit: Payer: Self-pay | Admitting: Interventional Radiology

## 2021-05-17 ENCOUNTER — Ambulatory Visit
Admission: RE | Admit: 2021-05-17 | Discharge: 2021-05-17 | Disposition: A | Discharge status: Home / Self Care | Payer: Medicare Other | Source: Ambulatory Visit | Attending: Physician Assistant | Admitting: Physician Assistant

## 2021-05-17 ENCOUNTER — Other Ambulatory Visit: Payer: Self-pay

## 2021-05-17 DIAGNOSIS — S32010A Wedge compression fracture of first lumbar vertebra, initial encounter for closed fracture: Secondary | ICD-10-CM

## 2021-05-17 HISTORY — PX: IR RADIOLOGIST EVAL & MGMT: IMG5224

## 2021-05-17 NOTE — H&P (Signed)
Chief Complaint: Patient was seen in consultation today for L1 Lumbar Spine Fracture  at the request of Lorretta HarpNiu, Xilin, MD  Referring Physician(s): Watterson,Shannon A. PA   Virtual Visit via Telephone Note  I connected with Brandon Hooper on 05/17/21  at 10:00 AM EST by telephone and verified that I am speaking with the correct person using two identifiers.  I discussed the limitations, risks, security and privacy concerns of performing an evaluation and management service by telephone and the availability of in person appointments.   History of Present Illness:  Brandon Hooper is a 76 y.o. male  comorbid w PMHx significant for CVA w L cerebellar infarct in 02/2021 and residual deficits including LUE weakness and facial droop, BLE venous insufficiency with lymphedema. Pt presented to [facility] on 05/06/21 with weakness and back pain after recent fall.   His Wife, who was present at the virtual visit, reported that he had a fall on 05/04/21 leading to his presentation. On arrival to [facility] ER he was noted to have BLE cellulitis. Investigations for his back pain included a CT L-spine followed by MR L-spine, which revealed a spinal compression fracture at L1 vertebral body. Vascular and Interventional Radiology was consulted to evaluate for potential minimally invasive treatment with kyphoplasty, but given his acute skin infection treatment was deferred.  He was discharged to a Skilled Nursing Facility ([facility] in [place], [place]) with antibiotic therapy and for wound care, where he still resides at the time of this consultation. He and His Wife report that his legs are doing a lot better, and that he is wearing a spinal brace (TLSO orthotic) while having physical and occupational therapy.   Review of Systems: A 12 point ROS discussed and pertinent positives are indicated in the HPI above.  All other systems are negative.  Past Medical History:  Diagnosis Date   Arthritis    Bone  loss    Hypertension     Past Surgical History:  Procedure Laterality Date   EYE SURGERY Bilateral    Feb 2006 and then in Jan 2009   IR RADIOLOGIST EVAL & MGMT  05/17/2021    Allergies: Erythromycin, Ofloxacin, Cephalexin, Clindamycin hcl, Doxycycline calcium, Sulfamethoxazole-trimethoprim, and Tetracycline  Medications: Prior to Admission medications   Medication Sig Start Date End Date Taking? Authorizing Provider  amLODipine (NORVASC) 5 MG tablet Take 1 tablet (5 mg total) by mouth daily. 05/09/21   Marrion CoyZhang, Dekui, MD  aspirin 81 MG chewable tablet Chew 1 tablet (81 mg total) by mouth daily. 02/14/21   Marrion CoyZhang, Dekui, MD  atorvastatin (LIPITOR) 40 MG tablet Take 1 tablet (40 mg total) by mouth daily. 02/14/21   Marrion CoyZhang, Dekui, MD  chlorzoxazone (PARAFON) 500 MG tablet Take 500 mg by mouth 4 (four) times daily as needed for muscle spasms (for 10 days).    [provider]  oxyCODONE-acetaminophen (PERCOCET/ROXICET) 5-325 MG tablet Take 1 tablet by mouth every 4 (four) hours as needed for moderate pain. 05/09/21   Marrion CoyZhang, Dekui, MD  tamsulosin (FLOMAX) 0.4 MG CAPS capsule Take 1 capsule (0.4 mg total) by mouth daily. 05/09/21   Marrion CoyZhang, Dekui, MD     Family History  Problem Relation Age of Onset   Diabetes Father     Social History   Socioeconomic History   Marital status: Married    Spouse name: Not on file   Number of children: Not on file   Years of education: Not on file   Highest education level: Not  on file  Occupational History   Not on file  Tobacco Use   Smoking status: Never   Smokeless tobacco: Never  Substance and Sexual Activity   Alcohol use: Not Currently   Drug use: Not on file   Sexual activity: Not on file  Other Topics Concern   Not on file  Social History Narrative   Not on file   Social Determinants of Health   Financial Resource Strain: Not on file  Food Insecurity: Not on file  Transportation Needs: Not on file  Physical Activity: Not on file   Stress: Not on file  Social Connections: Not on file   Review of Systems As described above.  Vital Signs: There were no vitals taken for this visit.  Physical Exam  Deferred secondary to virtual appointment.  Imaging:  DG Thoracic Spine 2 View  Result Date: 05/06/2021 CLINICAL DATA:  Fall in late December.  Continued back pain. EXAM: THORACIC SPINE 2 VIEWS COMPARISON:  None. FINDINGS: No fracture or bone lesion.  Osseous structures are demineralized. No spondylolisthesis. Loss of disc height with endplate osteophytes throughout the thoracic spine. Soft tissues are unremarkable. IMPRESSION: 1. No fracture or acute finding.  No spondylolisthesis. 2. Disc degenerative changes. Electronically Signed   By: Amie Portland M.D.   On: 05/06/2021 15:14   DG Lumbar Spine Complete  Result Date: 05/06/2021 CLINICAL DATA:  Fall in late December.  Continued low back pain. EXAM: LUMBAR SPINE - COMPLETE 4+ VIEW COMPARISON:  02/12/2021. FINDINGS: Fracture of L1, with approximately 30% decrease in the anterior and central vertebral body height, posterior vertebral body height maintained. This is new compared to the prior radiographs. No other fractures.  No spondylolisthesis. Mild loss of disc height at L4-L5. Remaining lumbar discs are well preserved in height. Small endplate osteophytes noted from L2 through L5. IMPRESSION: 1. Mild to moderate compression fracture of L1, which is new since the prior radiographs, and consistent with a fracture from the fall in late December 2022. 2. No other fractures and no other change. Electronically Signed   By: Amie Portland M.D.   On: 05/06/2021 15:17   DG Sacrum/Coccyx  Result Date: 05/06/2021 CLINICAL DATA:  Fall in late December 2022. Continued low back pain. EXAM: SACRUM AND COCCYX - 2+ VIEW COMPARISON:  None. FINDINGS: No fracture or bone lesion. SI joints normally spaced and aligned. Soft tissues are unremarkable. IMPRESSION: No fracture. Electronically Signed    By: Amie Portland M.D.   On: 05/06/2021 15:18   CT Lumbar Spine Wo Contrast  Result Date: 05/06/2021 CLINICAL DATA:  Compression fracture, back pain EXAM: CT LUMBAR SPINE WITHOUT CONTRAST TECHNIQUE: Multidetector CT imaging of the lumbar spine was performed without intravenous contrast administration. Multiplanar CT image reconstructions were also generated. RADIATION DOSE REDUCTION: This exam was performed according to the departmental dose-optimization program which includes automated exposure control, adjustment of the mA and/or kV according to patient size and/or use of iterative reconstruction technique. COMPARISON:  Lumbar spine radiographs 02/12/2021, 05/06/2021 FINDINGS: Segmentation: Standard; the lowest formed disc space is designated L5-S1 Alignment: Normal. Vertebrae: There is a transverse fracture through the superior aspect of the L1 vertebral body with involvement of the anterior and superior endplates. There is no extension to the posterior elements. There is mild bony retropulsion and mild loss of vertebral body height. These findings are new since 02/12/2021. The other vertebral body heights are preserved. There is no suspicious osseous abnormality. Paraspinal and other soft tissues: The paraspinal soft tissues are  unremarkable. There is calcified atherosclerotic plaque in the infrarenal abdominal aorta. There is a moderate stool burden in the rectum. Disc levels: The disc heights are overall preserved. There is mild degenerative endplate change in the lumbar spine. There is mild multilevel facet arthropathy, most advanced at L5-S1. There is mild spinal canal stenosis at T12-L1 due to the bony retropulsion. Otherwise, there is no significant spinal canal or neural foraminal stenosis. IMPRESSION: 1. Acute appearing fracture of the L1 vertebral body involving the anterior and superior endplates with mild bony retropulsion and mild loss of vertebral body height. The retropulsion results in mild  spinal canal stenosis. There is no evidence of extension into the posterior elements. 2. Moderate stool burden in the rectum. 3.  Aortic Atherosclerosis (ICD10-I70.0). Electronically Signed   By: Lesia Hausen M.D.   On: 05/06/2021 16:06   MR Lumbar Spine W Wo Contrast  Result Date: 05/07/2021 CLINICAL DATA:  L1 fracture EXAM: MRI LUMBAR SPINE WITHOUT AND WITH CONTRAST TECHNIQUE: Multiplanar and multiecho pulse sequences of the lumbar spine were obtained without and with intravenous contrast. CONTRAST:  7mL GADAVIST GADOBUTROL 1 MMOL/ML IV SOLN COMPARISON:  Lumbar spine CT 05/06/2021 FINDINGS: Segmentation:  Standard Alignment:  Normal Vertebrae: Mild wedge compression fracture of L1 with less than 25% anterior height loss. Contrast enhancement within the vertebral bodies likely reactive. There is hyperintense T2-weighted signal underlying the superior endplate suggesting subacute timeline. Conus medullaris and cauda equina: Conus extends to the L1 level. Conus and cauda equina appear normal. Paraspinal and other soft tissues: Fatty atrophy of the paraspinous muscles Disc levels: T12-L1: Minimal retropulsion of the L1 superior endplate fracture without spinal canal or neural foraminal stenosis. L1-L2: Normal disc space and facet joints. No spinal canal stenosis. No neural foraminal stenosis. L2-L3: Normal disc space and facet joints. No spinal canal stenosis. No neural foraminal stenosis. L3-L4: Normal disc space and facet joints. No spinal canal stenosis. No neural foraminal stenosis. L4-L5: Small left asymmetric disc bulge. No spinal canal stenosis. No neural foraminal stenosis. L5-S1: Disc desiccation without herniation. No spinal canal stenosis. No neural foraminal stenosis. Visualized sacrum: Normal. IMPRESSION: 1. Subacute wedge compression fracture of the L1 superior endplate with less than 25% anterior height loss and minimal retropulsion. No spinal canal or neural foraminal stenosis. 2. Mild L4-L5 and  L5-S1 degenerative disc disease without spinal canal or neural foraminal stenosis. Electronically Signed   By: Deatra Robinson M.D.   On: 05/07/2021 19:37   US Venous Img Lower Bilateral (DVT)  Result Date: 05/07/2021 CLINICAL DATA:  Bilateral lower extremity cellulitis EXAM: BILATERAL LOWER EXTREMITY VENOUS DOPPLER ULTRASOUND TECHNIQUE: Gray-scale sonography with graded compression, as well as color Doppler and duplex ultrasound were performed to evaluate the lower extremity deep venous systems from the level of the common femoral vein and including the common femoral, femoral, profunda femoral, popliteal and calf veins including the posterior tibial, peroneal and gastrocnemius veins when visible. The superficial great saphenous vein was also interrogated. Spectral Doppler was utilized to evaluate flow at rest and with distal augmentation maneuvers in the common femoral, femoral and popliteal veins. COMPARISON:  CT L-spine, 05/06/2021. LEFT lower extremity venous duplex report, 10/08/2011 FINDINGS: Suboptimal evaluation, with poor acoustic penetration secondary to patient habitus. RIGHT LOWER EXTREMITY VENOUS Normal compressibility of the RIGHT common femoral, superficial femoral, and popliteal veins, as well as the visualized calf veins. Visualized portions of profunda femoral vein and great saphenous vein unremarkable. No filling defects to suggest DVT on grayscale or color Doppler imaging.  Doppler waveforms show normal direction of venous flow, normal respiratory plasticity and response to augmentation. OTHER No evidence of superficial thrombophlebitis or abnormal fluid collection. Limitations: none LEFT LOWER EXTREMITY VENOUS Normal compressibility of the LEFT common femoral, superficial femoral, and popliteal veins, as well as the visualized calf veins. Visualized portions of profunda femoral vein and great saphenous vein unremarkable. No filling defects to suggest DVT on grayscale or color Doppler imaging.  Doppler waveforms show normal direction of venous flow, normal respiratory plasticity and response to augmentation. OTHER No evidence of superficial thrombophlebitis or abnormal fluid collection. Limitations: none IMPRESSION: Suboptimal evaluation, within these constraints; No evidence of femoropopliteal DVT within either lower extremity. Roanna Banning, MD [facility] Radiology Electronically Signed   By: Roanna Banning M.D.   On: 05/07/2021 07:57   Imaging Review:  MR L spine, 05/07/21 Independently reviewed demonstrating 5 non-rib bearing, lumbar-type vertebral bodies. Hyperintense STIR signal at L1. Superior endplate vertebral body fx. No canal infiltration.    Labs:  CBC: Recent Labs    02/14/21 0617 05/06/21 0728 05/07/21 0520 05/09/21 0426  WBC 11.1* 12.4* 10.3 8.5  HGB 13.0 12.3* 11.6* 11.8*  HCT 39.2 37.6* 34.7* 35.8*  PLT 200 306 285 331    COAGS: Recent Labs    02/12/21 0754 05/06/21 2046  INR 1.0 1.0  APTT 26 23*    BMP: Recent Labs    05/06/21 0728 05/07/21 0520 05/08/21 0443 05/09/21 0426  NA 136 133* 137 137  K 3.3* 3.5 3.9 3.7  CL 101 104 104 105  CO2 23 22 26 24   GLUCOSE 95 97 81 89  BUN 14 14 8 8   CALCIUM 8.6* 8.0* 8.3* 8.1*  CREATININE 0.81 0.53* 0.64 0.66  GFRNONAA >60 >60 >60 >60    LIVER FUNCTION TESTS: Recent Labs    02/12/21 0754 05/06/21 0728  BILITOT 1.3* 1.7*  AST 22 68*  ALT 11 22  ALKPHOS 66 96  PROT 7.2 7.6  ALBUMIN 3.7 3.3*    Assessment and Plan:  Kordel Leavy is a 76 y.o. male comorbid w PMHx significant for CVA w L cerebellar infarct in 02/2021 and residual deficits including LUE weakness and facial droop, BLE venous insufficiency with lymphedema, who presents with symptomatic L1 vertebral compression fracture.   The Pt is interested in pursuing a minimally-invasive option for therapy of symptomatic spinal fracture at this time with kyphoplasty.   Risks were discussed  including, but not limited to, bleeding, infection, cement migration which may cause spinal cord damage, paralysis, pulmonary embolism or even death.   The procedure has been fully reviewed with the patient/patients authorized representative. The risks, benefits and alternatives have been explained, and the patient/patients authorized representative has consented to the procedure.   *MR L-spine and CT L-spine reviewed. No additional imaging required. *Proceed to schedule based on mutual availability. *Procedure to be performed at [facility]. *Will request Industry Representative presence ([employer] KP) *Same day procedure, no overnight admission. *Ancef for pre op Abx.   Thank you for this interesting consult.  I greatly enjoyed talking with Esteven Fronczak and look forward to participating in their care.  A copy of this report was sent to the requesting provider on this date.   Electronically Signed:  [facility], MD [facility] Radiology   Pager. [phone] Clinic. [phone]  I spent a total of  60 Minutes  of non-face-to-face time in clinical consultation, greater than 50% of which was  counseling/coordinating care for L1 compression fracture treatment.

## 2021-05-25 ENCOUNTER — Ambulatory Visit: Payer: Medicare Other | Admitting: Radiology

## 2021-05-30 ENCOUNTER — Ambulatory Visit: Payer: Medicare Other | Admitting: Radiology

## 2021-05-31 ENCOUNTER — Ambulatory Visit: Payer: Medicare Other | Admitting: Urology

## 2021-05-31 ENCOUNTER — Other Ambulatory Visit: Payer: Self-pay

## 2021-05-31 ENCOUNTER — Ambulatory Visit: Payer: Self-pay | Admitting: Urology

## 2021-06-09 ENCOUNTER — Other Ambulatory Visit: Payer: Self-pay

## 2021-06-09 ENCOUNTER — Encounter: Payer: Self-pay | Admitting: Urology

## 2021-06-09 ENCOUNTER — Ambulatory Visit: Payer: Medicare Other | Admitting: Urology

## 2021-06-09 VITALS — BP 138/74 | HR 102 | Ht 69.0 in | Wt 216.0 lb

## 2021-06-09 DIAGNOSIS — Z87898 Personal history of other specified conditions: Secondary | ICD-10-CM | POA: Diagnosis not present

## 2021-06-09 DIAGNOSIS — R339 Retention of urine, unspecified: Secondary | ICD-10-CM

## 2021-06-09 LAB — BLADDER SCAN AMB NON-IMAGING: Scan Result: 367

## 2021-06-09 NOTE — Progress Notes (Signed)
? ?06/09/2021 ?5:38 PM  ? ?Brandon Hooper ?04/11/1945 ?[id number] ? ?Referring provider: Kandyce Rud, MD ?[address] ?[facility] - Family and Internal Medicine ?[place],  [place] [address] ? ?Chief Complaint  ?Patient presents with  ? Other  ? ? ?HPI: ?Brandon Hooper is a 76 y.o. male who presents in follow-up of recent hospitalization.  His wife was present at today's visit and provided a portion of the history ? ?[facility] ED visit 05/06/2021 with acute worsening of chronic low back pain ?CT lumbar spine showed acute compression fracture L1 vertebra with mild spinal stenosis treated with a brace ?He was noted in the ED to have difficulty voiding and estimated volume by bladder scan was >1 L.  A Foley catheter was placed ?No bothersome LUTS prior to admission ?Discharge to SNF 1/31 with indwelling Foley and was started on tamsulosin ?Scheduled for kyphoplasty in the near future ?He states his catheter was removed on 06/01/2021 and he has been voiding without problems and feels he is back to baseline ? ? ?PMH: ?Past Medical History:  ?Diagnosis Date  ? Arthritis   ? Bone loss   ? Hypertension   ? ? ?Surgical History: ?Past Surgical History:  ?Procedure Laterality Date  ? EYE SURGERY Bilateral   ? Feb 2006 and then in Jan 2009  ? IR RADIOLOGIST EVAL & MGMT  05/17/2021  ? ? ?Home Medications:  ?Allergies as of 06/09/2021   ? ?   Reactions  ? Erythromycin Hives  ? Ofloxacin   ? Other reaction(s): Other (See Comments) ?"Arthritic pain". The patient reported 5 years of left hip pain when he last took ofloxacin. Likely could try another fluoroquinolone.   ? Cephalexin Rash  ? The patient previously tolerated cephalexin in 2013 and reported in 2019 that he developed redness and itching of his knees while on cephalexin. Not entirely clear it was a true allergic reaction.   ? Clindamycin Hcl Rash  ? Doxycycline Calcium Rash  ? Sulfamethoxazole-trimethoprim Rash  ? Tetracycline Rash  ? ?  ? ?  ?Medication List  ?  ? ?  ?  Accurate as of June 09, 2021  5:38 PM. If you have any questions, ask your nurse or doctor.  ?  ?  ? ?  ? ?amLODipine 5 MG tablet ?Commonly known as: NORVASC ?Take 1 tablet (5 mg total) by mouth daily. ?  ?aspirin 81 MG chewable tablet ?Chew 1 tablet (81 mg total) by mouth daily. ?  ?atorvastatin 40 MG tablet ?Commonly known as: LIPITOR ?Take 1 tablet (40 mg total) by mouth daily. ?  ?chlorzoxazone 500 MG tablet ?Commonly known as: PARAFON ?Take 500 mg by mouth 4 (four) times daily as needed for muscle spasms (for 10 days). ?  ?oxyCODONE-acetaminophen 5-325 MG tablet ?Commonly known as: PERCOCET/ROXICET ?Take 1 tablet by mouth every 4 (four) hours as needed for moderate pain. ?  ?tamsulosin 0.4 MG Caps capsule ?Commonly known as: FLOMAX ?Take 1 capsule (0.4 mg total) by mouth daily. ?  ? ?  ? ? ?Allergies:  ?Allergies  ?Allergen Reactions  ? Erythromycin Hives  ? Ofloxacin   ?  Other reaction(s): Other (See Comments) ?"Arthritic pain". The patient reported 5 years of left hip pain when he last took ofloxacin. Likely could try another fluoroquinolone.   ? Cephalexin Rash  ?  The patient previously tolerated cephalexin in 2013 and reported in 2019 that he developed redness and itching of his knees while on cephalexin. Not entirely clear it was  a true allergic reaction.   ? Clindamycin Hcl Rash  ? Doxycycline Calcium Rash  ? Sulfamethoxazole-Trimethoprim Rash  ? Tetracycline Rash  ? ? ?Family History: ?Family History  ?Problem Relation Age of Onset  ? Diabetes Father   ? ? ?Social History:  reports that he has never smoked. He has never used smokeless tobacco. He reports that he does not currently use alcohol. No history on file for drug use. ? ? ?Physical Exam: ?BP 138/74   Pulse (!) 102   Ht 5\' 9"  (1.753 m)   Wt 216 lb (98 kg)   BMI 31.90 kg/m?   ?Constitutional:  Alert and oriented, No acute distress. ?HEENT: [place] AT, moist mucus membranes.  Trachea midline, no masses. ?Cardiovascular: No clubbing, cyanosis, or  edema. ?Respiratory: Normal respiratory effort, no increased work of breathing. ?GI: Abdomen is soft, nontender, nondistended, no abdominal masses ?Neurologic: Grossly intact, no focal deficits, moving all 4 extremities. ?Psychiatric: Normal mood and affect. ? ? ?Assessment & Plan:   ? ?1. Urinary retention ?Bladder scan PVR today 367 mL ?He is asymptomatic ?Possible element of bladder hypotonicity post compression fx ?May also have underlying BPH.  No previous baseline residual ?Recommend 68-month follow-up with repeat PVR ?Instructed to call earlier for worsening voiding symptoms ? ? ?2-month, MD ? ?[facility] ?[address] ?[place], [place] ?[phone] ? ?

## 2021-06-11 ENCOUNTER — Encounter: Payer: Self-pay | Admitting: Urology

## 2021-06-13 ENCOUNTER — Encounter (INDEPENDENT_AMBULATORY_CARE_PROVIDER_SITE_OTHER): Payer: Medicare Other | Admitting: Vascular Surgery

## 2021-08-22 ENCOUNTER — Ambulatory Visit: Payer: Medicare Other | Admitting: Dermatology

## 2021-08-22 DIAGNOSIS — R21 Rash and other nonspecific skin eruption: Secondary | ICD-10-CM | POA: Diagnosis not present

## 2021-08-22 MED ORDER — TRIAMCINOLONE ACETONIDE 0.1 % EX CREA: TOPICAL_CREAM | CUTANEOUS | 1 refills | Status: DC

## 2021-08-22 NOTE — Patient Instructions (Addendum)
Use mild soap like dove for sensitive skin for shower/bath, apply moisturizer after shower / bath.  ? ?Gentle Skin Care Guide ? ?1. Bathe no more than once a day. ? ?2. Avoid bathing in hot water ? ?3. Use a mild soap like Dove, Vanicream, Cetaphil, CeraVe. Can use Lever 2000 or Cetaphil antibacterial soap ? ?4. Use soap only where you need it. On most days, use it under your arms, between your legs, and on your feet. Let the water rinse other areas unless visibly dirty. ? ?5. When you get out of the bath/shower, use a towel to gently blot your skin dry, don't rub it. ? ?6. While your skin is still a little damp, apply a moisturizing cream such as Vanicream, CeraVe, Cetaphil, Eucerin, Sarna lotion or plain Vaseline Jelly. For hands apply Neutrogena [place] Hand Cream or Excipial Hand Cream. ? ?7. Reapply moisturizer any time you start to itch or feel dry. ? ?8. Sometimes using free and clear laundry detergents can be helpful. Fabric softener sheets should be avoided. Downy Free & Gentle liquid, or any liquid fabric softener that is free of dyes and perfumes, it acceptable to use ? ?9. If your doctor has given you prescription creams you may apply moisturizers over them  ? ?For Triamcinolone Cream  ? ?Do not use if no rash  ? ?Avoid applying to face, groin, and axilla. Use as directed.  ? ?Topical steroids (such as triamcinolone, fluocinolone, fluocinonide, mometasone, clobetasol, halobetasol, betamethasone, hydrocortisone) can cause thinning and lightening of the skin if they are used for too long in the same area. Your physician has selected the right strength medicine for your problem and area affected on the body. Please use your medication only as directed by your physician to prevent side effects.  ? ? ? ? ?If You Need Anything After Your Visit ? ?If you have any questions or concerns for your doctor, please call our main line at [phone] and press option 4 to reach your doctor's medical assistant. If no  one answers, please leave a voicemail as directed and we will return your call as soon as possible. Messages left after 4 pm will be answered the following business day.  ? ?You may also send us a message via MyChart. We typically respond to MyChart messages within 1-2 business days. ? ?For prescription refills, please ask your pharmacy to contact our office. Our fax number is [phone]. ? ?If you have an urgent issue when the clinic is closed that cannot wait until the next business day, you can page your doctor at the number below.   ? ?Please note that while we do our best to be available for urgent issues outside of office hours, we are not available 24/7.  ? ?If you have an urgent issue and are unable to reach us, you may choose to seek medical care at your doctor's office, retail clinic, urgent care center, or emergency room. ? ?If you have a medical emergency, please immediately call 911 or go to the emergency department. ? ?Pager Numbers ? ?- Dr. Gwen PoundsKowalski: [phone] ? ?- Dr. Neale BurlyMoye: [phone] ? ?- Dr. Roseanne RenoStewart: [phone] ? ?In the event of inclement weather, please call our main line at [phone] for an update on the status of any delays or closures. ? ?Dermatology Medication Tips: ?Please keep the boxes that topical medications come in in order to help keep track of the instructions about where and how to use these. Pharmacies typically print the medication instructions only  on the boxes and not directly on the medication tubes.  ? ?If your medication is too expensive, please contact our office at [phone] option 4 or send [place] a message through MyChart.  ? ?We are unable to tell what your co-pay for medications will be in advance as this is different depending on your insurance coverage. However, we may be able to find a substitute medication at lower cost or fill out paperwork to get insurance to cover a needed medication.  ? ?If a prior authorization is required to get your medication  covered by your insurance company, please allow [place] 1-2 business days to complete this process. ? ?Drug prices often vary depending on where the prescription is filled and some pharmacies may offer cheaper prices. ? ?The website www.goodrx.com contains coupons for medications through different pharmacies. The prices here do not account for what the cost may be with help from insurance (it may be cheaper with your insurance), but the website can give you the price if you did not use any insurance.  ?- You can print the associated coupon and take it with your prescription to the pharmacy.  ?- You may also stop by our office during regular business hours and pick up a GoodRx coupon card.  ?- If you need your prescription sent electronically to a different pharmacy, notify our office through [facility] or by phone at [phone] option 4. ? ? ? ? ?Si Usted Necesita Algo Despu?s de Su Visita ? ?Tambi?n puede enviarnos un mensaje a trav?s de MyChart. Por lo general respondemos a los mensajes de MyChart en el transcurso de 1 a 2 d?as h?biles. ? ?Para renovar recetas, por favor pida a su farmacia que se ponga en contacto con nuestra oficina. Nuestro n?mero de fax es el [phone]. ? ?Si tiene un asunto urgente cuando la cl?nica est? cerrada y que no puede esperar hasta el siguiente d?a h?bil, puede llamar/localizar a su doctor(a) al n?mero que aparece a continuaci?n.  ? ?Por favor, tenga en cuenta que aunque hacemos todo lo posible para estar disponibles para asuntos urgentes fuera del horario de oficina, no estamos disponibles las 24 horas del d?a, los 7 d?as de la semana.  ? ?Si tiene un problema urgente y no puede comunicarse con nosotros, puede optar por buscar atenci?n m?dica  en el consultorio de su doctor(a), en una cl?nica privada, en un centro de atenci?n urgente o en una sala de emergencias. ? ?Si tiene Radio broadcast assistant m?dica, por favor llame inmediatamente al 911 o vaya a la sala de  emergencias. ? ?N?meros de b?per ? ?- Dr. Gwen Pounds: [phone] ? ?- Dra. Moye: [phone] ? ?- Dra. Roseanne Reno: [phone] ? ?En caso de inclemencias del tiempo, por favor llame a nuestra l?nea principal al [phone] para una actualizaci?n sobre el estado de cualquier retraso o cierre. ? ?Consejos para la medicaci?n en dermatolog?a: ?Por favor, guarde las cajas en las que vienen los medicamentos de uso t?pico para ayudarle a seguir las instrucciones sobre d?nde y c?mo usarlos. Las farmacias generalmente imprimen las instrucciones del medicamento s?lo en las cajas y no directamente en los tubos del [place].  ? ?Si su medicamento es muy caro, por favor, p?ngase en contacto con Rolm Gala llamando al [phone] y presione la opci?n 4 o env?enos un mensaje a trav?s de MyChart.  ? ?No podemos decirle cu?l ser? su copago por los medicamentos por adelantado ya que esto es diferente dependiendo de la cobertura de su seguro. Sin embargo, es posible  que podamos encontrar un medicamento sustituto a Audiological scientist un formulario para que el seguro cubra el medicamento que se considera necesario.  ? ?Si se requiere Neomia Dear autorizaci?n previa para que su compa??a de seguros [place] su medicamento, por favor perm?tanos de 1 a 2 d?as h?biles para completar este proceso. ? ?Los precios de los medicamentos var?an con frecuencia dependiendo del Environmental consultant de d?nde se surte la receta y alguna farmacias pueden ofrecer precios m?s baratos. ? ?El sitio web www.goodrx.com tiene cupones para medicamentos de Health and safety inspector. Los precios aqu? no tienen en cuenta lo que podr?a costar con la ayuda del seguro (puede ser m?s barato con su seguro), pero el sitio web puede darle el precio si no utiliz? ning?n seguro.  ?- Puede imprimir el cup?n correspondiente y llevarlo con su receta a la farmacia.  ?- Tambi?n puede pasar por nuestra oficina durante el horario de atenci?n regular y recoger una tarjeta de cupones de GoodRx.  ?- Si  necesita que su receta se env?e electr?nicamente a Psychiatrist, informe a nuestra oficina a trav?s de MyChart de [place] o por tel?fono llamando al [phone] y presione la opci?n 4. ? ?

## 2021-08-22 NOTE — Progress Notes (Signed)
? ?  New Patient Visit ? ?Subjective  ?Brandon Hooper is a 76 y.o. male who presents for the following: New Patient (Initial Visit) (Patient and wife here today concerning a rash that he developed behind b/l knees in the beginning of April. Reports thought may have been fungal rash and started miconazole cream but did not help and rash spread. Seen by PCP and was prescribed ketoconazole pill. Patient and wife think atorvastatin is causing rash and have stopped taking after talking with his pcp. /Currently using benadryl topical gel to rash. ). Pt denies history of ezcema. ? ?The patient has spots, moles and lesions to be evaluated, some may be new or changing and the patient has concerns that these could be cancer. ? ? ?The following portions of the chart were reviewed this encounter and updated as appropriate:  ?  ?  ? ?Review of Systems:  No other skin or systemic complaints except as noted in HPI or Assessment and Plan. ? ?Objective  ?Well appearing patient in no apparent distress; mood and affect are within normal limits. ? ?A focused examination was performed including b/l lower legs, and left arm. Relevant physical exam findings are noted in the Assessment and Plan. ? ?b/l posterior knees, left arm ?Large lichenified violaceous patches BL popliteal fossa, pink scaly patches on left arm ? ? ? ?Assessment & Plan  ?Rash and other nonspecific skin eruption ?b/l posterior knees, left arm ? ?Dermatitis, possible nummular, vrs reaction to statin medication ? ?Start triamcinolone cream 0.1 % apply topically to aa's of rash at left arm and b/l legs bid for 2 weeks. After 2 weeks if still rash continue to spot treat aa's qd prn.  ?Avoid applying to face, groin, and axilla. Use as directed. Long-term use can cause thinning of the skin. ? ?Topical steroids (such as triamcinolone, fluocinolone, fluocinonide, mometasone, clobetasol, halobetasol, betamethasone, hydrocortisone) can cause thinning and lightening of the skin if  they are used for too long in the same area. Your physician has selected the right strength medicine for your problem and area affected on the body. Please use your medication only as directed by your physician to prevent side effects.  ? ?Discussed using a mild soap like dove sensitive skin at bath/shower and moisturizer like eucerin after bath/shower  ?Gentle Skin Care Guide included in wrap up ? ?Discussed persistent discoloration in area of rash is normal and eventually will fade over time once inflammation has resolved.  ? ? ?triamcinolone cream (KENALOG) 0.1 % - b/l posterior knees, left arm ?Apply topically bid to aa's of left arm and b/l legs for 2 weeks. After 2 weeks if still rash left can use once daily. Avoid applying to face, groin, and axilla. Use as directed. ? ? ?Return in about 4 weeks (around 09/19/2021) for dermatitis follow up. ? ? ?I, Ruthell Rummage, CMA, am acting as scribe for Brendolyn Patty, MD. ? ?Documentation: I have reviewed the above documentation for accuracy and completeness, and I agree with the above. ? ?Brendolyn Patty MD  ? ?

## 2021-09-13 ENCOUNTER — Encounter: Payer: Self-pay | Admitting: Urology

## 2021-09-13 ENCOUNTER — Ambulatory Visit: Payer: Medicare Other | Admitting: Urology

## 2021-09-13 VITALS — BP 140/70 | HR 86 | Ht 69.0 in | Wt 216.0 lb

## 2021-09-13 DIAGNOSIS — R339 Retention of urine, unspecified: Secondary | ICD-10-CM

## 2021-09-13 DIAGNOSIS — Z87898 Personal history of other specified conditions: Secondary | ICD-10-CM

## 2021-09-13 LAB — BLADDER SCAN AMB NON-IMAGING: Scan Result: 492

## 2021-09-15 ENCOUNTER — Other Ambulatory Visit: Payer: Self-pay | Admitting: Physician Assistant

## 2021-09-16 ENCOUNTER — Encounter: Payer: Self-pay | Admitting: Urology

## 2021-09-16 NOTE — Progress Notes (Signed)
09/13/2021 11:25 PM   Brandon Hooper 04/25/1945 [id number]  Referring provider: Kandyce Rud, MD [phone] [facility] - Family and Internal Medicine [place],  [place] [address]  Chief Complaint  Patient presents with   Other    HPI:  76 y.o. male who presents 42-month follow-up.  His wife was present at today's visit and provided a portion of the history  [facility] ED visit 05/06/2021 with acute worsening of chronic low back pain History L1 compression fracture with urinary retention January 2023.  Bladder volume at catheter placement was >1 L.  When seen March 2023 PVR was 367 mL though he was asymptomatic.  He was taking tamsulosin and elected no further treatment He discontinued the tamsulosin due to side effects of increased anxiety and paranoia He has no voiding complaints and states he is doing well Serum creatinine April 2023 was 0.6   PMH: Past Medical History:  Diagnosis Date   Arthritis    Bone loss    Hypertension     Surgical History: Past Surgical History:  Procedure Laterality Date   EYE SURGERY Bilateral    Feb 2006 and then in Jan 2009   IR RADIOLOGIST EVAL & MGMT  05/17/2021    Home Medications:  Allergies as of 09/13/2021       Reactions   Erythromycin Hives   Ofloxacin    Other reaction(s): Other (See Comments) "Arthritic pain". The patient reported 5 years of left hip pain when he last took ofloxacin. Likely could try another fluoroquinolone.    Cephalexin Rash   The patient previously tolerated cephalexin in 2013 and reported in 2019 that he developed redness and itching of his knees while on cephalexin. Not entirely clear it was a true allergic reaction.    Clindamycin Hcl Rash   Doxycycline Calcium Rash   Sulfamethoxazole-trimethoprim Rash   Tetracycline Rash        Medication List        Accurate as of September 13, 2021 11:59 PM. If you have any questions, ask your nurse or doctor.          amLODipine 5 MG tablet Commonly  known as: NORVASC Take 1 tablet (5 mg total) by mouth daily.   aspirin 81 MG chewable tablet Chew 1 tablet (81 mg total) by mouth daily.   atorvastatin 40 MG tablet Commonly known as: LIPITOR Take 1 tablet (40 mg total) by mouth daily.   chlorzoxazone 500 MG tablet Commonly known as: PARAFON Take 500 mg by mouth 4 (four) times daily as needed for muscle spasms (for 10 days).   oxyCODONE-acetaminophen 5-325 MG tablet Commonly known as: PERCOCET/ROXICET Take 1 tablet by mouth every 4 (four) hours as needed for moderate pain.   tamsulosin 0.4 MG Caps capsule Commonly known as: FLOMAX Take 1 capsule (0.4 mg total) by mouth daily.   triamcinolone cream 0.1 % Commonly known as: KENALOG Apply topically bid to aa's of left arm and b/l legs for 2 weeks. After 2 weeks if still rash left can use once daily. Avoid applying to face, groin, and axilla. Use as directed.        Allergies:  Allergies  Allergen Reactions   Erythromycin Hives   Ofloxacin     Other reaction(s): Other (See Comments) "Arthritic pain". The patient reported 5 years of left hip pain when he last took ofloxacin. Likely could try another fluoroquinolone.    Cephalexin Rash    The patient previously tolerated cephalexin in 2013 and  reported in 2019 that he developed redness and itching of his knees while on cephalexin. Not entirely clear it was a true allergic reaction.    Clindamycin Hcl Rash   Doxycycline Calcium Rash   Sulfamethoxazole-Trimethoprim Rash   Tetracycline Rash    Family History: Family History  Problem Relation Age of Onset   Diabetes Father     Social History:  reports that he has never smoked. He has never used smokeless tobacco. He reports that he does not currently use alcohol. No history on file for drug use.   Physical Exam: BP 140/70   Pulse 86   Ht 5\' 9"  (1.753 m)   Wt 216 lb (98 kg)   BMI 31.90 kg/m   Constitutional:  Alert and oriented, No acute distress. HEENT: [place]  AT Respiratory: Normal respiratory effort, no increased work of breathing. Psychiatric: Normal mood and affect.   Assessment & Plan:    1.  Incomplete bladder emptying PVR today 472 mL Asymptomatic Discussed starting a prostate-specific alpha-blocker and/or finasteride to improve bladder emptying though he declined He agreed to periodic monitoring of his PVR and creatinine. Follow-up 6 months with PVR    Abbie Sons, MD  [facility] [address], [place], [place] [address] [phone]

## 2021-09-19 ENCOUNTER — Ambulatory Visit: Payer: Medicare Other | Admitting: Dermatology

## 2021-12-13 ENCOUNTER — Other Ambulatory Visit: Payer: Self-pay | Admitting: Physician Assistant

## 2021-12-13 DIAGNOSIS — R2689 Other abnormalities of gait and mobility: Secondary | ICD-10-CM

## 2021-12-13 DIAGNOSIS — R413 Other amnesia: Secondary | ICD-10-CM

## 2021-12-13 DIAGNOSIS — Z8673 Personal history of transient ischemic attack (TIA), and cerebral infarction without residual deficits: Secondary | ICD-10-CM

## 2021-12-13 DIAGNOSIS — R296 Repeated falls: Secondary | ICD-10-CM

## 2022-01-04 ENCOUNTER — Ambulatory Visit
Admission: RE | Admit: 2022-01-04 | Discharge: 2022-01-04 | Disposition: A | Discharge status: Home / Self Care | Payer: Medicare Other | Source: Ambulatory Visit | Attending: Physician Assistant | Admitting: Physician Assistant

## 2022-01-04 DIAGNOSIS — R413 Other amnesia: Secondary | ICD-10-CM | POA: Insufficient documentation

## 2022-01-04 DIAGNOSIS — R296 Repeated falls: Secondary | ICD-10-CM | POA: Insufficient documentation

## 2022-01-04 DIAGNOSIS — R2689 Other abnormalities of gait and mobility: Secondary | ICD-10-CM | POA: Diagnosis present

## 2022-01-04 DIAGNOSIS — Z8673 Personal history of transient ischemic attack (TIA), and cerebral infarction without residual deficits: Secondary | ICD-10-CM | POA: Insufficient documentation

## 2022-01-04 MED ORDER — GADOPICLENOL 0.5 MMOL/ML IV SOLN
10.0000 mL | Freq: Once | INTRAVENOUS | Status: AC | PRN
  Administered 2022-01-04: 10 mL via INTRAVENOUS

## 2022-03-16 ENCOUNTER — Encounter: Payer: Self-pay | Admitting: Urology

## 2022-03-16 ENCOUNTER — Ambulatory Visit: Payer: Medicare Other | Admitting: Urology

## 2022-03-16 VITALS — BP 140/80 | HR 79 | Ht 66.0 in | Wt 219.0 lb

## 2022-03-16 DIAGNOSIS — N401 Enlarged prostate with lower urinary tract symptoms: Secondary | ICD-10-CM

## 2022-03-16 DIAGNOSIS — R3914 Feeling of incomplete bladder emptying: Secondary | ICD-10-CM

## 2022-03-16 LAB — BLADDER SCAN AMB NON-IMAGING: Scan Result: 436

## 2022-03-16 NOTE — Progress Notes (Signed)
03/16/2022 10:52 AM   Brandon Hooper 08-28-1945 [id number]  Referring provider: Kandyce Rud, MD [phone] [address] [facility] - Family and Internal Medicine [place],  [place] [address]  Chief Complaint  Patient presents with   Benign Prostatic Hypertrophy    HPI:  76 y.o. male who presents 6 month follow-up.  His wife was present at today's visit.  Doing well since last visit No bothersome LUTS Denies dysuria, gross hematuria Denies flank, abdominal or pelvic pain Creatinine/GFR 11/23 was 0.7/>60   PMH: Past Medical History:  Diagnosis Date   Arthritis    Bone loss    Hypertension     Surgical History: Past Surgical History:  Procedure Laterality Date   EYE SURGERY Bilateral    Feb 2006 and then in Jan 2009   IR RADIOLOGIST EVAL & MGMT  05/17/2021    Home Medications:  Allergies as of 03/16/2022       Reactions   Erythromycin Hives   Ofloxacin    Other reaction(s): Other (See Comments) "Arthritic pain". The patient reported 5 years of left hip pain when he last took ofloxacin. Likely could try another fluoroquinolone.    Cephalexin Rash   The patient previously tolerated cephalexin in 2013 and reported in 2019 that he developed redness and itching of his knees while on cephalexin. Not entirely clear it was a true allergic reaction.    Clindamycin Hcl Rash   Doxycycline Calcium Rash   Sulfamethoxazole-trimethoprim Rash   Tetracycline Rash        Medication List        Accurate as of March 16, 2022 10:52 AM. If you have any questions, ask your nurse or doctor.          amLODipine 5 MG tablet Commonly known as: NORVASC Take 1 tablet (5 mg total) by mouth daily.   aspirin 81 MG chewable tablet Chew 1 tablet (81 mg total) by mouth daily.   atorvastatin 40 MG tablet Commonly known as: LIPITOR Take 1 tablet (40 mg total) by mouth daily.   chlorzoxazone 500 MG tablet Commonly known as: PARAFON Take 500 mg by mouth 4 (four) times daily  as needed for muscle spasms (for 10 days).   oxyCODONE-acetaminophen 5-325 MG tablet Commonly known as: PERCOCET/ROXICET Take 1 tablet by mouth every 4 (four) hours as needed for moderate pain.   tamsulosin 0.4 MG Caps capsule Commonly known as: FLOMAX Take 1 capsule (0.4 mg total) by mouth daily.   triamcinolone cream 0.1 % Commonly known as: KENALOG Apply topically bid to aa's of left arm and b/l legs for 2 weeks. After 2 weeks if still rash left can use once daily. Avoid applying to face, groin, and axilla. Use as directed.        Allergies:  Allergies  Allergen Reactions   Erythromycin Hives   Ofloxacin     Other reaction(s): Other (See Comments) "Arthritic pain". The patient reported 5 years of left hip pain when he last took ofloxacin. Likely could try another fluoroquinolone.    Cephalexin Rash    The patient previously tolerated cephalexin in 2013 and reported in 2019 that he developed redness and itching of his knees while on cephalexin. Not entirely clear it was a true allergic reaction.    Clindamycin Hcl Rash   Doxycycline Calcium Rash   Sulfamethoxazole-Trimethoprim Rash   Tetracycline Rash    Family History: Family History  Problem Relation Age of Onset   Diabetes Father     Social  History:  reports that he has never smoked. He has never used smokeless tobacco. He reports that he does not currently use alcohol. No history on file for drug use.   Physical Exam: BP (!) 140/80   Pulse 79   Ht 5\' 6"  (1.676 m)   Wt 219 lb (99.3 kg)   BMI 35.35 kg/m   Constitutional:  Alert and oriented, No acute distress. HEENT: [place] AT Respiratory: Normal respiratory effort, no increased work of breathing. Psychiatric: Normal mood and affect.   Assessment & Plan:    1.  Incomplete bladder emptying PVR today stable at 436 mL Asymptomatic He has declined further management and desires to continue tamsulosin 6 month follow-up with PVR and creatinine Call earlier for  bothersome voiding symptoms    , MD  [facility] [address] [place], [place] [phone]

## 2022-09-17 ENCOUNTER — Ambulatory Visit: Payer: Medicare Other | Admitting: Urology

## 2022-09-20 ENCOUNTER — Ambulatory Visit: Payer: Medicare Other | Admitting: Urology

## 2022-09-26 ENCOUNTER — Ambulatory Visit: Payer: Medicare Other | Admitting: Urology

## 2022-09-26 ENCOUNTER — Encounter: Payer: Self-pay | Admitting: Urology

## 2022-09-26 VITALS — BP 140/79 | HR 84 | Ht 70.0 in | Wt 220.0 lb

## 2022-09-26 DIAGNOSIS — R339 Retention of urine, unspecified: Secondary | ICD-10-CM | POA: Diagnosis not present

## 2022-09-26 DIAGNOSIS — N401 Enlarged prostate with lower urinary tract symptoms: Secondary | ICD-10-CM | POA: Diagnosis not present

## 2022-09-26 LAB — BLADDER SCAN AMB NON-IMAGING: Scan Result: 516

## 2022-09-26 NOTE — Progress Notes (Signed)
I, [name], acting as a scribe for Riki Altes, MD., have documented all relevant documentation on the behalf of Riki Altes, MD, as directed by  Riki Altes, MD while in the presence of Riki Altes, MD.   09/26/2022 1:05 PM   Brandon Hooper 1945/06/22 [id number]  Referring provider: Kandyce Rud, MD [phone] [address] [facility] - Family and Internal Medicine [place],  [place] [address]  Chief Complaint  Patient presents with   Benign Prostatic Hypertrophy    HPI:  77 y.o. male who presents for 6 month follow-up.  His wife was present at today's visit.  Doing well since last visit No bothersome LUTS Denies dysuria, gross hematuria Denies flank, abdominal or pelvic pain His wife states over the last month he has had bedwetting about every other night No bothersome daytime symptoms He has declined management of his incomplete bladder emptying with the exception of taking Tamsulosin   PMH: Past Medical History:  Diagnosis Date   Arthritis    Bone loss    Hypertension     Surgical History: Past Surgical History:  Procedure Laterality Date   EYE SURGERY Bilateral    Feb 2006 and then in Jan 2009   IR RADIOLOGIST EVAL & MGMT  05/17/2021    Home Medications:  Allergies as of 09/26/2022       Reactions   Erythromycin Hives   Ofloxacin    Other reaction(s): Other (See Comments) "Arthritic pain". The patient reported 5 years of left hip pain when he last took ofloxacin. Likely could try another fluoroquinolone.    Cephalexin Rash   The patient previously tolerated cephalexin in 2013 and reported in 2019 that he developed redness and itching of his knees while on cephalexin. Not entirely clear it was a true allergic reaction.    Clindamycin Hcl Rash   Doxycycline Calcium Rash   Sulfamethoxazole-trimethoprim Rash   Tetracycline Rash        Medication List        Accurate as of September 26, 2022  1:05 PM. If you have any questions, ask  your nurse or doctor.          amLODipine 5 MG tablet Commonly known as: NORVASC Take 1 tablet (5 mg total) by mouth daily.   aspirin 81 MG chewable tablet Chew 1 tablet (81 mg total) by mouth daily.   atorvastatin 40 MG tablet Commonly known as: LIPITOR Take 1 tablet (40 mg total) by mouth daily.   chlorzoxazone 500 MG tablet Commonly known as: PARAFON Take 500 mg by mouth 4 (four) times daily as needed for muscle spasms (for 10 days).   oxyCODONE-acetaminophen 5-325 MG tablet Commonly known as: PERCOCET/ROXICET Take 1 tablet by mouth every 4 (four) hours as needed for moderate pain.   tamsulosin 0.4 MG Caps capsule Commonly known as: FLOMAX Take 1 capsule (0.4 mg total) by mouth daily.   triamcinolone cream 0.1 % Commonly known as: KENALOG Apply topically bid to aa's of left arm and b/l legs for 2 weeks. After 2 weeks if still rash left can use once daily. Avoid applying to face, groin, and axilla. Use as directed.        Allergies:  Allergies  Allergen Reactions   Erythromycin Hives   Ofloxacin     Other reaction(s): Other (See Comments) "Arthritic pain". The patient reported 5 years of left hip pain when he last took ofloxacin. Likely could try another fluoroquinolone.    Cephalexin  Rash    The patient previously tolerated cephalexin in 2013 and reported in 2019 that he developed redness and itching of his knees while on cephalexin. Not entirely clear it was a true allergic reaction.    Clindamycin Hcl Rash   Doxycycline Calcium Rash   Sulfamethoxazole-Trimethoprim Rash   Tetracycline Rash    Family History: Family History  Problem Relation Age of Onset   Diabetes Father     Social History:  reports that he has never smoked. He has never used smokeless tobacco. He reports that he does not currently use alcohol. No history on file for drug use.   Physical Exam: BP (!) 140/79   Pulse 84   Ht 5\' 10"  (1.778 m)   Wt 220 lb (99.8 kg)   BMI 31.57 kg/m    Constitutional:  Alert and oriented, No acute distress. HEENT: [place] AT Respiratory: Normal respiratory effort, no increased work of breathing. Psychiatric: Normal mood and affect.   Assessment & Plan:    1.  Incomplete bladder emptying PVR today is slightly higher at 519 mL. We discussed his bedwetting is secondary to overflow incontinence. Discussed in/out catheterization at bedtimem which would most likely resolve the problem, however, he does not desire. Creatinine drawn today. Continue semiannual follow up.   I have reviewed the above documentation for accuracy and completeness, and I agree with the above.   Riki Altes, MD  [facility] [address] [place], [place] [address] [phone]

## 2022-09-27 LAB — CREATININE, SERUM
Creatinine, Ser: 0.83 mg/dL (ref 0.76–1.27)
eGFR: 92 mL/min/{1.73_m2} (ref >59)

## 2022-09-28 ENCOUNTER — Telehealth: Payer: Self-pay | Admitting: Urology

## 2022-09-28 MED ORDER — TAMSULOSIN HCL 0.4 MG PO CAPS: 0.4000 mg | ORAL_CAPSULE | Freq: Every day | ORAL | 2 refills | Status: DC

## 2022-09-28 NOTE — Addendum Note (Signed)
Addended by: Levada Schilling on: 09/28/2022 03:29 PM   Modules accepted: Orders

## 2022-09-28 NOTE — Telephone Encounter (Signed)
I would recommend taking the Flomax because he was emptying better when previously taking.  This may help his nighttime incontinence

## 2022-09-28 NOTE — Telephone Encounter (Addendum)
Notified patient as instructed, patient pleased.Sent in a new refill to [facility]

## 2022-09-28 NOTE — Telephone Encounter (Signed)
Pt's wife forgot to ask a question at appt this week.  He was prescribed Flomas, by another provider, for prostate that pt currently isn't taking.  Pt has been bed wetting this week and she wants to know if voiding at night would be better, if he started these meds again.

## 2023-03-26 ENCOUNTER — Other Ambulatory Visit: Payer: Medicare Other

## 2023-03-26 DIAGNOSIS — N401 Enlarged prostate with lower urinary tract symptoms: Secondary | ICD-10-CM

## 2023-03-27 LAB — CREATININE, SERUM
Creatinine, Ser: 0.94 mg/dL (ref 0.76–1.27)
eGFR: 85 mL/min/{1.73_m2} (ref >59)

## 2023-03-28 ENCOUNTER — Encounter: Payer: Self-pay | Admitting: Urology

## 2023-03-28 ENCOUNTER — Ambulatory Visit: Payer: Medicare Other | Admitting: Urology

## 2023-03-28 VITALS — BP 138/74 | HR 80 | Ht 70.0 in | Wt 220.0 lb

## 2023-03-28 DIAGNOSIS — R339 Retention of urine, unspecified: Secondary | ICD-10-CM

## 2023-03-28 DIAGNOSIS — N401 Enlarged prostate with lower urinary tract symptoms: Secondary | ICD-10-CM | POA: Diagnosis not present

## 2023-03-28 LAB — BLADDER SCAN AMB NON-IMAGING: Scan Result: 269

## 2023-03-28 MED ORDER — TAMSULOSIN HCL 0.4 MG PO CAPS: 0.4000 mg | ORAL_CAPSULE | Freq: Every day | ORAL | 2 refills | Status: DC

## 2023-03-28 NOTE — Progress Notes (Signed)
Brandon Hooper,acting as a scribe for Brandon Altes, MD.,have documented all relevant documentation on the behalf of Brandon Altes, MD,as directed by  Brandon Altes, MD while in the presence of Brandon Altes, MD.  03/28/23 10:55 AM   Brandon Hooper 19-Nov-1945 [id number]  Referring provider: Kandyce Rud, MD [phone] Brandon Hooper [facility] - Family and Internal Medicine [place],  [place] [address]  Chief Complaint  Patient presents with   Benign Prostatic Hypertrophy   Urologic history: 1. BPH with incomplete bladder emptying Tamsulosin 0.4 mg daily  HPI: Brandon Hooper is a 77 y.o. male who presents for a 6 month follow up.   At last visit in 09/2022, he had previously taken Flomax prescribed by another provider,  but had stopped the medication and was having worsening nighttime incontinence. He restarted, and his wife states that he has had resolution of his nighttime incontinence and feels that he is doing much better on the medication.  He has no bothersome lower urinary tract symptoms Denies dysuria, gross hematuria Denies flank, abdominal or pelvic pain  PMH: Past Medical History:  Diagnosis Date   Arthritis    Bone loss    Hypertension     Surgical History: Past Surgical History:  Procedure Laterality Date   EYE SURGERY Bilateral    Feb 2006 and then in Jan 2009   IR RADIOLOGIST EVAL & MGMT  05/17/2021    Home Medications:  Allergies as of 03/28/2023       Reactions   Erythromycin Hives   Ofloxacin    Other reaction(s): Other (See Comments) "Arthritic pain". The patient reported 5 years of left hip pain when he last took ofloxacin. Likely could try another fluoroquinolone.    Cephalexin Rash   The patient previously tolerated cephalexin in 2013 and reported in 2019 that he developed redness and itching of his knees while on cephalexin. Not entirely clear it was a true allergic reaction.    Clindamycin Hcl Rash   Doxycycline Calcium Rash    Sulfamethoxazole-trimethoprim Rash   Tetracycline Rash        Medication List        Accurate as of March 28, 2023 10:55 AM. If you have any questions, ask your nurse or doctor.          STOP taking these medications    amLODipine 5 MG tablet Commonly known as: NORVASC   aspirin 81 MG chewable tablet   atorvastatin 40 MG tablet Commonly known as: LIPITOR   chlorzoxazone 500 MG tablet Commonly known as: PARAFON   oxyCODONE-acetaminophen 5-325 MG tablet Commonly known as: PERCOCET/ROXICET   triamcinolone cream 0.1 % Commonly known as: KENALOG       TAKE these medications    tamsulosin 0.4 MG Caps capsule Commonly known as: FLOMAX Take 1 capsule (0.4 mg total) by mouth daily.        Allergies:  Allergies  Allergen Reactions   Erythromycin Hives   Ofloxacin     Other reaction(s): Other (See Comments) "Arthritic pain". The patient reported 5 years of left hip pain when he last took ofloxacin. Likely could try another fluoroquinolone.    Cephalexin Rash    The patient previously tolerated cephalexin in 2013 and reported in 2019 that he developed redness and itching of his knees while on cephalexin. Not entirely clear it was a true allergic reaction.    Clindamycin Hcl Rash   Doxycycline Calcium Rash   Sulfamethoxazole-Trimethoprim Rash  Tetracycline Rash    Family History: Family History  Problem Relation Age of Onset   Diabetes Father     Social History:  reports that he has never smoked. He has never used smokeless tobacco. He reports that he does not currently use alcohol. No history on file for drug use.   Physical Exam: BP 138/74   Pulse 80   Ht 5\' 10"  (1.778 m)   Wt 220 lb (99.8 kg)   BMI 31.57 kg/m   Constitutional:  Alert and oriented, No acute distress. HEENT: [place] AT Psychiatric: Normal mood and affect.  Laboratory Data:  Creatinine: 03/26/2023: 0.94  Assessment & Plan:    1. BPH with incomplete bladder emptying PVR today  significantly improved at 269 mL Tamsulosin refilled Follow up in 1 year with PVR.  Instructed to call earlier for worsening voiding symptoms  I have reviewed the above documentation for accuracy and completeness, and I agree with the above.   Brandon Altes, MD  [facility] [address] [place], [place] [address] [phone]

## 2023-07-22 ENCOUNTER — Telehealth: Payer: Self-pay

## 2023-07-22 NOTE — Telephone Encounter (Signed)
 Patient wife called concerned patient is having a reaction to tamsulosin.  Rash and hives started Friday on upper thigh and genital area.  Started tamsulosin last June.  Stopped the medication and rash is getting better.  They are happy with how the medication has helped but concerned about the rash.  Please advise.

## 2023-07-23 ENCOUNTER — Telehealth: Payer: Self-pay

## 2023-07-23 MED ORDER — ALFUZOSIN HCL ER 10 MG PO TB24: 10.0000 mg | ORAL_TABLET | Freq: Every day | ORAL | 1 refills | Status: AC

## 2023-07-23 NOTE — Telephone Encounter (Signed)
 Notified patient as instructed, patient pleased

## 2023-07-23 NOTE — Telephone Encounter (Signed)
 Message left on triage line by wife Devra Fontana-  She is requesting an update from yesterday??   See telephone encounter?    LMTRC. Need more information.

## 2024-03-27 ENCOUNTER — Ambulatory Visit: Payer: Self-pay | Admitting: Urology

## 2024-04-22 ENCOUNTER — Ambulatory Visit: Admitting: Urology

## 2024-04-22 ENCOUNTER — Encounter: Payer: Self-pay | Admitting: Urology

## 2024-04-22 VITALS — BP 140/79 | HR 94 | Ht 72.0 in | Wt 220.0 lb

## 2024-04-22 DIAGNOSIS — N401 Enlarged prostate with lower urinary tract symptoms: Secondary | ICD-10-CM

## 2024-04-22 DIAGNOSIS — R3914 Feeling of incomplete bladder emptying: Secondary | ICD-10-CM | POA: Diagnosis not present

## 2024-04-22 NOTE — Progress Notes (Signed)
 "  04/22/2024 5:35 PM   Brandon Hooper [birth year] [id number]  Referring provider: Diedra Lame, MD [phone] [address]. [address] Mulligan [facility] - Family and Internal Medicine [place],  [place] [address]  Chief Complaint  Patient presents with   Benign Prostatic Hypertrophy   Urologic history: 1. BPH with incomplete bladder emptying Tamsulosin  0.4 mg daily initially Discontinued April 2025 secondary to rash Refuses surgical management   HPI: Brandon Hooper is a 79 y.o. male presents for annual follow-up.  His wife called April 2025 stating he had developed a rash in his thigh region.  The initial note indicates rash and hives however his wife states that it was redness and no hives and she is now unsure if this was related to the medication He was started on alfuzosin  however they only took for ~2 weeks then discontinued although he was not having any side effects He has no complaints Urologic review of systems today is negative   PMH: Past Medical History:  Diagnosis Date   Arthritis    Bone loss    Hypertension     Surgical History: Past Surgical History:  Procedure Laterality Date   EYE SURGERY Bilateral    Feb 2006 and then in Jan 2009   IR RADIOLOGIST EVAL & MGMT  05/17/2021    Home Medications:  Allergies as of 04/22/2024       Reactions   Atorvastatin  Dermatitis   Erythromycin Hives   Ofloxacin    Other reaction(s): Other (See Comments) Arthritic pain. The patient reported 5 years of left hip pain when he last took ofloxacin. Likely could try another fluoroquinolone.    Cephalexin Rash   The patient previously tolerated cephalexin in 2013 and reported in 2019 that he developed redness and itching of his knees while on cephalexin. Not entirely clear it was a true allergic reaction.    Clindamycin Hcl Rash   Doxycycline Calcium  Rash   Sulfamethoxazole-trimethoprim Rash   Tamsulosin  Hives, Rash   Tetracycline Rash        Medication List        Accurate  as of April 22, 2024  5:35 PM. If you have any questions, ask your nurse or doctor.          alfuzosin  10 MG 24 hr tablet Commonly known as: UROXATRAL  Take 1 tablet (10 mg total) by mouth daily with breakfast.        Allergies: Allergies[1]  Family History: Family History  Problem Relation Age of Onset   Diabetes Father     Social History:  reports that he has never smoked. He has never used smokeless tobacco. He reports that he does not currently use alcohol. No history on file for drug use.   Physical Exam: BP (!) 140/79   Pulse 94   Ht 6' (1.829 m)   Wt 220 lb (99.8 kg)   BMI 29.84 kg/m   Constitutional:  Alert, No acute distress. HEENT: [place] AT Respiratory: Normal respiratory effort, no increased work of breathing. Psychiatric: Normal mood and affect.   Assessment & Plan:    1. Benign prostatic hyperplasia with incomplete bladder emptying PVR today was 467 mL Since being seen his PVR has varied from 269-569 mL We discussed retrying the tamsulosin  or alfuzosin .  A trial of a prostate-specific alpha-blocker was also discussed He was unsure what he wanted to do and initially wants to repeat his bladder scan and will schedule a follow-up bladder scan in approximately 1 month.  If PVR remains above  300 mL he will restart an alpha-blocker   Mykaela Arena C Berlie Persky, MD  [facility]  [address] [place], [place] [address] [phone]     [1]  Allergies Allergen Reactions   Atorvastatin  Dermatitis   Erythromycin Hives   Ofloxacin     Other reaction(s): Other (See Comments) Arthritic pain. The patient reported 5 years of left hip pain when he last took ofloxacin. Likely could try another fluoroquinolone.    Cephalexin Rash    The patient previously tolerated cephalexin in 2013 and reported in 2019 that he developed redness and itching of his knees while on cephalexin. Not entirely clear it was a true allergic reaction.     Clindamycin Hcl Rash   Doxycycline Calcium  Rash   Sulfamethoxazole-Trimethoprim Rash   Tamsulosin  Hives and Rash   Tetracycline Rash   "

## 2024-04-23 ENCOUNTER — Encounter: Payer: Self-pay | Admitting: Urology

## 2024-04-23 LAB — BLADDER SCAN AMB NON-IMAGING: Scan Result: 457

## 2024-05-18 ENCOUNTER — Ambulatory Visit

## 2024-05-22 ENCOUNTER — Ambulatory Visit

## 2024-07-09 ENCOUNTER — Ambulatory Visit
